# Patient Record
Sex: Female | Born: 1970 | Race: Black or African American | Hispanic: No | Marital: Married | Smoking: Never smoker
Health system: Southern US, Community
[De-identification: ages and names within clinical notes are randomized; demographics above are authoritative.]

## PROBLEM LIST (undated history)

## (undated) DIAGNOSIS — K219 Gastro-esophageal reflux disease without esophagitis: Secondary | ICD-10-CM

## (undated) DIAGNOSIS — I499 Cardiac arrhythmia, unspecified: Secondary | ICD-10-CM

## (undated) DIAGNOSIS — Z9889 Other specified postprocedural states: Secondary | ICD-10-CM

## (undated) DIAGNOSIS — R112 Nausea with vomiting, unspecified: Secondary | ICD-10-CM

## (undated) DIAGNOSIS — IMO0001 Reserved for inherently not codable concepts without codable children: Secondary | ICD-10-CM

## (undated) DIAGNOSIS — R000 Tachycardia, unspecified: Secondary | ICD-10-CM

## (undated) DIAGNOSIS — E785 Hyperlipidemia, unspecified: Secondary | ICD-10-CM

## (undated) DIAGNOSIS — I2699 Other pulmonary embolism without acute cor pulmonale: Secondary | ICD-10-CM

## (undated) DIAGNOSIS — Z86711 Personal history of pulmonary embolism: Secondary | ICD-10-CM

## (undated) DIAGNOSIS — R011 Cardiac murmur, unspecified: Secondary | ICD-10-CM

## (undated) DIAGNOSIS — F419 Anxiety disorder, unspecified: Secondary | ICD-10-CM

## (undated) DIAGNOSIS — D219 Benign neoplasm of connective and other soft tissue, unspecified: Secondary | ICD-10-CM

## (undated) DIAGNOSIS — J449 Chronic obstructive pulmonary disease, unspecified: Secondary | ICD-10-CM

## (undated) DIAGNOSIS — Z8759 Personal history of other complications of pregnancy, childbirth and the puerperium: Secondary | ICD-10-CM

## (undated) DIAGNOSIS — R519 Headache, unspecified: Secondary | ICD-10-CM

## (undated) DIAGNOSIS — T753XXA Motion sickness, initial encounter: Secondary | ICD-10-CM

## (undated) DIAGNOSIS — I471 Supraventricular tachycardia, unspecified: Secondary | ICD-10-CM

## (undated) DIAGNOSIS — R51 Headache: Secondary | ICD-10-CM

## (undated) DIAGNOSIS — D649 Anemia, unspecified: Secondary | ICD-10-CM

## (undated) DIAGNOSIS — Z90710 Acquired absence of both cervix and uterus: Secondary | ICD-10-CM

## (undated) HISTORY — DX: Personal history of other complications of pregnancy, childbirth and the puerperium: Z87.59

## (undated) HISTORY — PX: CHOLECYSTECTOMY: SHX55

## (undated) HISTORY — DX: Acquired absence of both cervix and uterus: Z90.710

## (undated) HISTORY — DX: Chronic obstructive pulmonary disease, unspecified: J44.9

## (undated) HISTORY — PX: NISSEN FUNDOPLICATION: SHX2091

## (undated) HISTORY — DX: Personal history of pulmonary embolism: Z86.711

## (undated) HISTORY — DX: Tachycardia, unspecified: R00.0

## (undated) HISTORY — PX: TUBAL LIGATION: SHX77

## (undated) HISTORY — DX: Gastro-esophageal reflux disease without esophagitis: K21.9

## (undated) HISTORY — DX: Benign neoplasm of connective and other soft tissue, unspecified: D21.9

## (undated) HISTORY — DX: Hyperlipidemia, unspecified: E78.5

## (undated) HISTORY — DX: Other specified postprocedural states: Z98.890

## (undated) HISTORY — DX: Cardiac murmur, unspecified: R01.1

## (undated) HISTORY — PX: CARDIAC ELECTROPHYSIOLOGY STUDY AND ABLATION: SHX1294

## (undated) HISTORY — PX: APPENDECTOMY: SHX54

---

## 1998-06-02 DIAGNOSIS — I2699 Other pulmonary embolism without acute cor pulmonale: Secondary | ICD-10-CM

## 1998-06-02 HISTORY — DX: Other pulmonary embolism without acute cor pulmonale: I26.99

## 2011-06-03 HISTORY — PX: COLONOSCOPY: SHX174

## 2014-03-28 DIAGNOSIS — K449 Diaphragmatic hernia without obstruction or gangrene: Secondary | ICD-10-CM

## 2014-03-28 DIAGNOSIS — K219 Gastro-esophageal reflux disease without esophagitis: Secondary | ICD-10-CM | POA: Insufficient documentation

## 2014-03-28 DIAGNOSIS — E782 Mixed hyperlipidemia: Secondary | ICD-10-CM | POA: Insufficient documentation

## 2014-12-14 DIAGNOSIS — Z9889 Other specified postprocedural states: Secondary | ICD-10-CM

## 2014-12-14 HISTORY — DX: Other specified postprocedural states: Z98.890

## 2015-05-09 ENCOUNTER — Ambulatory Visit (INDEPENDENT_AMBULATORY_CARE_PROVIDER_SITE_OTHER): Payer: BLUE CROSS/BLUE SHIELD | Admitting: Obstetrics and Gynecology

## 2015-05-09 ENCOUNTER — Encounter: Payer: Self-pay | Admitting: Obstetrics and Gynecology

## 2015-05-09 VITALS — BP 108/68 | HR 74 | Ht 66.0 in | Wt 199.9 lb

## 2015-05-09 DIAGNOSIS — Z98891 History of uterine scar from previous surgery: Secondary | ICD-10-CM

## 2015-05-09 DIAGNOSIS — Z86711 Personal history of pulmonary embolism: Secondary | ICD-10-CM

## 2015-05-09 DIAGNOSIS — N841 Polyp of cervix uteri: Secondary | ICD-10-CM | POA: Diagnosis not present

## 2015-05-09 DIAGNOSIS — N939 Abnormal uterine and vaginal bleeding, unspecified: Secondary | ICD-10-CM

## 2015-05-09 DIAGNOSIS — Z8759 Personal history of other complications of pregnancy, childbirth and the puerperium: Secondary | ICD-10-CM

## 2015-05-09 DIAGNOSIS — O3430 Maternal care for cervical incompetence, unspecified trimester: Secondary | ICD-10-CM | POA: Diagnosis not present

## 2015-05-09 DIAGNOSIS — D259 Leiomyoma of uterus, unspecified: Secondary | ICD-10-CM | POA: Diagnosis not present

## 2015-05-09 NOTE — Patient Instructions (Signed)
1. Endometrial biopsy is done today 2. Endocervical polyp is removed today 3. Pelvic ultrasound is ordered. 4. Return in 1 week after her ultrasound for further management planning 5. Take Tylenol as needed for cramps

## 2015-05-09 NOTE — Progress Notes (Signed)
Patient ID: Brittany Baldwin, female   DOB: August 29, 1970, 44 y.o.   MRN: [id number] np - spotting in between cycle   cycles are q 24 days, lasting 3d, no day is heavy S/p novasure ablation- 2013 H/o fibroids Pelvic pain comes and goes  GYN ENCOUNTER NOTE  Subjective:       Brittany Baldwin is a 44 y.o. [phone] female is here for gynecologic evaluation of the following issues:  1. Abnormal uterine bleeding (Intramenstrual bleeding-spotting)  2. History of uterine fibroids 3. Pelvic cramping 4. History of NovaSure ablation 2013  Patient is a 44 year old married African-American female para 1251, with history of uterine fibroids, history of uterine ablation in 2013 with NovaSure, status post BTL for contraception, presents for evaluation abnormal uterine bleeding and pelvic pain.  Past GYN history: Menarche age 59. Intervals R every 24 days Duration of flow 3 days Intramenstrual spotting present. Pelvic cramping is intermittent throughout cycle History of deep thrusting dyspareunia. Pelvic pain description: Random cramping throughout cycle. Improvement occurs with lying down or use of heating pad. Tylenol helps. Patient unable to take nonsteroidal anti-inflammatory medications. History of postcoital bleeding and deep thrusting dyspareunia. No history of abnormal Pap smears History of McDonald's cerclage 4 and Shirodkar cerclage 1; Shirodkar cerclage remains intact Past history of pulmonary embolus during pregnancy which required bed rest due to incompetent cervix   Obstetric History OB History  Gravida Para Term Preterm AB SAB TAB Ectopic Multiple Living  8 3 1 2 5 4 1   1     # Outcome Date GA Lbr Len/2nd Weight Sex Delivery Anes PTL Lv  8 SAB 2003          7 Preterm 2001        FD  6 Term 1998   4 lb 2.1 oz (1.873 kg) F Vag-Spont   Y  5 Preterm 1996        FD  4 SAB 1994          3 SAB 1992          2 TAB 1991          1 SAB 1990              Past Medical History  Diagnosis  Date  . Tachycardia   . Hyperlipemia   . GERD (gastroesophageal reflux disease)   . Fibroid     Past Surgical History  Procedure Laterality Date  . Tubal ligation    . Cesarean section    . Appendectomy    . Cholecystectomy    . Cervical cerclage    . Nissen fundoplication    . Fibroids removed    . Novasure ablation      No current outpatient prescriptions on file prior to visit.   No current facility-administered medications on file prior to visit.    Allergies  Allergen Reactions  . Codeine Shortness Of Breath  . Hydrocodone-Acetaminophen Rash  . Aspirin Other (See Comments)    Stomach issues  . Diphenhydramine Hcl Other (See Comments)    Through an I.V. drip    Social History   Social History  . Marital Status: Married    Spouse Name: N/A  . Number of Children: N/A  . Years of Education: N/A   Occupational History  . Not on file.   Social History Main Topics  . Smoking status: Never Smoker   . Smokeless tobacco: Not on file  . Alcohol Use: No  . Drug Use: No  .  Sexual Activity: Yes    Birth Control/ Protection: Surgical   Other Topics Concern  . Not on file   Social History Narrative  . No narrative on file    Family History  Problem Relation Age of Onset  . Diabetes Mother   . Prostate cancer Father   . Congestive Heart Failure Father   . Cancer Neg Hx     The following portions of the patient's history were reviewed and updated as appropriate: allergies, current medications, past family history, past medical history, past social history, past surgical history and problem list.  Review of Systems Review of Systems - General ROS: negative for - chills, fatigue, fever, hot flashes, malaise or night sweats Hematological and Lymphatic ROS: negative for - bleeding problems or swollen lymph nodes Gastrointestinal ROS: negative for - , blood in stools, change in bowel habits and nausea/vomiting. POSITIVE-abdominal pain Musculoskeletal ROS:  negative for - joint pain, muscle pain or muscular weakness Genito-Urinary ROS: negative for - change in menstrual cycle, dysmenorrhea, , dysuria, genital discharge, genital ulcers, hematuria, incontinence, nocturia or pelvic pain.  POSITIVE-dyspareunia, irregular menses,,   Objective:   BP 108/68 mmHg  Pulse 74  Ht 5\' 6"  (1.676 m)  Wt 199 lb 14.4 oz (90.674 kg)  BMI 32.28 kg/m2  LMP 04/21/2015 (Exact Date) CONSTITUTIONAL: Well-developed, well-nourished female in no acute distress.  HENT:  Normocephalic, atraumatic.  NECK: Normal range of motion, supple, no masses.  Normal thyroid.  SKIN: Skin is warm and dry. No rash noted. Not diaphoretic. No erythema. No pallor. NEUROLGIC: Alert and oriented to person, place, and time. PSYCHIATRIC: Normal mood and affect. Normal behavior. Normal judgment and thought content. CARDIOVASCULAR:Not Examined RESPIRATORY: Not Examined BREASTS: Not Examined ABDOMEN: Soft, non distended; Non tender.  No Organomegaly. PELVIC:  External Genitalia: Normal  BUS: Normal  Vagina: Normal  Cervix: Fleshy endocervical polyp 7 mm (removed); irregular scarred cervix without cervical motion tenderness  Uterus: Top Normal size, slightly tender 2/4, mobile  Adnexa: Normal  RV: Normal external exam  Bladder: Nontender MUSCULOSKELETAL: Normal range of motion. No tenderness.  No cyanosis, clubbing, or edema.  PROCEDURES: 1.  Cervical biopsy (endocervical polyp removed with Tischler forceps). 2.  Endometrial biopsy-see note below  Endometrial Biopsy Procedure Note  Pre-operative Diagnosis: Abnormal uterine bleeding  Post-operative Diagnosis: same  Procedure Details   Urine pregnancy test was not done.  The risks (including infection, bleeding, pain, and uterine perforation) and benefits of the procedure were explained to the patient and Verbal informed consent was obtained.  Antibiotic prophylaxis against endocarditis was not indicated.   The patient was placed  in the dorsal lithotomy position.  Bimanual exam showed the uterus to be in the neutral position.  A Graves' speculum inserted in the vagina, and the cervix prepped with povidone iodine.  Endocervical curettage with a Kevorkian curette was not performed.   A sharp tenaculum was not  applied to the anterior lip of the cervix for stabilization.  A sterile uterine sound was used to sound the uterus to a depth of 6cm.  A Mylex 3mm curette was used to sample the endometrium.  Sample was sent for pathologic examination.  Condition: Stable  Complications: None  Plan:  The patient was advised to call for any fever or for prolonged or severe pain or bleeding. She was advised to use OTC acetaminophen as needed for mild to moderate pain. She was advised to avoid vaginal intercourse for 48 hours or until the bleeding has completely stopped.  Attending Physician Documentation: Herold Harms, MD             Assessment:  1. Abnormal uterine bleeding  - US Pelvis Complete; Future - US Transvaginal Non-OB; Future - Pathology  2. Endocervical polyp   - Pathology  3. Uterine leiomyoma, unspecified location  4.  Complicated gynecologic history with 4 McDonald's cerclages in the past and Shirodkar cerclage, which is still intact.  5.  History of pulmonary embolus during pregnancy while on prolonged bedrest due to cervical incompetence and history of premature birth.  Not a candidate for hormonal therapy to regulate bleeding       Plan:   1. Endometrial biopsy performed 2. Cervical biopsy for removal of endocervical polyp 3. Pelvic ultrasound 4. Return in 1 week after ultrasound for further management planning 5. Options of management for abnormal uterine bleeding and pelvic cramping were reviewed. Patient understands that she is not a candidate for hormone therapy due to her past history of pulmonary embolus during pregnancy. It appears that her only optimal intervention  would be hysterectomy. This will be addressed once all information is obtained from testing.  Brittany Docker Janai Maudlin, MD  A total of 45 minutes were spent face-to-face with the patient during the encounter with greater than 50% dealing with counseling and coordination of care.

## 2015-05-10 ENCOUNTER — Encounter: Payer: Self-pay | Admitting: Obstetrics and Gynecology

## 2015-05-10 DIAGNOSIS — D219 Benign neoplasm of connective and other soft tissue, unspecified: Secondary | ICD-10-CM | POA: Insufficient documentation

## 2015-05-10 DIAGNOSIS — R000 Tachycardia, unspecified: Secondary | ICD-10-CM | POA: Insufficient documentation

## 2015-05-10 DIAGNOSIS — Z98891 History of uterine scar from previous surgery: Secondary | ICD-10-CM | POA: Insufficient documentation

## 2015-05-10 DIAGNOSIS — O3430 Maternal care for cervical incompetence, unspecified trimester: Secondary | ICD-10-CM | POA: Insufficient documentation

## 2015-05-10 DIAGNOSIS — N939 Abnormal uterine and vaginal bleeding, unspecified: Secondary | ICD-10-CM | POA: Insufficient documentation

## 2015-05-11 LAB — PATHOLOGY

## 2015-05-14 LAB — PATHOLOGY

## 2015-05-16 ENCOUNTER — Encounter: Payer: Self-pay | Admitting: Obstetrics and Gynecology

## 2015-05-16 ENCOUNTER — Ambulatory Visit (INDEPENDENT_AMBULATORY_CARE_PROVIDER_SITE_OTHER): Payer: BLUE CROSS/BLUE SHIELD

## 2015-05-16 ENCOUNTER — Ambulatory Visit (INDEPENDENT_AMBULATORY_CARE_PROVIDER_SITE_OTHER): Payer: BLUE CROSS/BLUE SHIELD | Admitting: Obstetrics and Gynecology

## 2015-05-16 VITALS — BP 122/75 | HR 71 | Ht 66.0 in | Wt 196.0 lb

## 2015-05-16 DIAGNOSIS — O3430 Maternal care for cervical incompetence, unspecified trimester: Secondary | ICD-10-CM

## 2015-05-16 DIAGNOSIS — D259 Leiomyoma of uterus, unspecified: Secondary | ICD-10-CM | POA: Diagnosis not present

## 2015-05-16 DIAGNOSIS — N841 Polyp of cervix uteri: Secondary | ICD-10-CM

## 2015-05-16 DIAGNOSIS — N939 Abnormal uterine and vaginal bleeding, unspecified: Secondary | ICD-10-CM

## 2015-05-16 DIAGNOSIS — N949 Unspecified condition associated with female genital organs and menstrual cycle: Secondary | ICD-10-CM | POA: Diagnosis not present

## 2015-05-16 DIAGNOSIS — G8929 Other chronic pain: Secondary | ICD-10-CM

## 2015-05-16 DIAGNOSIS — Z8759 Personal history of other complications of pregnancy, childbirth and the puerperium: Secondary | ICD-10-CM

## 2015-05-16 DIAGNOSIS — Z86711 Personal history of pulmonary embolism: Secondary | ICD-10-CM

## 2015-05-16 DIAGNOSIS — R102 Pelvic and perineal pain: Secondary | ICD-10-CM

## 2015-05-16 HISTORY — DX: Personal history of other complications of pregnancy, childbirth and the puerperium: Z87.59

## 2015-05-16 HISTORY — DX: Personal history of pulmonary embolism: Z86.711

## 2015-05-16 NOTE — Patient Instructions (Signed)
1.  Options of management were reviewed: Expectant observation; trial of Depo-Lupron therapy; TAH, bilateral salpingectomy. 2.  Patient is to contact us regarding her decision for therapy.  She will discuss options with her husband and family before contacting us.

## 2015-05-16 NOTE — Progress Notes (Signed)
Patient ID: Brittany Baldwin, female   DOB: March 14, 1971, 44 y.o.   MRN: [id number] Results- u/s,emb, and polyp removed  Chief complaint: 1.   Management planning conference. 2.  Uterine fibroids. 3.  Abnormal uterine bleeding. 4.  Status post endometrial biopsy and cervical polyp removal. 5.  History of ablation. 6.  History of multiple abdominal surgeries, (McDonald cerclage 4, Shirodkar cerclage 1, hysterotomy for fetal demise-midline, and low transverse cesarean section. 7.  Chronic pelvic pain 8.  History of pulmonary embolus; not a hormonal therapy candidate  Patient presents for follow-up on above issues. Endometrial biopsy: Benign endometrium.  No evidence of hyperplasia or carcinoma. Cervical biopsy: Endocervical polyp. Ultrasound: Multiple uterine fibroids (6); endometrial stripe obscured by fibroids.  Options of management were discussed For dealing with abnormal uterine bleeding, polymenorrhea, and chronic pelvic pain including deep thrusting dyspareunia; NOT a hormonal therapy candidate. 1.  Trial of Depo-Lupron for bleeding suppression, pain suppression, and fibroid shrinkage. 2.  TAH, bilateral salpingectomy. 3.  Expectant observation until menopause with hopeful resolution of symptomatology.  Patient will contemplate options, discuss alternatives with spouse, and she will contact us with her decision.  At this time she is leaning towards probable surgery.  A total of 25 minutes were spent face-to-face with the patient during this encounter and over half of that time involved counseling and coordination of care.  Brayton Mars, MD

## 2015-06-25 ENCOUNTER — Encounter: Payer: Self-pay | Admitting: *Deleted

## 2015-06-25 ENCOUNTER — Inpatient Hospital Stay: Admission: RE | Admit: 2015-06-25 | Payer: Self-pay | Source: Ambulatory Visit

## 2015-06-25 DIAGNOSIS — I471 Supraventricular tachycardia: Secondary | ICD-10-CM | POA: Diagnosis not present

## 2015-06-25 NOTE — Patient Instructions (Signed)
  Your procedure is scheduled on: 07-02-15 (MONDAY) Report to [facility] To find out your arrival time please call [phone] between 1PM - 3PM on 06-29-15 (FRIDAY)  Remember: Instructions that are not followed completely may result in serious medical risk, up to and including death, or upon the discretion of your surgeon and anesthesiologist your surgery may need to be rescheduled.    _X___ 1. Do not eat food or drink liquids after midnight. No gum chewing or hard candies.     _X___ 2. No Alcohol for 24 hours before or after surgery.   ____ 3. Bring all medications with you on the day of surgery if instructed.    _X___ 4. Notify your doctor if there is any change in your medical condition     (cold, fever, infections).     Do not wear jewelry, make-up, hairpins, clips or nail polish.  Do not wear lotions, powders, or perfumes. You may wear deodorant.  Do not shave 48 hours prior to surgery. Men may shave face and neck.  Do not bring valuables to the hospital.    [facility] is not responsible for any belongings or valuables.               Contacts, dentures or bridgework may not be worn into surgery.  Leave your suitcase in the car. After surgery it may be brought to your room.  For patients admitted to the hospital, discharge time is determined by your treatment team.   Patients discharged the day of surgery will not be allowed to drive home.   Please read over the following fact sheets that you were given:    _X___ Take these medicines the morning of surgery with A SIP OF WATER:    1. PROPRANOLOL  2.   3.   4.  5.  6.  ____ Fleet Enema (as directed)   _X___ Use CHG Soap as directed  ____ Use inhalers on the day of surgery  ____ Stop metformin 2 days prior to surgery    ____ Take 1/2 of usual insulin dose the night before surgery and none on the morning of surgery.   ____ Stop Coumadin/Plavix/aspirin-N/A  ____ Stop  Anti-inflammatories-NO NSAIDS OR ASPIRIN PRODUCTS-TYLENOL OK TO TAKE   _X___ Stop supplements until after surgery-STOP BIOTIN NOW  ____ Bring C-Pap to the hospital.

## 2015-06-26 ENCOUNTER — Encounter: Payer: Self-pay | Admitting: Obstetrics and Gynecology

## 2015-06-26 ENCOUNTER — Ambulatory Visit (INDEPENDENT_AMBULATORY_CARE_PROVIDER_SITE_OTHER): Payer: BLUE CROSS/BLUE SHIELD | Admitting: Obstetrics and Gynecology

## 2015-06-26 ENCOUNTER — Encounter
Admission: RE | Admit: 2015-06-26 | Discharge: 2015-06-26 | Disposition: A | Discharge status: Home / Self Care | Payer: BLUE CROSS/BLUE SHIELD | Source: Ambulatory Visit | Attending: Obstetrics and Gynecology | Admitting: Obstetrics and Gynecology

## 2015-06-26 VITALS — BP 122/74 | HR 78 | Ht 66.0 in | Wt 200.9 lb

## 2015-06-26 DIAGNOSIS — Z01812 Encounter for preprocedural laboratory examination: Secondary | ICD-10-CM | POA: Insufficient documentation

## 2015-06-26 DIAGNOSIS — O3430 Maternal care for cervical incompetence, unspecified trimester: Secondary | ICD-10-CM

## 2015-06-26 DIAGNOSIS — D259 Leiomyoma of uterus, unspecified: Secondary | ICD-10-CM

## 2015-06-26 DIAGNOSIS — Z01818 Encounter for other preprocedural examination: Secondary | ICD-10-CM | POA: Insufficient documentation

## 2015-06-26 DIAGNOSIS — Z98891 History of uterine scar from previous surgery: Secondary | ICD-10-CM

## 2015-06-26 DIAGNOSIS — G8929 Other chronic pain: Secondary | ICD-10-CM

## 2015-06-26 DIAGNOSIS — N949 Unspecified condition associated with female genital organs and menstrual cycle: Secondary | ICD-10-CM

## 2015-06-26 DIAGNOSIS — R102 Pelvic and perineal pain: Secondary | ICD-10-CM

## 2015-06-26 DIAGNOSIS — N939 Abnormal uterine and vaginal bleeding, unspecified: Secondary | ICD-10-CM

## 2015-06-26 LAB — CBC WITH DIFFERENTIAL/PLATELET
BASOS PCT: 1 %
Basophils Absolute: 0.1 10*3/uL (ref 0–0.1)
EOS ABS: 0.1 10*3/uL (ref 0–0.7)
EOS PCT: 1 %
HCT: 39.5 % (ref 35.0–47.0)
Hemoglobin: 13 g/dL (ref 12.0–16.0)
Lymphocytes Relative: 30 %
Lymphs Abs: 1.9 10*3/uL (ref 1.0–3.6)
MCH: 29.1 pg (ref 26.0–34.0)
MCHC: 33 g/dL (ref 32.0–36.0)
MCV: 88 fL (ref 80.0–100.0)
MONO ABS: 0.8 10*3/uL (ref 0.2–0.9)
MONOS PCT: 12 %
Neutro Abs: 3.6 10*3/uL (ref 1.4–6.5)
Neutrophils Relative %: 56 %
PLATELETS: 191 10*3/uL (ref 150–440)
RBC: 4.49 MIL/uL (ref 3.80–5.20)
RDW: 13.9 % (ref 11.5–14.5)
WBC: 6.4 10*3/uL (ref 3.6–11.0)

## 2015-06-26 LAB — RAPID HIV SCREEN (HIV 1/2 AB+AG)
HIV 1/2 ANTIBODIES: NONREACTIVE
HIV-1 P24 ANTIGEN - HIV24: NONREACTIVE

## 2015-06-26 LAB — TYPE AND SCREEN
ABO/RH(D): A POS
ANTIBODY SCREEN: NEGATIVE

## 2015-06-26 LAB — ABO/RH: ABO/RH(D): A POS

## 2015-06-26 NOTE — H&P (Signed)
Subjective:    Patient is a 45 y.o. G8P1270female scheduled for Total abdominal hysterectomy and bilateral salpingectomy. Indications for procedure are Abnormal uterine bleeding; chronic pelvic pain.   Pertinent Gynecological History: History of multiple gynecologic surgeries including: 1.  McDonald cerclage 4. 2.  Shirodkar cerclage. 3.  Hysterotomy for UFD. 4.  Cesarean section delivery 5.  BTL. 6.  NovaSure endometrial ablation  Menses: Irregular  Discussed Blood/Blood Products: yes   Menstrual History: OB History    Gravida Para Term Preterm AB TAB SAB Ectopic Multiple Living   8 3 1 2 5 1 4   1       Menarche age: NA  Patient's last menstrual period was 06/26/2015 (exact date).    Past Medical History  Diagnosis Date  . Tachycardia   . Hyperlipemia   . GERD (gastroesophageal reflux disease)   . Fibroid   . Headache     H/O  . Anemia     H/O   . Dysrhythmia     SVT-HAD CARDIAC ABLATION DONE IN 2016 BUT PT STATES SHE STILL GETS TACHYCARDIC AND IS SYPMPTOMATIC WITH SOB, DIZZINESS DURING TACHY EPISODES (06-25-15)  . Shortness of breath dyspnea     ASSOCIATED WITH TACHYCARDIA    Past Surgical History  Procedure Laterality Date  . Tubal ligation    . Cesarean section    . Appendectomy    . Cholecystectomy    . Cervical cerclage      McDonald cerclage 4; Shirodkar cerclage 1  . Nissen fundoplication    . Fibroids removed    . Novasure ablation    . Dilation and curettage of uterus    . Cardiac electrophysiology study and ablation      OB History  Gravida Para Term Preterm AB SAB TAB Ectopic Multiple Living  8 3 1 2 5 4 1   1     # Outcome Date GA Lbr Len/2nd Weight Sex Delivery Anes PTL Lv  8 SAB 2003          7 Preterm 2001        FD  6 Term 1998   4 lb 2.1 oz (1.873 kg) F Vag-Spont   Y  5 Preterm 1996        FD  4 SAB 1994          3 SAB 1992          2 TAB 1991          1 SAB 1990              Social History   Social History  . Marital  Status: Married    Spouse Name: N/A  . Number of Children: N/A  . Years of Education: N/A   Social History Main Topics  . Smoking status: Never Smoker   . Smokeless tobacco: None  . Alcohol Use: No  . Drug Use: No  . Sexual Activity: Yes    Birth Control/ Protection: Surgical   Other Topics Concern  . None   Social History Narrative    Family History  Problem Relation Age of Onset  . Diabetes Mother   . Prostate cancer Father   . Congestive Heart Failure Father   . Cancer Neg Hx      (Not in a hospital admission)  Allergies  Allergen Reactions  . Codeine Shortness Of Breath  . Hydrocodone-Acetaminophen Rash  . Adhesive [Tape]     ELECTRODES FROM HOLTER MONITOR CAUSED RASH  . Aspirin  Other (See Comments)    Stomach issues  . Diphenhydramine Hcl Other (See Comments)    Through an I.V. drip    Review of Systems Constitutional: No recent fever/chills/sweats Respiratory: No recent cough/bronchitis Cardiovascular: No chest pain Gastrointestinal: No recent nausea/vomiting/diarrhea Genitourinary: No UTI symptoms Hematologic/lymphatic: history of coagulopathy or recent blood thinner use    Objective:    BP 122/74 mmHg  Pulse 78  Ht 5\' 6"  (1.676 m)  Wt 200 lb 14.4 oz (91.128 kg)  BMI 32.44 kg/m2  LMP 06/26/2015 (Exact Date)  General:   Normal  Skin:   normal  HEENT:  Normal  Neck:  Supple without Adenopathy or Thyromegaly  Lungs:   Heart:              Breasts:   Abdomen:  Pelvis:  M/S   Extremeties:  Neuro:    clear to auscultation bilaterally   Normal without murmur   Not Examined   soft, non-tender; bowel sounds normal; no masses,  no organomegaly   Exam deferred to OR  No CVAT  Warm/Dry   Normal          Assessment:    1.  Chronic pelvic pain. 2.  Abnormal uterine bleeding   Plan:  Total abdominal hysterectomy with bilateral salpingectomy Heparin prophylaxis due to history of PE  Preop counseling: The patient is to undergo total  abdominal hysterectomy with bilateral salpingectomy.  She is understanding of the planned procedure and is aware of and is accepting of all surgical risks which include but are not limited to bleeding, infection, pelvic organ injury with need for repair, blood clot disorders, anesthesia risks, etc.  All questions have been answered.  Informed consent is given.  Patient is ready and willing to peed with surgery as scheduled.  Brayton Mars, MD  Note: This dictation was prepared with Dragon dictation along with smaller phrase technology. Any transcriptional errors that result from this process are unintentional.

## 2015-06-26 NOTE — Progress Notes (Signed)
Subjective:    Patient is a 45 y.o. G8P1241female scheduled for Total abdominal hysterectomy and bilateral salpingectomy. Indications for procedure are Abnormal uterine bleeding; chronic pelvic pain.   Pertinent Gynecological History: History of multiple gynecologic surgeries including: 1.  McDonald cerclage 4. 2.  Shirodkar cerclage. 3.  Hysterotomy for UFD. 4.  Cesarean section delivery 5.  BTL. 6.  NovaSure endometrial ablation  Menses: Irregular  Discussed Blood/Blood Products: yes   Menstrual History: OB History    Gravida Para Term Preterm AB TAB SAB Ectopic Multiple Living   8 3 1 2 5 1 4   1       Menarche age: NA  Patient's last menstrual period was 06/26/2015 (exact date).    Past Medical History  Diagnosis Date  . Tachycardia   . Hyperlipemia   . GERD (gastroesophageal reflux disease)   . Fibroid   . Headache     H/O  . Anemia     H/O   . Dysrhythmia     SVT-HAD CARDIAC ABLATION DONE IN 2016 BUT PT STATES SHE STILL GETS TACHYCARDIC AND IS SYPMPTOMATIC WITH SOB, DIZZINESS DURING TACHY EPISODES (06-25-15)  . Shortness of breath dyspnea     ASSOCIATED WITH TACHYCARDIA    Past Surgical History  Procedure Laterality Date  . Tubal ligation    . Cesarean section    . Appendectomy    . Cholecystectomy    . Cervical cerclage      McDonald cerclage 4; Shirodkar cerclage 1  . Nissen fundoplication    . Fibroids removed    . Novasure ablation    . Dilation and curettage of uterus    . Cardiac electrophysiology study and ablation      OB History  Gravida Para Term Preterm AB SAB TAB Ectopic Multiple Living  8 3 1 2 5 4 1   1     # Outcome Date GA Lbr Len/2nd Weight Sex Delivery Anes PTL Lv  8 SAB 2003          7 Preterm 2001        FD  6 Term 1998   4 lb 2.1 oz (1.873 kg) F Vag-Spont   Y  5 Preterm 1996        FD  4 SAB 1994          3 SAB 1992          2 TAB 1991          1 SAB 1990              Social History   Social History  . Marital  Status: Married    Spouse Name: N/A  . Number of Children: N/A  . Years of Education: N/A   Social History Main Topics  . Smoking status: Never Smoker   . Smokeless tobacco: None  . Alcohol Use: No  . Drug Use: No  . Sexual Activity: Yes    Birth Control/ Protection: Surgical   Other Topics Concern  . None   Social History Narrative    Family History  Problem Relation Age of Onset  . Diabetes Mother   . Prostate cancer Father   . Congestive Heart Failure Father   . Cancer Neg Hx      (Not in a hospital admission)  Allergies  Allergen Reactions  . Codeine Shortness Of Breath  . Hydrocodone-Acetaminophen Rash  . Adhesive [Tape]     ELECTRODES FROM HOLTER MONITOR CAUSED RASH  . Aspirin  Other (See Comments)    Stomach issues  . Diphenhydramine Hcl Other (See Comments)    Through an I.V. drip    Review of Systems Constitutional: No recent fever/chills/sweats Respiratory: No recent cough/bronchitis Cardiovascular: No chest pain Gastrointestinal: No recent nausea/vomiting/diarrhea Genitourinary: No UTI symptoms Hematologic/lymphatic: history of coagulopathy or recent blood thinner use    Objective:    BP 122/74 mmHg  Pulse 78  Ht 5\' 6"  (1.676 m)  Wt 200 lb 14.4 oz (91.128 kg)  BMI 32.44 kg/m2  LMP 06/26/2015 (Exact Date)  General:   Normal  Skin:   normal  HEENT:  Normal  Neck:  Supple without Adenopathy or Thyromegaly  Lungs:   Heart:              Breasts:   Abdomen:  Pelvis:  M/S   Extremeties:  Neuro:    clear to auscultation bilaterally   Normal without murmur   Not Examined   soft, non-tender; bowel sounds normal; no masses,  no organomegaly   Exam deferred to OR  No CVAT  Warm/Dry   Normal          Assessment:    1.  Chronic pelvic pain. 2.  Abnormal uterine bleeding   Plan:  Total abdominal hysterectomy with bilateral salpingectomy Heparin prophylaxis due to history of PE  Preop counseling: The patient is to undergo total  abdominal hysterectomy with bilateral salpingectomy.  She is understanding of the planned procedure and is aware of and is accepting of all surgical risks which include but are not limited to bleeding, infection, pelvic organ injury with need for repair, blood clot disorders, anesthesia risks, etc.  All questions have been answered.  Informed consent is given.  Patient is ready and willing to peed with surgery as scheduled.  Brayton Mars, MD  Note: This dictation was prepared with Dragon dictation along with smaller phrase technology. Any transcriptional errors that result from this process are unintentional.

## 2015-06-27 LAB — RPR: RPR Ser Ql: REACTIVE — AB

## 2015-06-27 LAB — RPR, QUANT+TP ABS (REFLEX)
Rapid Plasma Reagin, Quant: 1:4 {titer} — ABNORMAL HIGH
T Pallidum Abs: NEGATIVE

## 2015-06-28 ENCOUNTER — Encounter: Payer: Self-pay | Admitting: *Deleted

## 2015-06-28 NOTE — Pre-Procedure Instructions (Signed)
FAXED +RPR RESULTS TO DR DEFRAN'S OFFICE WITH FAX CONFIRMATION RECIEVED

## 2015-06-28 NOTE — Pre-Procedure Instructions (Signed)
CALLED OVER AND SPOKE WITH TINA IN DR DEFRANS OFFICE AND INFORMED HER OF THE +RPR RESULT AND THAT I DID FAX IT OVER.  SHE SAID THAT SHE WOULD CHECK AND MAKE SURE IT CAME ACROSS ON FAX AND THAT SHE WOULD LET DR [facility] KNOW OF + RESULT

## 2015-06-29 ENCOUNTER — Encounter: Payer: Self-pay | Admitting: *Deleted

## 2015-07-02 ENCOUNTER — Inpatient Hospital Stay: Payer: BLUE CROSS/BLUE SHIELD | Admitting: Anesthesiology

## 2015-07-02 ENCOUNTER — Inpatient Hospital Stay
Admission: RE | Admit: 2015-07-02 | Discharge: 2015-07-05 | DRG: 743 | Disposition: A | Discharge status: Home / Self Care | Payer: BLUE CROSS/BLUE SHIELD | Source: Ambulatory Visit | Attending: Obstetrics and Gynecology | Admitting: Obstetrics and Gynecology

## 2015-07-02 ENCOUNTER — Encounter: Payer: Self-pay | Admitting: Emergency Medicine

## 2015-07-02 ENCOUNTER — Encounter
Admission: RE | Disposition: A | Discharge status: Home / Self Care | Payer: Self-pay | Source: Ambulatory Visit | Attending: Obstetrics and Gynecology

## 2015-07-02 DIAGNOSIS — Z9049 Acquired absence of other specified parts of digestive tract: Secondary | ICD-10-CM | POA: Diagnosis not present

## 2015-07-02 DIAGNOSIS — N939 Abnormal uterine and vaginal bleeding, unspecified: Secondary | ICD-10-CM | POA: Diagnosis present

## 2015-07-02 DIAGNOSIS — Z888 Allergy status to other drugs, medicaments and biological substances status: Secondary | ICD-10-CM

## 2015-07-02 DIAGNOSIS — Z886 Allergy status to analgesic agent status: Secondary | ICD-10-CM

## 2015-07-02 DIAGNOSIS — N736 Female pelvic peritoneal adhesions (postinfective): Secondary | ICD-10-CM | POA: Diagnosis present

## 2015-07-02 DIAGNOSIS — Z90710 Acquired absence of both cervix and uterus: Secondary | ICD-10-CM

## 2015-07-02 DIAGNOSIS — R102 Pelvic and perineal pain: Secondary | ICD-10-CM

## 2015-07-02 DIAGNOSIS — Z8249 Family history of ischemic heart disease and other diseases of the circulatory system: Secondary | ICD-10-CM | POA: Diagnosis not present

## 2015-07-02 DIAGNOSIS — Z809 Family history of malignant neoplasm, unspecified: Secondary | ICD-10-CM | POA: Diagnosis not present

## 2015-07-02 DIAGNOSIS — Z9889 Other specified postprocedural states: Secondary | ICD-10-CM

## 2015-07-02 DIAGNOSIS — D259 Leiomyoma of uterus, unspecified: Secondary | ICD-10-CM | POA: Diagnosis present

## 2015-07-02 DIAGNOSIS — N809 Endometriosis, unspecified: Principal | ICD-10-CM | POA: Diagnosis present

## 2015-07-02 DIAGNOSIS — K219 Gastro-esophageal reflux disease without esophagitis: Secondary | ICD-10-CM | POA: Diagnosis present

## 2015-07-02 DIAGNOSIS — G8929 Other chronic pain: Secondary | ICD-10-CM | POA: Diagnosis not present

## 2015-07-02 DIAGNOSIS — Z833 Family history of diabetes mellitus: Secondary | ICD-10-CM

## 2015-07-02 DIAGNOSIS — Z9851 Tubal ligation status: Secondary | ICD-10-CM

## 2015-07-02 DIAGNOSIS — Z885 Allergy status to narcotic agent status: Secondary | ICD-10-CM | POA: Diagnosis not present

## 2015-07-02 HISTORY — DX: Anemia, unspecified: D64.9

## 2015-07-02 HISTORY — DX: Headache, unspecified: R51.9

## 2015-07-02 HISTORY — PX: CYSTOSCOPY: SHX5120

## 2015-07-02 HISTORY — DX: Reserved for inherently not codable concepts without codable children: IMO0001

## 2015-07-02 HISTORY — DX: Anxiety disorder, unspecified: F41.9

## 2015-07-02 HISTORY — DX: Headache: R51

## 2015-07-02 HISTORY — DX: Cardiac arrhythmia, unspecified: I49.9

## 2015-07-02 HISTORY — PX: ABDOMINAL HYSTERECTOMY: SHX81

## 2015-07-02 HISTORY — DX: Acquired absence of both cervix and uterus: Z90.710

## 2015-07-02 LAB — POCT PREGNANCY, URINE: Preg Test, Ur: NEGATIVE

## 2015-07-02 SURGERY — HYSTERECTOMY, ABDOMINAL
Anesthesia: General | Site: Abdomen | Laterality: Bilateral | Wound class: Clean Contaminated

## 2015-07-02 MED ORDER — NALOXONE HCL 0.4 MG/ML IJ SOLN: 0.4000 mg | INTRAMUSCULAR | Status: DC | PRN

## 2015-07-02 MED ORDER — FENTANYL CITRATE (PF) 100 MCG/2ML IJ SOLN
25.0000 ug | INTRAMUSCULAR | Status: DC | PRN
  Administered 2015-07-02 (×4): 25 ug via INTRAVENOUS

## 2015-07-02 MED ORDER — DOCUSATE SODIUM 100 MG PO CAPS
100.0000 mg | ORAL_CAPSULE | Freq: Two times a day (BID) | ORAL | Status: DC
  Administered 2015-07-02 – 2015-07-05 (×6): 100 mg via ORAL
  Filled 2015-07-02 (×6): qty 1

## 2015-07-02 MED ORDER — MORPHINE SULFATE 2 MG/ML IV SOLN
INTRAVENOUS | Status: DC
  Administered 2015-07-02: 12:00:00 via INTRAVENOUS
  Filled 2015-07-02: qty 25

## 2015-07-02 MED ORDER — DEXAMETHASONE SODIUM PHOSPHATE 10 MG/ML IJ SOLN
INTRAMUSCULAR | Status: DC | PRN
  Administered 2015-07-02: 10 mg via INTRAVENOUS

## 2015-07-02 MED ORDER — FENTANYL CITRATE (PF) 100 MCG/2ML IJ SOLN
INTRAMUSCULAR | Status: DC | PRN
  Administered 2015-07-02: 100 ug via INTRAVENOUS
  Administered 2015-07-02 (×4): 50 ug via INTRAVENOUS

## 2015-07-02 MED ORDER — PROPOFOL 10 MG/ML IV BOLUS
INTRAVENOUS | Status: DC | PRN
  Administered 2015-07-02: 200 mg via INTRAVENOUS

## 2015-07-02 MED ORDER — LACTATED RINGERS IV SOLN
INTRAVENOUS | Status: DC
  Administered 2015-07-02 (×2): via INTRAVENOUS

## 2015-07-02 MED ORDER — LIDOCAINE-EPINEPHRINE 1 %-1:100000 IJ SOLN
INTRAMUSCULAR | Status: AC
  Filled 2015-07-02: qty 1

## 2015-07-02 MED ORDER — HEPARIN SODIUM (PORCINE) 5000 UNIT/ML IJ SOLN
5000.0000 [IU] | Freq: Two times a day (BID) | INTRAMUSCULAR | Status: DC
  Administered 2015-07-02 – 2015-07-05 (×7): 5000 [IU] via SUBCUTANEOUS
  Filled 2015-07-02 (×7): qty 1

## 2015-07-02 MED ORDER — FENTANYL CITRATE (PF) 100 MCG/2ML IJ SOLN
INTRAMUSCULAR | Status: AC
  Filled 2015-07-02: qty 2

## 2015-07-02 MED ORDER — ONDANSETRON HCL 4 MG/2ML IJ SOLN: 4.0000 mg | Freq: Four times a day (QID) | INTRAMUSCULAR | Status: DC | PRN

## 2015-07-02 MED ORDER — HEPARIN SODIUM (PORCINE) 5000 UNIT/ML IJ SOLN
5000.0000 [IU] | INTRAMUSCULAR | Status: AC
  Administered 2015-07-02: 5000 [IU] via SUBCUTANEOUS

## 2015-07-02 MED ORDER — HEPARIN SODIUM (PORCINE) 5000 UNIT/ML IJ SOLN
INTRAMUSCULAR | Status: AC
  Administered 2015-07-02: 5000 [IU] via SUBCUTANEOUS
  Filled 2015-07-02: qty 1

## 2015-07-02 MED ORDER — FAMOTIDINE 20 MG PO TABS
ORAL_TABLET | ORAL | Status: AC
  Administered 2015-07-02: 20 mg via ORAL
  Filled 2015-07-02: qty 1

## 2015-07-02 MED ORDER — BISACODYL 10 MG RE SUPP
10.0000 mg | Freq: Every day | RECTAL | Status: DC | PRN
  Administered 2015-07-04: 10 mg via RECTAL
  Filled 2015-07-02: qty 1

## 2015-07-02 MED ORDER — LIDOCAINE 5 % EX PTCH
MEDICATED_PATCH | CUTANEOUS | Status: AC
  Filled 2015-07-02: qty 1

## 2015-07-02 MED ORDER — ONDANSETRON HCL 4 MG/2ML IJ SOLN
INTRAMUSCULAR | Status: AC
  Filled 2015-07-02: qty 2

## 2015-07-02 MED ORDER — LIDOCAINE 5 % EX PTCH
MEDICATED_PATCH | CUTANEOUS | Status: DC | PRN
  Administered 2015-07-02: 1 via TRANSDERMAL

## 2015-07-02 MED ORDER — KETOROLAC TROMETHAMINE 30 MG/ML IJ SOLN
INTRAMUSCULAR | Status: DC | PRN
  Administered 2015-07-02: 30 mg via INTRAVENOUS

## 2015-07-02 MED ORDER — MIDAZOLAM HCL 2 MG/2ML IJ SOLN
INTRAMUSCULAR | Status: DC | PRN
  Administered 2015-07-02: 2 mg via INTRAVENOUS

## 2015-07-02 MED ORDER — SIMETHICONE 80 MG PO CHEW
80.0000 mg | CHEWABLE_TABLET | Freq: Four times a day (QID) | ORAL | Status: DC | PRN
  Administered 2015-07-03 – 2015-07-04 (×3): 80 mg via ORAL
  Filled 2015-07-02 (×3): qty 1

## 2015-07-02 MED ORDER — ONDANSETRON HCL 4 MG/2ML IJ SOLN
4.0000 mg | Freq: Once | INTRAMUSCULAR | Status: AC | PRN
  Administered 2015-07-02: 4 mg via INTRAVENOUS

## 2015-07-02 MED ORDER — ACETAMINOPHEN 10 MG/ML IV SOLN
1000.0000 mg | Freq: Four times a day (QID) | INTRAVENOUS | Status: AC
  Administered 2015-07-02 – 2015-07-03 (×4): 1000 mg via INTRAVENOUS
  Filled 2015-07-02 (×5): qty 100

## 2015-07-02 MED ORDER — FAMOTIDINE 20 MG PO TABS
20.0000 mg | ORAL_TABLET | Freq: Once | ORAL | Status: AC
  Administered 2015-07-02: 20 mg via ORAL

## 2015-07-02 MED ORDER — CEFAZOLIN SODIUM-DEXTROSE 2-3 GM-% IV SOLR
INTRAVENOUS | Status: AC
  Filled 2015-07-02: qty 50

## 2015-07-02 MED ORDER — FLUORESCEIN SODIUM 10 % IJ SOLN
INTRAMUSCULAR | Status: AC
  Filled 2015-07-02: qty 5

## 2015-07-02 MED ORDER — ROCURONIUM BROMIDE 100 MG/10ML IV SOLN
INTRAVENOUS | Status: DC | PRN
  Administered 2015-07-02: 40 mg via INTRAVENOUS

## 2015-07-02 MED ORDER — ONDANSETRON HCL 4 MG/2ML IJ SOLN
INTRAMUSCULAR | Status: DC | PRN
  Administered 2015-07-02: 4 mg via INTRAVENOUS

## 2015-07-02 MED ORDER — NEOSTIGMINE METHYLSULFATE 10 MG/10ML IV SOLN
INTRAVENOUS | Status: DC | PRN
  Administered 2015-07-02: 5 mg via INTRAVENOUS

## 2015-07-02 MED ORDER — SODIUM CHLORIDE 0.9% FLUSH: 9.0000 mL | INTRAVENOUS | Status: DC | PRN

## 2015-07-02 MED ORDER — PROMETHAZINE HCL 25 MG/ML IJ SOLN
25.0000 mg | Freq: Four times a day (QID) | INTRAMUSCULAR | Status: DC | PRN
  Administered 2015-07-02 – 2015-07-03 (×3): 25 mg via INTRAMUSCULAR
  Filled 2015-07-02 (×3): qty 1

## 2015-07-02 MED ORDER — LIDOCAINE HCL (CARDIAC) 20 MG/ML IV SOLN
INTRAVENOUS | Status: DC | PRN
  Administered 2015-07-02: 100 mg via INTRAVENOUS

## 2015-07-02 MED ORDER — LACTATED RINGERS IV SOLN
INTRAVENOUS | Status: DC
  Administered 2015-07-02 – 2015-07-03 (×4): via INTRAVENOUS

## 2015-07-02 MED ORDER — CEFAZOLIN SODIUM-DEXTROSE 2-3 GM-% IV SOLR
2.0000 g | INTRAVENOUS | Status: AC
  Administered 2015-07-02: 2 g via INTRAVENOUS

## 2015-07-02 MED ORDER — MORPHINE SULFATE (PF) 2 MG/ML IV SOLN
1.0000 mg | INTRAVENOUS | Status: DC | PRN
  Administered 2015-07-03: 1 mg via INTRAVENOUS
  Administered 2015-07-03 – 2015-07-05 (×4): 2 mg via INTRAVENOUS
  Filled 2015-07-02 (×5): qty 1

## 2015-07-02 MED ORDER — GLYCOPYRROLATE 0.2 MG/ML IJ SOLN
INTRAMUSCULAR | Status: DC | PRN
  Administered 2015-07-02: 0.6 mg via INTRAVENOUS

## 2015-07-02 MED ORDER — LACTATED RINGERS IV SOLN
INTRAVENOUS | Status: DC
  Administered 2015-07-02: 125 mL/h via INTRAVENOUS
  Administered 2015-07-02: 1000 mL via INTRAVENOUS

## 2015-07-02 SURGICAL SUPPLY — 41 items
BAG BILE T-TUBES STRL (MISCELLANEOUS) IMPLANT
CANISTER SUCT 1200ML W/VALVE (MISCELLANEOUS) ×4 IMPLANT
CATH TRAY 16F METER LATEX (MISCELLANEOUS) ×4 IMPLANT
CHLORAPREP W/TINT 26ML (MISCELLANEOUS) ×4 IMPLANT
COVER MAYO STAND STRL (DRAPES) ×4 IMPLANT
DRAPE LAPAROTOMY 100X77 ABD (DRAPES) IMPLANT
DRAPE LAPAROTOMY TRNSV 106X77 (MISCELLANEOUS) IMPLANT
DRSG TELFA 3X8 NADH (GAUZE/BANDAGES/DRESSINGS) ×4 IMPLANT
ELECT BLADE 6 FLAT ULTRCLN (ELECTRODE) ×4 IMPLANT
ELECT BLADE 6.5 EXT (BLADE) IMPLANT
ELECT CAUTERY BLADE 6.4 (BLADE) ×4 IMPLANT
ELECT REM PT RETURN 9FT ADLT (ELECTROSURGICAL) ×4
ELECTRODE REM PT RTRN 9FT ADLT (ELECTROSURGICAL) ×2 IMPLANT
GAUZE SPONGE 4X4 12PLY STRL (GAUZE/BANDAGES/DRESSINGS) ×8 IMPLANT
GLOVE BIO SURGEON STRL SZ 6 (GLOVE) ×8 IMPLANT
GLOVE BIO SURGEON STRL SZ7 (GLOVE) ×4 IMPLANT
GLOVE INDICATOR 8.0 STRL GRN (GLOVE) ×24 IMPLANT
GOWN STRL REUS W/ TWL LRG LVL3 (GOWN DISPOSABLE) ×6 IMPLANT
GOWN STRL REUS W/ TWL XL LVL3 (GOWN DISPOSABLE) ×2 IMPLANT
GOWN STRL REUS W/TWL LRG LVL3 (GOWN DISPOSABLE) ×6
GOWN STRL REUS W/TWL XL LVL3 (GOWN DISPOSABLE) ×2
KIT RM TURNOVER STRD PROC AR (KITS) ×4 IMPLANT
LABEL OR SOLS (LABEL) ×4 IMPLANT
LIGASURE IMPACT 36 18CM CVD LR (INSTRUMENTS) ×4 IMPLANT
NS IRRIG 1000ML POUR BTL (IV SOLUTION) ×4 IMPLANT
NS IRRIG 500ML POUR BTL (IV SOLUTION) ×8 IMPLANT
PACK BASIN MAJOR ARMC (MISCELLANEOUS) ×4 IMPLANT
RETRACTOR WOUND ALXS 18CM MED (MISCELLANEOUS) ×2 IMPLANT
RTRCTR WOUND ALEXIS O 18CM MED (MISCELLANEOUS) ×4
SET CYSTO W/LG BORE CLAMP LF (SET/KITS/TRAYS/PACK) ×4 IMPLANT
STAPLER SKIN PROX 35W (STAPLE) ×4 IMPLANT
SUT CHROMIC 0 CT 1 (SUTURE) ×4 IMPLANT
SUT MAXON ABS #0 GS21 30IN (SUTURE) ×8 IMPLANT
SUT SILK 0 (SUTURE) ×4
SUT SILK 0 30XBRD TIE 6 (SUTURE) ×4 IMPLANT
SUT VIC AB 0 CT1 27 (SUTURE) ×4
SUT VIC AB 0 CT1 27XCR 8 STRN (SUTURE) ×4 IMPLANT
SUT VIC AB 2-0 CT1 36 (SUTURE) ×4 IMPLANT
TRAY PREP VAG/GEN (MISCELLANEOUS) ×4 IMPLANT
TUBING ART PRESS 48 MALE/FEM (TUBING) IMPLANT
WATER STERILE IRR 1000ML POUR (IV SOLUTION) IMPLANT

## 2015-07-02 NOTE — Op Note (Signed)
OPERATIVE NOTE:  Brittany Baldwin PROCEDURE DATE: 07/02/2015   PREOPERATIVE DIAGNOSIS:  1. Abnormal uterine bleeding 2. Uterine fibroids 3. Chronic pelvic pain POSTOPERATIVE DIAGNOSIS: Same as above and pelvic adhesive disease PROCEDURE:  1. Total abdominal hysterectomy bilateral salpingectomy 2. Cystoscopy  SURGEON:  Brayton Mars, MD ASSISTANTS: Dr. Marcelline Mates and PA-S Cathlean Sauer ANESTHESIA: General INDICATIONS: 45 y.o. [id number] female with long history of chronic pelvic pain, abnormal uterine bleeding and uterine fibroids, status post multiple surgeries in the past, presents for definitive treatment.  FINDINGS:   1. Multi-fibroid uterus 2. Fallopian tubes with paratubal cysts 3. Normal-appearing ovaries 4. Pelvic adhesions involving the uterus cul-de-sac and bowel, lysed 5. Normal ureteral eflux on cystoscopy   I/O's: Total I/O In: 1100 [I.V.:1100] Out: 600 [Urine:200; Blood:400] COUNTS:  YES SPECIMENS: Uterus, cervix, bilateral fallopian tubes ANTIBIOTIC PROPHYLAXIS:Ancef 2 grams COMPLICATIONS: None immediate  PROCEDURE IN DETAIL: Patient was brought to the operating room where she was placed in supine position. General endotracheal anesthesia was induced without difficulty. A ChloraPrep and Betadine abdominal perineal intravaginal prep and drape was performed in standard fashion. A Foley catheter was placed and was draining clear yellow urine. Timeout was performed. Transverse incision was made in the abdomen to excise the previous abdominal scar. Incision was taken down to the fascia was incised transversely laterally with Mayo scissors. The rectus muscle was dissected off the fascia through sharp and blunt dissection. The peritoneum was entered. The Alexis retractor was placed to help optimize visualization. Bowel was packed off with wet laps. Uterus was grasped with a tenaculum. Hysterectomy was then performed in standard fashion. The LigaSure instrument along with traditional  clamps and suture ligatures were used to facilitate performance of the hysterectomy. The utero-ovarian ligament was cross clamped coagulated and cut. The cardinal broad ligament complexes were then taken down sequentially using the LigaSure instrument through a clamping coagulating and cutting technique. At the level of the lower uterine segment the bladder was dissected off of the lower uterine segment through sharp dissection. Once adequately mobilized the remainder of the cardinal broad ligament complexes were taken down as in the LigaSure instrument to the level of the cervicovaginal junction. At this point the cervicovaginal junction was crossclamped with Heaney clamps and the cervix and uterus were excised from the operative field. The angles of the vagina were closed using 0 Vicryl sutures in a figure-of-eight manner. The intervening vaginal cuff was closed with the irrigant sutures of 0 Vicryl. The pelvis was copiously irrigated. Hemostasis was noted. The bilateral fallopian tubes were then removed with the LigaSure instrument. The mesosalpinx was cross clamped coagulated and cut and the specimens were handed off the field for analysis and pathology. Once verifying hemostasis was adequate, the the history patient was removed from the abdominal pelvic cavity. The abdomen was closed in layers with 0 Maxon being used on the fascia in a simple running manner. Staples were used to close the skin. Lidoderm patch was placed. Telfa pressure dressing was applied. Patient was then placed in the frog-leg position in order to facilitate cystoscopy. The patient received 0.5 cc of fluorescein intravenously. This was followed by using the 30 cystoscope with lactated Ringer's as irrigant to fill the bladder and observed for ureteral eflux. There was prompt Eflux of the fluorescein dye. Cystoscopy was then terminated after inspection of the bladder which did not reveal any rents or suture. Patient was then awakened  extubated taken to recovery room in satisfactory condition. All instruments needles and sponge counts were verified  as correct.  Brittany Baldwin A. Brittany Plants, MD, [facility]

## 2015-07-02 NOTE — OR Nursing (Signed)
Patient c/o nausea med given  

## 2015-07-02 NOTE — Transfer of Care (Signed)
Immediate Anesthesia Transfer of Care Note  Patient: Brittany Baldwin  Procedure(s) Performed: Procedure(s): HYSTERECTOMY ABDOMINAL WITH BS (Bilateral) CYSTOSCOPY (Bilateral)  Patient Location: PACU  Anesthesia Type:General  Level of Consciousness: awake  Airway & Oxygen Therapy: Patient Spontanous Breathing  Post-op Assessment: Report given to RN  Post vital signs: Reviewed  Last Vitals:  Filed Vitals:   07/02/15 0649  BP: 108/79  Pulse: 78  Temp: 37.3 C  Resp: 18    Complications: No apparent anesthesia complications

## 2015-07-02 NOTE — Anesthesia Preprocedure Evaluation (Signed)
Anesthesia Evaluation  Patient identified by MRN, date of birth, ID band Patient awake    Reviewed: Allergy & Precautions, NPO status , Patient's Chart, lab work & pertinent test results, reviewed documented beta blocker date and time   Airway Mallampati: II  TM Distance: >3 FB Neck ROM: Full    Dental  (+) Caps Bridge:   Pulmonary shortness of breath and with exertion,    Pulmonary exam normal breath sounds clear to auscultation       Cardiovascular Normal cardiovascular exam+ dysrhythmias Supra Ventricular Tachycardia      Neuro/Psych  Headaches, Anxiety    GI/Hepatic Neg liver ROS, GERD  Medicated and Controlled,  Endo/Other  negative endocrine ROS  Renal/GU negative Renal ROS  negative genitourinary   Musculoskeletal negative musculoskeletal ROS (+)   Abdominal Normal abdominal exam  (+)   Peds negative pediatric ROS (+)  Hematology  (+) anemia ,   Anesthesia Other Findings   Reproductive/Obstetrics                             Anesthesia Physical Anesthesia Plan  ASA: II  Anesthesia Plan: General   Post-op Pain Management:    Induction: Intravenous  Airway Management Planned: Oral ETT  Additional Equipment:   Intra-op Plan:   Post-operative Plan: Extubation in OR  Informed Consent: I have reviewed the patients History and Physical, chart, labs and discussed the procedure including the risks, benefits and alternatives for the proposed anesthesia with the patient or authorized representative who has indicated his/her understanding and acceptance.   Dental advisory given  Plan Discussed with: CRNA and Surgeon  Anesthesia Plan Comments:         Anesthesia Quick Evaluation

## 2015-07-02 NOTE — H&P (View-Only) (Signed)
Subjective:    Patient is a 45 y.o. [id number] scheduled for Total abdominal hysterectomy and bilateral salpingectomy. Indications for procedure are Abnormal uterine bleeding; chronic pelvic pain.   Pertinent Gynecological History: History of multiple gynecologic surgeries including: 1.  McDonald cerclage 4. 2.  Shirodkar cerclage. 3.  Hysterotomy for UFD. 4.  Cesarean section delivery 5.  BTL. 6.  NovaSure endometrial ablation  Menses: Irregular  Discussed Blood/Blood Products: yes   Menstrual History: OB History    Gravida Para Term Preterm AB TAB SAB Ectopic Multiple Living   8 3 1 2 5 1 4   1       Menarche age: NA  Patient's last menstrual period was 06/26/2015 (exact date).    Past Medical History  Diagnosis Date  . Tachycardia   . Hyperlipemia   . GERD (gastroesophageal reflux disease)   . Fibroid   . Headache     H/O  . Anemia     H/O   . Dysrhythmia     SVT-HAD CARDIAC ABLATION DONE IN 2016 BUT PT STATES SHE STILL GETS TACHYCARDIC AND IS SYPMPTOMATIC WITH SOB, DIZZINESS DURING TACHY EPISODES (06-25-15)  . Shortness of breath dyspnea     ASSOCIATED WITH TACHYCARDIA    Past Surgical History  Procedure Laterality Date  . Tubal ligation    . Cesarean section    . Appendectomy    . Cholecystectomy    . Cervical cerclage      McDonald cerclage 4; Shirodkar cerclage 1  . Nissen fundoplication    . Fibroids removed    . Novasure ablation    . Dilation and curettage of uterus    . Cardiac electrophysiology study and ablation      OB History  Gravida Para Term Preterm AB SAB TAB Ectopic Multiple Living  8 3 1 2 5 4 1   1     # Outcome Date GA Lbr Len/2nd Weight Sex Delivery Anes PTL Lv  8 SAB 2003          7 Preterm 2001        FD  6 Term 1998   4 lb 2.1 oz (1.873 kg) F Vag-Spont   Y  5 Preterm 1996        FD  4 SAB 1994          3 SAB 1992          2 TAB 1991          1 SAB 1990              Social History   Social History  . Marital  Status: Married    Spouse Name: N/A  . Number of Children: N/A  . Years of Education: N/A   Social History Main Topics  . Smoking status: Never Smoker   . Smokeless tobacco: None  . Alcohol Use: No  . Drug Use: No  . Sexual Activity: Yes    Birth Control/ Protection: Surgical   Other Topics Concern  . None   Social History Narrative    Family History  Problem Relation Age of Onset  . Diabetes Mother   . Prostate cancer Father   . Congestive Heart Failure Father   . Cancer Neg Hx      (Not in a hospital admission)  Allergies  Allergen Reactions  . Codeine Shortness Of Breath  . Hydrocodone-Acetaminophen Rash  . Adhesive [Tape]     ELECTRODES FROM HOLTER MONITOR CAUSED RASH  . Aspirin  Other (See Comments)    Stomach issues  . Diphenhydramine Hcl Other (See Comments)    Through an I.V. drip    Review of Systems Constitutional: No recent fever/chills/sweats Respiratory: No recent cough/bronchitis Cardiovascular: No chest pain Gastrointestinal: No recent nausea/vomiting/diarrhea Genitourinary: No UTI symptoms Hematologic/lymphatic: history of coagulopathy or recent blood thinner use    Objective:    BP 122/74 mmHg  Pulse 78  Ht 5\' 6"  (1.676 m)  Wt 200 lb 14.4 oz (91.128 kg)  BMI 32.44 kg/m2  LMP 06/26/2015 (Exact Date)  General:   Normal  Skin:   normal  HEENT:  Normal  Neck:  Supple without Adenopathy or Thyromegaly  Lungs:   Heart:              Breasts:   Abdomen:  Pelvis:  M/S   Extremeties:  Neuro:    clear to auscultation bilaterally   Normal without murmur   Not Examined   soft, non-tender; bowel sounds normal; no masses,  no organomegaly   Exam deferred to OR  No CVAT  Warm/Dry   Normal          Assessment:    1.  Chronic pelvic pain. 2.  Abnormal uterine bleeding   Plan:  Total abdominal hysterectomy with bilateral salpingectomy Heparin prophylaxis due to history of PE  Preop counseling: The patient is to undergo total  abdominal hysterectomy with bilateral salpingectomy.  She is understanding of the planned procedure and is aware of and is accepting of all surgical risks which include but are not limited to bleeding, infection, pelvic organ injury with need for repair, blood clot disorders, anesthesia risks, etc.  All questions have been answered.  Informed consent is given.  Patient is ready and willing to peed with surgery as scheduled.  Brayton Mars, MD  Note: This dictation was prepared with Dragon dictation along with smaller phrase technology. Any transcriptional errors that result from this process are unintentional.

## 2015-07-02 NOTE — Anesthesia Procedure Notes (Signed)
Procedure Name: Intubation Date/Time: 07/02/2015 7:47 AM Performed by: Leander Rams Pre-anesthesia Checklist: Patient identified, Emergency Drugs available, Suction available, Patient being monitored and Timeout performed Patient Re-evaluated:Patient Re-evaluated prior to inductionOxygen Delivery Method: Circle system utilized Preoxygenation: Pre-oxygenation with 100% oxygen Intubation Type: IV induction Ventilation: Mask ventilation without difficulty Laryngoscope Size: 3 Grade View: Grade I Tube type: Oral Tube size: 7.0 mm Number of attempts: 1 Airway Equipment and Method: Stylet Placement Confirmation: ETT inserted through vocal cords under direct vision,  positive ETCO2 and breath sounds checked- equal and bilateral Secured at: 22 cm Tube secured with: Tape Dental Injury: Teeth and Oropharynx as per pre-operative assessment

## 2015-07-02 NOTE — Interval H&P Note (Signed)
History and Physical Interval Note:  07/02/2015 7:29 AM  Brittany Baldwin  has presented today for surgery, with the diagnosis of aub,polymenorrhea,chronic pelvic pain  The various methods of treatment have been discussed with the patient and family. After consideration of risks, benefits and other options for treatment, the patient has consented to  TAH Bilateral Salpingectomy as a surgical intervention .  The patient's history has been reviewed, patient examined, no change in status, stable for surgery.  I have reviewed the patient's chart and labs.  Questions were answered to the patient's satisfaction.     Hassell Done A Ioana Louks

## 2015-07-03 LAB — SURGICAL PATHOLOGY

## 2015-07-03 LAB — HEMOGLOBIN: HEMOGLOBIN: 11.6 g/dL — AB (ref 12.0–16.0)

## 2015-07-03 LAB — PLATELET COUNT: PLATELETS: 176 10*3/uL (ref 150–440)

## 2015-07-03 MED ORDER — ACETAMINOPHEN 500 MG PO TABS
1000.0000 mg | ORAL_TABLET | Freq: Four times a day (QID) | ORAL | Status: DC | PRN
  Administered 2015-07-05: 1000 mg via ORAL
  Filled 2015-07-03: qty 2

## 2015-07-03 MED ORDER — HYDROMORPHONE HCL 2 MG PO TABS
2.0000 mg | ORAL_TABLET | ORAL | Status: DC | PRN
  Administered 2015-07-03 – 2015-07-05 (×9): 2 mg via ORAL
  Filled 2015-07-03 (×10): qty 1

## 2015-07-03 MED ORDER — PROMETHAZINE HCL 25 MG PO TABS
25.0000 mg | ORAL_TABLET | Freq: Four times a day (QID) | ORAL | Status: DC | PRN
  Administered 2015-07-03 – 2015-07-05 (×3): 25 mg via ORAL
  Filled 2015-07-03 (×3): qty 1

## 2015-07-03 MED ORDER — PROPRANOLOL HCL ER 80 MG PO CP24
80.0000 mg | ORAL_CAPSULE | Freq: Two times a day (BID) | ORAL | Status: DC
  Administered 2015-07-03 – 2015-07-05 (×5): 80 mg via ORAL
  Filled 2015-07-03 (×5): qty 1

## 2015-07-03 MED ORDER — LIDOCAINE 5 % EX PTCH
1.0000 | MEDICATED_PATCH | CUTANEOUS | Status: DC
  Administered 2015-07-03 – 2015-07-05 (×3): 1 via TRANSDERMAL
  Filled 2015-07-03 (×2): qty 1

## 2015-07-03 NOTE — Progress Notes (Signed)
Dr. Enzo Bi paged at 1615 about giving patient beta blocker with current vitals. Patient is taking propranolol twice a day. Patient's blood pressure was 96/58 with a pulse of 84 at 1605. Dr. Enzo Bi instructed me to give propranolol with current vitals.

## 2015-07-03 NOTE — Progress Notes (Signed)
1 Day Post-Op Procedure(s) (LRB): HYSTERECTOMY ABDOMINAL WITH BS (Bilateral) CYSTOSCOPY (Bilateral)  Subjective: Patient reports tolerating PO and no problems voiding.    Objective: I have reviewed patient's vital signs, intake and output, medications and labs.  General: alert and cooperative Resp: clear to auscultation bilaterally Cardio: regular rate and rhythm, S1, S2 normal, no murmur, click, rub or gallop GI: soft, non-tender; bowel sounds normal; no masses,  no organomegaly Extremities: extremities normal, atraumatic, no cyanosis or edema Vaginal Bleeding: none Incision C/D/I  Assessment: s/p Procedure(s): HYSTERECTOMY ABDOMINAL WITH BS (Bilateral) CYSTOSCOPY (Bilateral): stable and progressing well  Plan: Advance diet Encourage ambulation Advance to PO medication  LOS: 1 day    Brittany Baldwin 07/03/2015, 2:29 PM

## 2015-07-03 NOTE — Anesthesia Postprocedure Evaluation (Signed)
Anesthesia Post Note  Patient: Brittany Baldwin  Procedure(s) Performed: Procedure(s) (LRB): HYSTERECTOMY ABDOMINAL WITH BS (Bilateral) CYSTOSCOPY (Bilateral)  Patient location during evaluation: PACU Anesthesia Type: General Level of consciousness: awake and alert and oriented Pain management: pain level controlled Vital Signs Assessment: post-procedure vital signs reviewed and stable Respiratory status: spontaneous breathing Cardiovascular status: blood pressure returned to baseline Anesthetic complications: no    Last Vitals:  Filed Vitals:   07/03/15 1100 07/03/15 1159  BP:  112/60  Pulse:  79  Temp:  36.7 C  Resp: 18 18    Last Pain:  Filed Vitals:   07/03/15 1226  PainSc: 4                  Kolbe Delmonaco

## 2015-07-04 MED ORDER — ATORVASTATIN CALCIUM 20 MG PO TABS
40.0000 mg | ORAL_TABLET | Freq: Every day | ORAL | Status: DC
  Administered 2015-07-04: 40 mg via ORAL
  Filled 2015-07-04: qty 2

## 2015-07-04 NOTE — Progress Notes (Signed)
2 Days Post-Op Procedure(s) (LRB): HYSTERECTOMY ABDOMINAL WITH BS (Bilateral) CYSTOSCOPY (Bilateral)  Subjective: Patient reports tolerating PO, + flatus and no problems voiding.    Objective: I have reviewed patient's vital signs, intake and output, medications, labs and pathology.  GI: soft, non-tender; bowel sounds normal; no masses,  no organomegaly and incision: clean, dry and intact Extremities: Homans sign is negative, no sign of DVT Vaginal Bleeding: none .  Assessment: s/p Procedure(s): HYSTERECTOMY ABDOMINAL WITH BS (Bilateral) CYSTOSCOPY (Bilateral): stable and progressing well  Plan: Discontinue IV fluids  LOS: 2 days   D/C tomorrow   Hassell Done A Nyomie Ehrlich 07/04/2015, 12:30 PM

## 2015-07-05 MED ORDER — HYDROMORPHONE HCL 2 MG PO TABS: 2.0000 mg | ORAL_TABLET | ORAL | Status: DC | PRN

## 2015-07-05 MED ORDER — PROMETHAZINE HCL 25 MG PO TABS: 25.0000 mg | ORAL_TABLET | Freq: Four times a day (QID) | ORAL | Status: DC | PRN

## 2015-07-05 MED ORDER — DOCUSATE SODIUM 100 MG PO CAPS: 100.0000 mg | ORAL_CAPSULE | Freq: Two times a day (BID) | ORAL | Status: DC

## 2015-07-05 NOTE — Progress Notes (Signed)
Pt discharged home.  Discharge instructions, prescriptions and follow up appointment given to and reviewed with pt.  Pt verbalized understanding.  

## 2015-07-05 NOTE — Progress Notes (Signed)
  July 05, 2015  Patient: Brittany Baldwin  Date of Birth: 10/06/1970  Date of Visit: 06/19/2015    To Whom It May Concern:  Please excuse [name] from school 1/30-2/1.  Her Mother was in the Hospital [facility] at [facility] .  Sincerely,   Orie Rout, RN

## 2015-07-05 NOTE — Discharge Summary (Signed)
Physician Discharge Summary  Patient ID: Margie Philbrick MRN: [id number] DOB/AGE: 1970-10-25 45 y.o.  Admit date: 07/02/2015 Discharge date: 07/05/2015  Admission Diagnoses: AUB, Chronic Pelvic Pain and Fibroids  Discharge Diagnoses:  SAA and Endometriosis  Operative Procedures: Procedure(s): HYSTERECTOMY ABDOMINAL WITH BS (Bilateral) CYSTOSCOPY (Bilateral)  Hospital Course: Uncomplicated.   Significant Diagnostic Studies:  Lab Results  Component Value Date   HGB 11.6* 07/03/2015   HGB 13.0 06/26/2015   Lab Results  Component Value Date   HCT 39.5 06/26/2015   CBC Latest Ref Rng 07/03/2015 06/26/2015  WBC 3.6 - 11.0 K/uL - 6.4  Hemoglobin 12.0 - 16.0 g/dL 11.6(L) 13.0  Hematocrit 35.0 - 47.0 % - 39.5  Platelets 150 - 440 K/uL 176 191     Discharged Condition: good  Discharge Exam: Blood pressure 84/47, pulse 88, temperature 98.5 F (36.9 C), temperature source Oral, resp. rate 18, height 5\' 6"  (1.676 m), weight 200 lb (90.719 kg), last menstrual period 06/26/2015, SpO2 100 %. Incision/Wound: clean, dry and minimal bloody drainage (resolved)  Disposition: Final discharge disposition not confirmed  Discharge Instructions    Discharge patient    Complete by:  As directed             Medication List    TAKE these medications        atorvastatin 20 MG tablet  Commonly known as:  LIPITOR  TK 1 T PO Q NIGHT     Biotin 1 MG Caps  Take by mouth.     docusate sodium 100 MG capsule  Commonly known as:  COLACE  Take 1 capsule (100 mg total) by mouth 2 (two) times daily.     HYDROmorphone 2 MG tablet  Commonly known as:  DILAUDID  Take 1 tablet (2 mg total) by mouth every 3 (three) hours as needed for moderate pain or severe pain.     MULTIVITAMIN ADULT PO  Take by mouth.     promethazine 25 MG tablet  Commonly known as:  PHENERGAN  Take 1 tablet (25 mg total) by mouth every 6 (six) hours as needed for nausea.     propranolol 20 MG tablet  Commonly known as:   INDERAL  TK 1 T PO PRN     propranolol ER 80 MG 24 hr capsule  Commonly known as:  INDERAL LA  TK ONE C PO BID           Follow-up Information    Follow up with Brayton Mars, MD. Call on 07/10/2015.   Specialties:  Obstetrics and Gynecology, Radiology   Why:  For staple removal   Contact information:   [address] [place] [address] [phone]       Follow up with Brayton Mars, MD. Schedule an appointment as soon as possible for a visit in 5 days.   Specialties:  Obstetrics and Gynecology, Radiology   Why:  Post Op Check- staple removal   Contact information:   [address] [place] [address] [phone]       Signed: Alanda Slim Defrancesco 07/05/2015, 5:09 PM

## 2015-07-06 ENCOUNTER — Telehealth: Payer: Self-pay | Admitting: Obstetrics and Gynecology

## 2015-07-06 NOTE — Telephone Encounter (Signed)
PT WANTED Brittany Baldwin TO BE AWARE OF AN INCIDENT THAT HAPPENED TODAY, PT HAD SURGERY ON Monday AND TODAY SHE WAS FEELING NAUSEOUS AND WENT TO TAKE PHENERGAN THAT DR DE HAD PRESCRIBED HER AND WHEN SHE WENT TO SWALLOW IT SHE CHOKED ON THE WATER AND STARTED COUGHING AND HER SIGHT STARTED BLEEDING, HER AND HER DAUGHTER GOT THE BLEEDING UNDER CONTROL BUT DURING THE TIME SHE GOT REALLY LIGHT HEADED AND FELT FAINT, HER DRAUGHTER CALLED EMS, THEY DID NOT GO TO THE ER BUT THE EMS TOLD HER TO LET Brittany Baldwin KNOW WHAT HAPPENED.

## 2015-07-06 NOTE — Telephone Encounter (Signed)
Pt states she started coughing when she got strangled on h20. At that time her incision started bleeding but has stopped now. She got really hot, sweaty, dizzy, and nauseous. However, once she had a BM she felt better. Does have issues with constipation. She is not having any pain at this time. Pos eating/drinking. Urinating ok. NO fevers. Advised to continue with colace bid. Push fluids. Eat fruits and veggies. Advised if sx persist she will need to be seen prior to 1 week post-op.

## 2015-07-09 ENCOUNTER — Emergency Department: Payer: BLUE CROSS/BLUE SHIELD

## 2015-07-09 ENCOUNTER — Emergency Department
Admission: EM | Admit: 2015-07-09 | Discharge: 2015-07-10 | Disposition: A | Discharge status: Home / Self Care | Payer: BLUE CROSS/BLUE SHIELD | Source: Home / Self Care | Attending: Emergency Medicine | Admitting: Emergency Medicine

## 2015-07-09 ENCOUNTER — Encounter: Payer: Self-pay | Admitting: Emergency Medicine

## 2015-07-09 DIAGNOSIS — Z833 Family history of diabetes mellitus: Secondary | ICD-10-CM | POA: Insufficient documentation

## 2015-07-09 DIAGNOSIS — L7632 Postprocedural hematoma of skin and subcutaneous tissue following other procedure: Secondary | ICD-10-CM | POA: Diagnosis present

## 2015-07-09 DIAGNOSIS — E785 Hyperlipidemia, unspecified: Secondary | ICD-10-CM | POA: Diagnosis not present

## 2015-07-09 DIAGNOSIS — K219 Gastro-esophageal reflux disease without esophagitis: Secondary | ICD-10-CM | POA: Insufficient documentation

## 2015-07-09 DIAGNOSIS — Z86711 Personal history of pulmonary embolism: Secondary | ICD-10-CM | POA: Insufficient documentation

## 2015-07-09 DIAGNOSIS — Z8042 Family history of malignant neoplasm of prostate: Secondary | ICD-10-CM | POA: Insufficient documentation

## 2015-07-09 DIAGNOSIS — Z8249 Family history of ischemic heart disease and other diseases of the circulatory system: Secondary | ICD-10-CM | POA: Diagnosis not present

## 2015-07-09 DIAGNOSIS — T148XXA Other injury of unspecified body region, initial encounter: Secondary | ICD-10-CM

## 2015-07-09 DIAGNOSIS — Z90710 Acquired absence of both cervix and uterus: Secondary | ICD-10-CM | POA: Insufficient documentation

## 2015-07-09 LAB — BASIC METABOLIC PANEL
ANION GAP: 8 (ref 5–15)
BUN: 12 mg/dL (ref 6–20)
CHLORIDE: 106 mmol/L (ref 101–111)
CO2: 23 mmol/L (ref 22–32)
Calcium: 9.3 mg/dL (ref 8.9–10.3)
Creatinine, Ser: 1.09 mg/dL — ABNORMAL HIGH (ref 0.44–1.00)
GFR calc Af Amer: >60 mL/min (ref >60)
GLUCOSE: 106 mg/dL — AB (ref 65–99)
POTASSIUM: 3.7 mmol/L (ref 3.5–5.1)
Sodium: 137 mmol/L (ref 135–145)

## 2015-07-09 LAB — CBC
HCT: 34.6 % — ABNORMAL LOW (ref 35.0–47.0)
Hemoglobin: 11.8 g/dL — ABNORMAL LOW (ref 12.0–16.0)
MCH: 29.5 pg (ref 26.0–34.0)
MCHC: 34.1 g/dL (ref 32.0–36.0)
MCV: 86.6 fL (ref 80.0–100.0)
PLATELETS: 266 10*3/uL (ref 150–440)
RBC: 4 MIL/uL (ref 3.80–5.20)
RDW: 13.4 % (ref 11.5–14.5)
WBC: 7.9 10*3/uL (ref 3.6–11.0)

## 2015-07-09 LAB — URINALYSIS COMPLETE WITH MICROSCOPIC (ARMC ONLY)
BILIRUBIN URINE: NEGATIVE
Bacteria, UA: NONE SEEN
GLUCOSE, UA: NEGATIVE mg/dL
NITRITE: NEGATIVE
PROTEIN: NEGATIVE mg/dL
Specific Gravity, Urine: 1.014 (ref 1.005–1.030)
pH: 6 (ref 5.0–8.0)

## 2015-07-09 MED ORDER — MORPHINE SULFATE (PF) 2 MG/ML IV SOLN
2.0000 mg | Freq: Once | INTRAVENOUS | Status: AC
  Administered 2015-07-10: 2 mg via INTRAVENOUS
  Filled 2015-07-09: qty 1

## 2015-07-09 MED ORDER — IOHEXOL 240 MG/ML SOLN
25.0000 mL | INTRAMUSCULAR | Status: AC
  Administered 2015-07-10 (×2): 25 mL via ORAL

## 2015-07-09 MED ORDER — ONDANSETRON HCL 4 MG/2ML IJ SOLN
4.0000 mg | Freq: Once | INTRAMUSCULAR | Status: AC
  Administered 2015-07-10: 4 mg via INTRAVENOUS
  Filled 2015-07-09: qty 2

## 2015-07-09 NOTE — ED Provider Notes (Signed)
[facility] Emergency Department Provider Note  ____________________________________________  Time seen: 11:20 PM  I have reviewed the triage vital signs and the nursing notes.   HISTORY  Chief Complaint Post-op Problem      HPI Brittany Baldwin is a 45 y.o. female presents 1 week status post partial hysterectomy performed by Dr. Enzo Bi with fever pelvic pain dysuria and cough. Patient states current pain score 7 out of 10.     Past Medical History  Diagnosis Date  . Tachycardia   . Hyperlipemia   . GERD (gastroesophageal reflux disease)   . Fibroid   . Headache     H/O  . Anemia     H/O   . Shortness of breath dyspnea     ASSOCIATED WITH TACHYCARDIA  . Anxiety   . Dysrhythmia     SVT-HAD CARDIAC ABLATION DONE IN 2016 BUT PT STATES SHE STILL GETS INTERMITTENT TACHYCARDIC AND IS SYPMPTOMATIC WITH SOB, DIZZINESS DURING TACHY EPISODES (06-25-15)    Patient Active Problem List   Diagnosis Date Noted  . Chronic pelvic pain in female 07/02/2015  . Status post abdominal hysterectomy 07/02/2015  . History of pulmonary embolus during pregnancy 05/16/2015  . Tachycardia 05/10/2015  . History of cesarean section 05/10/2015  . Shirodkar cerclage present 05/10/2015  . History of fundoplication [id number]  . Gastro-esophageal reflux disease without esophagitis 03/28/2014  . Combined fat and carbohydrate induced hyperlipemia 03/28/2014    Past Surgical History  Procedure Laterality Date  . Tubal ligation    . Cesarean section    . Appendectomy    . Cholecystectomy    . Cervical cerclage      McDonald cerclage 4; Shirodkar cerclage 1  . Nissen fundoplication    . Fibroids removed    . Novasure ablation    . Dilation and curettage of uterus    . Cardiac electrophysiology study and ablation    . Abdominal hysterectomy Bilateral 07/02/2015    Procedure: HYSTERECTOMY ABDOMINAL WITH BS;  Surgeon: Brayton Mars, MD;  Location: [facility] ORS;   Service: Gynecology;  Laterality: Bilateral;  . Cystoscopy Bilateral 07/02/2015    Procedure: CYSTOSCOPY;  Surgeon: Brayton Mars, MD;  Location: [facility] ORS;  Service: Gynecology;  Laterality: Bilateral;    Current Outpatient Rx  Name  Route  Sig  Dispense  Refill  . atorvastatin (LIPITOR) 20 MG tablet      TK 1 T PO Q NIGHT         . Biotin 1 MG CAPS   Oral   Take by mouth.         . docusate sodium (COLACE) 100 MG capsule   Oral   Take 1 capsule (100 mg total) by mouth 2 (two) times daily.   30 capsule   0   . HYDROmorphone (DILAUDID) 2 MG tablet   Oral   Take 1 tablet (2 mg total) by mouth every 3 (three) hours as needed for moderate pain or severe pain.   30 tablet   0   . Multiple Vitamins-Minerals (MULTIVITAMIN ADULT PO)   Oral   Take by mouth.         . promethazine (PHENERGAN) 25 MG tablet   Oral   Take 1 tablet (25 mg total) by mouth every 6 (six) hours as needed for nausea.   15 tablet   0   . propranolol (INDERAL) 20 MG tablet      TK 1 T PO PRN         .  propranolol ER (INDERAL LA) 80 MG 24 hr capsule      TK ONE C PO BID           Allergies Codeine; Hydrocodone-acetaminophen; Adhesive; Aspirin; and Diphenhydramine hcl  Family History  Problem Relation Age of Onset  . Diabetes Mother   . Prostate cancer Father   . Congestive Heart Failure Father   . Cancer Neg Hx     Social History Social History  Substance Use Topics  . Smoking status: Never Smoker   . Smokeless tobacco: None  . Alcohol Use: No    Review of Systems  Constitutional: Negative for fever. Eyes: Negative for visual changes. ENT: Negative for sore throat. Cardiovascular: Negative for chest pain. Respiratory: Negative for shortness of breath. Positive for cough Gastrointestinal: Positive for pelvic pain Genitourinary: Positive for dysuria. Musculoskeletal: Negative for back pain. Skin: Negative for rash. Neurological: Negative for headaches, focal  weakness or numbness.   10-point ROS otherwise negative.  ____________________________________________   PHYSICAL EXAM:  VITAL SIGNS: ED Triage Vitals  Enc Vitals Group     BP 07/09/15 1939 99/67 mmHg     Pulse Rate 07/09/15 1939 99     Resp 07/09/15 1939 18     Temp 07/09/15 1939 98.4 F (36.9 C)     Temp Source 07/09/15 1939 Oral     SpO2 07/09/15 1939 100 %     Weight 07/09/15 1939 198 lb (89.812 kg)     Height 07/09/15 1939 5\' 6"  (1.676 m)     Head Cir --      Peak Flow --      Pain Score 07/09/15 1940 7     Pain Loc --      Pain Edu? --      Excl. in [place]? --     Constitutional: Alert and oriented. Well appearing and in no distress. Eyes: Conjunctivae are normal. PERRL. Normal extraocular movements. ENT   Head: Normocephalic and atraumatic.   Nose: No congestion/rhinnorhea.   Mouth/Throat: Mucous membranes are moist.   Neck: No stridor. Hematological/Lymphatic/Immunilogical: No cervical lymphadenopathy. Cardiovascular: Normal rate, regular rhythm. Normal and symmetric distal pulses are present in all extremities. No murmurs, rubs, or gallops. Respiratory: Normal respiratory effort without tachypnea nor retractions. Breath sounds are clear and equal bilaterally. No wheezes/rales/rhonchi. Gastrointestinal: Soft and nontender. No distention. There is no CVA tenderness. Genitourinary: deferred Musculoskeletal: Nontender with normal range of motion in all extremities. No joint effusions.  No lower extremity tenderness nor edema. Neurologic:  Normal speech and language. No gross focal neurologic deficits are appreciated. Speech is normal.  Skin:  Skin is warm, dry and intact. Pelvic abdominal wound clean dry and intact without evidence of infection. Area of induration noted along the wound extending from the right to be on midline Psychiatric: Mood and affect are normal. Speech and behavior are normal. Patient exhibits appropriate insight and  judgment.  ____________________________________________    LABS (pertinent positives/negatives)  Labs Reviewed  CBC - Abnormal; Notable for the following:    Hemoglobin 11.8 (*)    HCT 34.6 (*)    All other components within normal limits  URINALYSIS COMPLETEWITH MICROSCOPIC ([facility] ONLY) - Abnormal; Notable for the following:    Color, Urine YELLOW (*)    APPearance CLEAR (*)    Ketones, ur TRACE (*)    Hgb urine dipstick 2+ (*)    Leukocytes, UA TRACE (*)    Squamous Epithelial / LPF 0-5 (*)    All other components within  normal limits  BASIC METABOLIC PANEL - Abnormal; Notable for the following:    Glucose, Bld 106 (*)    Creatinine, Ser 1.09 (*)    All other components within normal limits    RADIOLOGY   CT Pelvis W Contrast (Final result) Result time: 07/10/15 02:42:30   Final result by Rad Results In Interface (07/10/15 02:42:30)   Narrative:   CLINICAL DATA: Acute onset of chills, with generalized weakness and fever. Status post recent total hysterectomy, with oozing from the surgical site. Initial encounter.  EXAM: CT PELVIS WITH CONTRAST  TECHNIQUE: Multidetector CT imaging of the pelvis was performed using the standard protocol following the bolus administration of intravenous contrast.  CONTRAST: 130mL OMNIPAQUE IOHEXOL 300 MG/ML SOLN  COMPARISON: None.  FINDINGS: A complex relatively high attenuation collection is noted tracking along the entirety of the incision site, at the anterior pelvic wall, likely reflecting focal hematoma. The appearance is less typical for seroma or abscess. A single tiny focus of air is noted at the left side of the incision site.  Presacral stranding is nonspecific. Trace fluid at the pelvis is also nonspecific. The ovaries are relatively symmetric. No suspicious adnexal masses are identified. Visualized small and large bowel loops are grossly unremarkable. Visualized vasculature is unremarkable in  appearance.  The bladder is mildly distended. A small amount of air within the bladder may reflect recent Foley catheter placement.  No acute osseous abnormalities are identified.  IMPRESSION: 1. Complex relatively high attenuation collection tracking along the entirety of the incision site, at the anterior pelvic wall, likely reflecting focal hematoma. The appearance is less typical for seroma or abscess. Single tiny focus of air at the left side of the incision site. 2. Trace fluid at the pelvis is nonspecific and may be postoperative in nature, though depending on the patient's symptoms, mild abscess cannot be excluded. Presacral stranding noted.   Electronically Signed By: Garald Balding M.D. On: 07/10/2015 02:42          DG Chest Portable 1 View (Final result) Result time: 07/10/15 00:01:10   Final result by Rad Results In Interface (07/10/15 00:01:10)   Narrative:   CLINICAL DATA: 45 year old female with cough and fever  EXAM: PORTABLE CHEST 1 VIEW  COMPARISON: None.  FINDINGS: The heart size and mediastinal contours are within normal limits. Both lungs are clear. The visualized skeletal structures are unremarkable.  IMPRESSION: No active disease.   Electronically Signed By: Anner Crete M.D. On: 07/10/2015 00:01           INITIAL IMPRESSION / ASSESSMENT AND PLAN / ED COURSE  Pertinent labs & imaging results that were available during my care of the patient were reviewed by me and considered in my medical decision making (see chart for details).  Patient discussed with Dr. Marcelline Mates OB/GYN on-call for Dr. Enzo Bi with plan to start Keflex and patient follow up on the outpatient setting today. CT scan most likely consistent with hematoma per radiologist.  ____________________________________________   FINAL CLINICAL IMPRESSION(S) / ED DIAGNOSES  Final diagnoses:  Hematoma      Gregor Hams, MD 07/10/15 [phone]

## 2015-07-09 NOTE — ED Notes (Addendum)
Pt c/o chills, weeakness and fever at home.  Pt s/p total hysterectomy (retained ovaries) on 30 Jan at [facility], oozing from surgical site while in hosp started Wednesday but d/c'd Friday (3 Feb) from [place].  Pt states wound still oozing (described as bloody), pt had BM on Friday and passing flatus at home.  Pt denies ABX at home.  Hx of tachycardia /sp ablation, takes propanolol for such.

## 2015-07-10 ENCOUNTER — Observation Stay
Admission: RE | Admit: 2015-07-10 | Discharge: 2015-07-12 | Disposition: A | Discharge status: Home / Self Care | Payer: BLUE CROSS/BLUE SHIELD | Source: Ambulatory Visit | Attending: Obstetrics and Gynecology | Admitting: Obstetrics and Gynecology

## 2015-07-10 ENCOUNTER — Emergency Department: Payer: BLUE CROSS/BLUE SHIELD

## 2015-07-10 ENCOUNTER — Ambulatory Visit (INDEPENDENT_AMBULATORY_CARE_PROVIDER_SITE_OTHER): Payer: BLUE CROSS/BLUE SHIELD | Admitting: Obstetrics and Gynecology

## 2015-07-10 ENCOUNTER — Encounter: Payer: Self-pay | Admitting: Obstetrics and Gynecology

## 2015-07-10 VITALS — BP 103/68 | HR 81 | Temp 98.0°F | Ht 66.0 in | Wt 198.9 lb

## 2015-07-10 DIAGNOSIS — Z09 Encounter for follow-up examination after completed treatment for conditions other than malignant neoplasm: Secondary | ICD-10-CM

## 2015-07-10 DIAGNOSIS — Z90710 Acquired absence of both cervix and uterus: Secondary | ICD-10-CM

## 2015-07-10 DIAGNOSIS — IMO0002 Reserved for concepts with insufficient information to code with codable children: Secondary | ICD-10-CM | POA: Insufficient documentation

## 2015-07-10 DIAGNOSIS — L7632 Postprocedural hematoma of skin and subcutaneous tissue following other procedure: Secondary | ICD-10-CM

## 2015-07-10 DIAGNOSIS — S301XXA Contusion of abdominal wall, initial encounter: Secondary | ICD-10-CM | POA: Diagnosis present

## 2015-07-10 MED ORDER — LACTATED RINGERS IV SOLN: INTRAVENOUS | Status: DC

## 2015-07-10 MED ORDER — ACETAMINOPHEN 325 MG PO TABS
650.0000 mg | ORAL_TABLET | ORAL | Status: DC | PRN
  Administered 2015-07-10 – 2015-07-12 (×7): 650 mg via ORAL
  Filled 2015-07-10 (×7): qty 2

## 2015-07-10 MED ORDER — SODIUM CHLORIDE 0.9 % IV SOLN
3.0000 g | INTRAVENOUS | Status: AC
  Administered 2015-07-11: 3 g via INTRAVENOUS
  Filled 2015-07-10: qty 3

## 2015-07-10 MED ORDER — DEXTROSE 5 % IV SOLN
1.0000 g | INTRAVENOUS | Status: DC
  Administered 2015-07-10: 1 g via INTRAVENOUS
  Filled 2015-07-10 (×2): qty 10

## 2015-07-10 MED ORDER — ZOLPIDEM TARTRATE 5 MG PO TABS
5.0000 mg | ORAL_TABLET | Freq: Every evening | ORAL | Status: DC | PRN
  Administered 2015-07-10 – 2015-07-12 (×3): 5 mg via ORAL
  Filled 2015-07-10 (×3): qty 1

## 2015-07-10 MED ORDER — IOHEXOL 300 MG/ML  SOLN
100.0000 mL | Freq: Once | INTRAMUSCULAR | Status: AC | PRN
  Administered 2015-07-10: 100 mL via INTRAVENOUS

## 2015-07-10 MED ORDER — HYDROMORPHONE HCL 2 MG PO TABS
1.0000 mg | ORAL_TABLET | ORAL | Status: DC | PRN
  Administered 2015-07-10: 1 mg via ORAL
  Filled 2015-07-10: qty 1

## 2015-07-10 MED ORDER — CITRIC ACID-SODIUM CITRATE 334-500 MG/5ML PO SOLN
30.0000 mL | ORAL | Status: AC
  Administered 2015-07-11: 30 mL via ORAL
  Filled 2015-07-10: qty 30

## 2015-07-10 MED ORDER — CEPHALEXIN 500 MG PO CAPS
500.0000 mg | ORAL_CAPSULE | Freq: Once | ORAL | Status: AC
  Administered 2015-07-10: 500 mg via ORAL
  Filled 2015-07-10: qty 1

## 2015-07-10 MED ORDER — CEPHALEXIN 500 MG PO CAPS: 500.0000 mg | ORAL_CAPSULE | Freq: Two times a day (BID) | ORAL | Status: DC

## 2015-07-10 MED ORDER — LACTATED RINGERS IV SOLN
INTRAVENOUS | Status: DC
Start: 2015-07-10 — End: 2015-07-13
  Administered 2015-07-11 (×2): via INTRAVENOUS

## 2015-07-10 MED ORDER — PROPRANOLOL HCL ER 80 MG PO CP24
80.0000 mg | ORAL_CAPSULE | Freq: Two times a day (BID) | ORAL | Status: DC
  Administered 2015-07-10 – 2015-07-12 (×4): 80 mg via ORAL
  Filled 2015-07-10 (×4): qty 1

## 2015-07-10 NOTE — ED Notes (Signed)
2nd RN at bedside to attempt IV access 

## 2015-07-10 NOTE — H&P (Signed)
Subjective:    Patient is a 45 y.o. [id number] scheduled for abdominal incision hematoma evacuation. Indications for procedure are : Hematoma  Patient is 1 week status post TAH bilateral salpingectomy for chronic pelvic pain and abnormal uterine bleeding. Pathology from surgery demonstrated fibroids and adenomyosis. Patient developed right-sided incisional oozing with subsequent workup demonstrating wound hematoma across the entire incision; CT scan confirmed findings; white blood cell count 7.3; temperature 98.3 when evaluated last p.m Because of the patient's significant discomfort and extent of hematoma, she was admitted for IV antibiotics and wound hematoma evacuation.   Pertinent Gynecological History: Menses: N/A Bleeding: Incisional bleeding    Menstrual History: OB History    Gravida Para Term Preterm AB TAB SAB Ectopic Multiple Living   8 3 1 2 5 1 4   1       Menarche age: Not applicable Patient's last menstrual period was 06/26/2015 (exact date).    Past Medical History  Diagnosis Date  . Tachycardia   . Hyperlipemia   . GERD (gastroesophageal reflux disease)   . Fibroid   . Headache     H/O  . Anemia     H/O   . Shortness of breath dyspnea     ASSOCIATED WITH TACHYCARDIA  . Anxiety   . Dysrhythmia     SVT-HAD CARDIAC ABLATION DONE IN 2016 BUT PT STATES SHE STILL GETS INTERMITTENT TACHYCARDIC AND IS SYPMPTOMATIC WITH SOB, DIZZINESS DURING TACHY EPISODES (06-25-15)    Past Surgical History  Procedure Laterality Date  . Tubal ligation    . Cesarean section    . Appendectomy    . Cholecystectomy    . Cervical cerclage      McDonald cerclage 4; Shirodkar cerclage 1  . Nissen fundoplication    . Fibroids removed    . Novasure ablation    . Dilation and curettage of uterus    . Cardiac electrophysiology study and ablation    . Abdominal hysterectomy Bilateral 07/02/2015    Procedure: HYSTERECTOMY ABDOMINAL WITH BS;  Surgeon: Brayton Mars, MD;   Location: [facility] ORS;  Service: Gynecology;  Laterality: Bilateral;  . Cystoscopy Bilateral 07/02/2015    Procedure: CYSTOSCOPY;  Surgeon: Brayton Mars, MD;  Location: [facility] ORS;  Service: Gynecology;  Laterality: Bilateral;    OB History  Gravida Para Term Preterm AB SAB TAB Ectopic Multiple Living  8 3 1 2 5 4 1   1     # Outcome Date GA Lbr Len/2nd Weight Sex Delivery Anes PTL Lv  8 SAB 2003          7 Preterm 2001        FD  6 Term 1998   4 lb 2.1 oz (1.873 kg) F Vag-Spont   Y  5 Preterm 1996        FD  4 SAB 1994          3 SAB 1992          2 TAB 1991          1 SAB 1990              Social History   Social History  . Marital Status: Married    Spouse Name: N/A  . Number of Children: N/A  . Years of Education: N/A   Social History Main Topics  . Smoking status: Never Smoker   . Smokeless tobacco: Not on file  . Alcohol Use: No  . Drug Use: No  . Sexual Activity:  Yes    Birth Control/ Protection: Surgical   Other Topics Concern  . Not on file   Social History Narrative    Family History  Problem Relation Age of Onset  . Diabetes Mother   . Prostate cancer Father   . Congestive Heart Failure Father   . Cancer Neg Hx     Prescriptions prior to admission  Medication Sig Dispense Refill Last Dose  . atorvastatin (LIPITOR) 20 MG tablet TK 1 T PO Q NIGHT   Taking  . Biotin 1 MG CAPS Take by mouth.   Taking  . cephALEXin (KEFLEX) 500 MG capsule Take 1 capsule (500 mg total) by mouth 2 (two) times daily. 20 capsule 0 Taking  . docusate sodium (COLACE) 100 MG capsule Take 1 capsule (100 mg total) by mouth 2 (two) times daily. 30 capsule 0 Taking  . HYDROmorphone (DILAUDID) 2 MG tablet Take 1 tablet (2 mg total) by mouth every 3 (three) hours as needed for moderate pain or severe pain. 30 tablet 0 Taking  . Multiple Vitamins-Minerals (MULTIVITAMIN ADULT PO) Take by mouth.   Taking  . promethazine (PHENERGAN) 25 MG tablet Take 1 tablet (25 mg total) by mouth  every 6 (six) hours as needed for nausea. 15 tablet 0 Taking  . propranolol (INDERAL) 20 MG tablet TK 1 T PO PRN   Taking  . propranolol ER (INDERAL LA) 80 MG 24 hr capsule TK ONE C PO BID   Taking    Allergies  Allergen Reactions  . Codeine Shortness Of Breath  . Hydrocodone-Acetaminophen Rash  . Adhesive [Tape]     ELECTRODES FROM HOLTER MONITOR CAUSED RASH  . Aspirin Other (See Comments)    Stomach issues  . Diphenhydramine Hcl Other (See Comments)    Through an I.V. drip    Review of Systems Constitutional: Patient reports temperature 101F last night Respiratory: No recent cough/bronchitis Cardiovascular: No chest pain Gastrointestinal: No recent nausea/vomiting/diarrhea; history of incisional bleeding; history of abdominal pain, incisional Genitourinary: No UTI symptoms Hematologic/lymphatic: History of PE during pregnancy; history of perioperative subcutaneous heparin prophylaxis   Objective:    LMP 06/26/2015 (Exact Date)  General:   Normal  Skin:   normal  HEENT:  Normal  Neck:  Supple without Adenopathy or Thyromegaly  Lungs:   Heart:              Breasts:   Abdomen:  Pelvis:  M/S   Extremeties:  Neuro:    clear to auscultation bilaterally   Normal without murmur   Not Examined   Staples from Pfannenstiel incision removed; small amount of clot through open incision noted    Exam deferred to OR  No CVAT  Warm/Dry   Normal          Assessment:    1. One week status post TAH bilateral salpingectomy 2. Incisional hematoma 3. History of fever   Plan:   1. Admit for analgesia, IV antibiotics, and wound evacuation in same day surgery 2. History of prior pulmonary embolus with recent prophylaxis for DVT perioperatively  Brayton Mars, MD  Note: This dictation was prepared with Dragon dictation along with smaller phrase technology. Any transcriptional errors that result from this process are unintentional.

## 2015-07-10 NOTE — Progress Notes (Signed)
Chief: 1.  One week postop check. 2.  Status post TAH, bilateral salpingectomy. 3.  Wound hematoma.  Patient presents for her one-week postop check.  She went to Hospital ER last night for evaluation due to fever and pain.  Subsequent workup demonstrated incisional hematoma on CT scan; white blood cell count 7.3; temperature 98.3.  She presents now for follow-up.  OBJECTIVE: BP 103/68 mmHg  Pulse 81  Temp(Src) 98 F (36.7 C)  Ht 5\' 6"  (1.676 m)  Wt 198 lb 14.4 oz (90.22 kg)  BMI 32.12 kg/m2  LMP 06/26/2015 (Exact Date) Anxious, tearful African-American female. Abdomen: Pfannenstiel incision with staples intact, removed.  Incision demonstrates some clot upon palpation/expression.  Patient unable to tolerate wound exploration in office  ASSESSMENT: 1.  One week postop TAH, bilateral salpingectomy. 2.  Perioperative prophylaxis with heparin because of history of pulmonary embolus, pregnant. 3.  Incisional hematoma on CT scan. 4.  Fever last night.  PLAN: 1.  Admit to hospital for hematoma evacuation in OR. 2.  IV fluids with IV antibiotics and analgesics as needed.  Perioperatively.  Brayton Mars, MD  Note: This dictation was prepared with Dragon dictation along with smaller phrase technology. Any transcriptional errors that result from this process are unintentional.

## 2015-07-11 ENCOUNTER — Encounter
Admission: RE | Disposition: A | Discharge status: Home / Self Care | Payer: Self-pay | Source: Ambulatory Visit | Attending: Obstetrics and Gynecology

## 2015-07-11 ENCOUNTER — Observation Stay: Payer: BLUE CROSS/BLUE SHIELD | Admitting: Anesthesiology

## 2015-07-11 ENCOUNTER — Encounter: Payer: Self-pay | Admitting: Anesthesiology

## 2015-07-11 DIAGNOSIS — L7632 Postprocedural hematoma of skin and subcutaneous tissue following other procedure: Secondary | ICD-10-CM

## 2015-07-11 HISTORY — PX: LAPAROTOMY: SHX154

## 2015-07-11 SURGERY — LAPAROTOMY, EXPLORATORY
Anesthesia: General | Site: Abdomen | Wound class: Clean Contaminated

## 2015-07-11 SURGERY — LAPAROTOMY, EXPLORATORY
Anesthesia: Choice

## 2015-07-11 MED ORDER — SODIUM CHLORIDE 0.9 % IJ SOLN
INTRAMUSCULAR | Status: AC
  Filled 2015-07-11: qty 10

## 2015-07-11 MED ORDER — HYDROMORPHONE HCL 2 MG PO TABS: 2.0000 mg | ORAL_TABLET | ORAL | Status: DC | PRN

## 2015-07-11 MED ORDER — LACTATED RINGERS IV SOLN
INTRAVENOUS | Status: DC
  Administered 2015-07-11: 12:00:00 via INTRAVENOUS

## 2015-07-11 MED ORDER — DOCUSATE SODIUM 100 MG PO CAPS
100.0000 mg | ORAL_CAPSULE | Freq: Two times a day (BID) | ORAL | Status: DC
  Administered 2015-07-11 – 2015-07-12 (×3): 100 mg via ORAL
  Filled 2015-07-11 (×3): qty 1

## 2015-07-11 MED ORDER — FENTANYL CITRATE (PF) 100 MCG/2ML IJ SOLN
INTRAMUSCULAR | Status: AC
  Administered 2015-07-11: 50 ug via INTRAVENOUS
  Filled 2015-07-11: qty 2

## 2015-07-11 MED ORDER — FENTANYL CITRATE (PF) 100 MCG/2ML IJ SOLN
INTRAMUSCULAR | Status: DC | PRN
  Administered 2015-07-11: 100 ug via INTRAVENOUS
  Administered 2015-07-11 (×2): 50 ug via INTRAVENOUS

## 2015-07-11 MED ORDER — SUGAMMADEX SODIUM 200 MG/2ML IV SOLN
INTRAVENOUS | Status: DC | PRN
  Administered 2015-07-11: 180.4 mg via INTRAVENOUS

## 2015-07-11 MED ORDER — HYDROMORPHONE HCL 1 MG/ML IJ SOLN
0.5000 mg | INTRAMUSCULAR | Status: AC | PRN
  Administered 2015-07-11 (×4): 0.5 mg via INTRAVENOUS

## 2015-07-11 MED ORDER — ACETAMINOPHEN 10 MG/ML IV SOLN: INTRAVENOUS | Status: DC | PRN

## 2015-07-11 MED ORDER — ACETAMINOPHEN 10 MG/ML IV SOLN
INTRAVENOUS | Status: AC
  Filled 2015-07-11: qty 100

## 2015-07-11 MED ORDER — SUCCINYLCHOLINE CHLORIDE 20 MG/ML IJ SOLN
INTRAMUSCULAR | Status: DC | PRN
  Administered 2015-07-11: 120 mg via INTRAVENOUS

## 2015-07-11 MED ORDER — HYDROMORPHONE HCL 1 MG/ML IJ SOLN
INTRAMUSCULAR | Status: AC
  Administered 2015-07-11: 0.5 mg via INTRAVENOUS
  Filled 2015-07-11: qty 1

## 2015-07-11 MED ORDER — BISACODYL 10 MG RE SUPP: 10.0000 mg | Freq: Every day | RECTAL | Status: DC | PRN

## 2015-07-11 MED ORDER — FENTANYL CITRATE (PF) 100 MCG/2ML IJ SOLN
25.0000 ug | INTRAMUSCULAR | Status: DC | PRN
  Administered 2015-07-11 (×3): 50 ug via INTRAVENOUS

## 2015-07-11 MED ORDER — MIDAZOLAM HCL 2 MG/2ML IJ SOLN
INTRAMUSCULAR | Status: DC | PRN
  Administered 2015-07-11: 2 mg via INTRAVENOUS

## 2015-07-11 MED ORDER — PROMETHAZINE HCL 25 MG/ML IJ SOLN
INTRAMUSCULAR | Status: AC
  Administered 2015-07-11: 12.5 mg via INTRAVENOUS
  Filled 2015-07-11: qty 1

## 2015-07-11 MED ORDER — SIMETHICONE 80 MG PO CHEW
80.0000 mg | CHEWABLE_TABLET | Freq: Four times a day (QID) | ORAL | Status: DC | PRN
Start: 2015-07-11 — End: 2015-07-13

## 2015-07-11 MED ORDER — ONDANSETRON HCL 4 MG/2ML IJ SOLN
INTRAMUSCULAR | Status: DC | PRN
  Administered 2015-07-11 (×2): 4 mg via INTRAVENOUS

## 2015-07-11 MED ORDER — ACETAMINOPHEN 325 MG PO TABS: 650.0000 mg | ORAL_TABLET | ORAL | Status: DC | PRN

## 2015-07-11 MED ORDER — PHENYLEPHRINE HCL 10 MG/ML IJ SOLN
INTRAMUSCULAR | Status: DC | PRN
  Administered 2015-07-11: 100 ug via INTRAVENOUS

## 2015-07-11 MED ORDER — ROCURONIUM BROMIDE 100 MG/10ML IV SOLN
INTRAVENOUS | Status: DC | PRN
  Administered 2015-07-11: 40 mg via INTRAVENOUS
  Administered 2015-07-11: 10 mg via INTRAVENOUS

## 2015-07-11 MED ORDER — MORPHINE SULFATE (PF) 2 MG/ML IV SOLN
1.0000 mg | INTRAVENOUS | Status: DC | PRN
  Administered 2015-07-11 – 2015-07-12 (×10): 2 mg via INTRAVENOUS
  Filled 2015-07-11 (×10): qty 1

## 2015-07-11 MED ORDER — PROMETHAZINE HCL 25 MG/ML IJ SOLN
6.2500 mg | INTRAMUSCULAR | Status: AC | PRN
  Administered 2015-07-11 (×2): 12.5 mg via INTRAVENOUS

## 2015-07-11 MED ORDER — SODIUM CHLORIDE 0.9 % IV SOLN
3.0000 g | Freq: Three times a day (TID) | INTRAVENOUS | Status: AC
  Administered 2015-07-11 – 2015-07-12 (×3): 3 g via INTRAVENOUS
  Filled 2015-07-11 (×5): qty 3

## 2015-07-11 MED ORDER — DEXAMETHASONE SODIUM PHOSPHATE 10 MG/ML IJ SOLN
INTRAMUSCULAR | Status: DC | PRN
  Administered 2015-07-11: 10 mg via INTRAVENOUS

## 2015-07-11 MED ORDER — PROPOFOL 10 MG/ML IV BOLUS
INTRAVENOUS | Status: DC | PRN
  Administered 2015-07-11: 170 mg via INTRAVENOUS

## 2015-07-11 MED ORDER — LIDOCAINE HCL (CARDIAC) 20 MG/ML IV SOLN
INTRAVENOUS | Status: DC | PRN
  Administered 2015-07-11: 100 mg via INTRAVENOUS

## 2015-07-11 MED ORDER — SODIUM CHLORIDE 0.9 % IJ SOLN
INTRAMUSCULAR | Status: AC
  Administered 2015-07-11: 10 mL
  Filled 2015-07-11: qty 10

## 2015-07-11 SURGICAL SUPPLY — 17 items
CANISTER SUCT 1200ML W/VALVE (MISCELLANEOUS) ×3 IMPLANT
CATH TRAY 16F METER LATEX (MISCELLANEOUS) ×3 IMPLANT
CHLORAPREP W/TINT 26ML (MISCELLANEOUS) ×3 IMPLANT
DRSG TELFA 3X8 NADH (GAUZE/BANDAGES/DRESSINGS) ×3 IMPLANT
GAUZE SPONGE 4X4 12PLY STRL (GAUZE/BANDAGES/DRESSINGS) ×3 IMPLANT
GLOVE BIO SURGEON STRL SZ8 (GLOVE) ×3 IMPLANT
GOWN STRL REUS W/ TWL LRG LVL3 (GOWN DISPOSABLE) ×2 IMPLANT
GOWN STRL REUS W/TWL LRG LVL3 (GOWN DISPOSABLE) ×4
KIT RM TURNOVER STRD PROC AR (KITS) ×3 IMPLANT
LABEL OR SOLS (LABEL) ×3 IMPLANT
NS IRRIG 1000ML POUR BTL (IV SOLUTION) ×3 IMPLANT
PACK BASIN MAJOR ARMC (MISCELLANEOUS) IMPLANT
SPONGE LAP 18X18 5 PK (GAUZE/BANDAGES/DRESSINGS) ×3 IMPLANT
STAPLER SKIN PROX 35W (STAPLE) ×3 IMPLANT
SUT CHROMIC 0 CT 1 (SUTURE) IMPLANT
SUT CHROMIC GUT 1-0 18 CT-1 (SUTURE) IMPLANT
SUT MAXON ABS #0 GS21 30IN (SUTURE) IMPLANT

## 2015-07-11 SURGICAL SUPPLY — 19 items
CANISTER SUCT 1200ML W/VALVE (MISCELLANEOUS) ×2 IMPLANT
CATH TRAY 16F METER LATEX (MISCELLANEOUS) IMPLANT
CHLORAPREP W/TINT 26ML (MISCELLANEOUS) ×2 IMPLANT
DRAPE LAPAROTOMY TRNSV 106X77 (MISCELLANEOUS) ×2 IMPLANT
DRSG TELFA 3X8 NADH (GAUZE/BANDAGES/DRESSINGS) ×2 IMPLANT
GAUZE SPONGE 4X4 12PLY STRL (GAUZE/BANDAGES/DRESSINGS) ×2 IMPLANT
GLOVE BIO SURGEON STRL SZ8 (GLOVE) ×2 IMPLANT
GOWN STRL REUS W/ TWL LRG LVL3 (GOWN DISPOSABLE) ×2 IMPLANT
GOWN STRL REUS W/TWL LRG LVL3 (GOWN DISPOSABLE) ×2
KIT RM TURNOVER STRD PROC AR (KITS) ×2 IMPLANT
LABEL OR SOLS (LABEL) ×2 IMPLANT
NS IRRIG 1000ML POUR BTL (IV SOLUTION) ×2 IMPLANT
PACK BASIN MAJOR ARMC (MISCELLANEOUS) ×2 IMPLANT
SPONGE LAP 18X18 5 PK (GAUZE/BANDAGES/DRESSINGS) IMPLANT
STAPLER SKIN PROX 35W (STAPLE) ×2 IMPLANT
SUT CHROMIC 0 CT 1 (SUTURE) ×4 IMPLANT
SUT CHROMIC GUT 1-0 18 CT-1 (SUTURE) ×4 IMPLANT
SUT MAXON ABS #0 GS21 30IN (SUTURE) ×4 IMPLANT
SUT PROLENE 2 TP 1 (SUTURE) ×4 IMPLANT

## 2015-07-11 NOTE — Transfer of Care (Signed)
Immediate Anesthesia Transfer of Care Note  Patient: Brittany Baldwin  Procedure(s) Performed: Procedure(s): hematoma evacuation (N/A)  Patient Location: PACU  Anesthesia Type:General  Level of Consciousness: sedated  Airway & Oxygen Therapy: Patient Spontanous Breathing and Patient connected to face mask oxygen  Post-op Assessment: Report given to RN and Post -op Vital signs reviewed and stable  Post vital signs: Reviewed and stable  Last Vitals:  Filed Vitals:   07/11/15 0300 07/11/15 0552  BP: 86/54 94/59  Pulse: 86 81  Temp: 37.1 C 36.7 C  Resp: 20 18    Complications: No apparent anesthesia complications

## 2015-07-11 NOTE — Anesthesia Procedure Notes (Signed)
Procedure Name: Intubation Date/Time: 07/11/2015 8:16 AM Performed by: Nelda Marseille Pre-anesthesia Checklist: Patient identified, Patient being monitored, Timeout performed, Emergency Drugs available and Suction available Patient Re-evaluated:Patient Re-evaluated prior to inductionOxygen Delivery Method: Circle system utilized Preoxygenation: Pre-oxygenation with 100% oxygen Intubation Type: IV induction Ventilation: Mask ventilation without difficulty Laryngoscope Size: Mac and 3 Grade View: Grade I Tube type: Oral Tube size: 7.0 mm Number of attempts: 1 Airway Equipment and Method: Stylet Placement Confirmation: ETT inserted through vocal cords under direct vision,  positive ETCO2 and breath sounds checked- equal and bilateral Secured at: 21 cm Tube secured with: Tape Dental Injury: Teeth and Oropharynx as per pre-operative assessment

## 2015-07-11 NOTE — Progress Notes (Signed)
Notified Garnette Gunner, Rn of bp

## 2015-07-11 NOTE — Anesthesia Preprocedure Evaluation (Signed)
Anesthesia Evaluation  Patient identified by MRN, date of birth, ID band Patient awake    Reviewed: Allergy & Precautions, NPO status , Patient's Chart, lab work & pertinent test results, reviewed documented beta blocker date and time   History of Anesthesia Complications Negative for: history of anesthetic complications  Airway Mallampati: II  TM Distance: >3 FB Neck ROM: Full    Dental  (+) Caps Bridge:   Pulmonary neg pulmonary ROS,    Pulmonary exam normal breath sounds clear to auscultation       Cardiovascular (-) hypertension(-) angina(-) CAD, (-) Past MI, (-) Cardiac Stents and (-) CABG Normal cardiovascular exam+ dysrhythmias Supra Ventricular Tachycardia (-) Valvular Problems/Murmurs     Neuro/Psych  Headaches, Anxiety    GI/Hepatic Neg liver ROS, GERD  Medicated and Controlled,  Endo/Other  negative endocrine ROS  Renal/GU negative Renal ROS  negative genitourinary   Musculoskeletal negative musculoskeletal ROS (+)   Abdominal Normal abdominal exam  (+)   Peds negative pediatric ROS (+)  Hematology  (+) anemia ,   Anesthesia Other Findings   Reproductive/Obstetrics                             Anesthesia Physical  Anesthesia Plan  ASA: II  Anesthesia Plan: General   Post-op Pain Management:    Induction: Intravenous  Airway Management Planned: Oral ETT  Additional Equipment:   Intra-op Plan:   Post-operative Plan: Extubation in OR  Informed Consent: I have reviewed the patients History and Physical, chart, labs and discussed the procedure including the risks, benefits and alternatives for the proposed anesthesia with the patient or authorized representative who has indicated his/her understanding and acceptance.   Dental advisory given  Plan Discussed with: CRNA and Surgeon  Anesthesia Plan Comments:         Anesthesia Quick Evaluation

## 2015-07-11 NOTE — Op Note (Signed)
OPERATIVE NOTE:  Brittany Baldwin PROCEDURE DATE: 07/11/2015   PREOPERATIVE DIAGNOSIS:  1. Postop hematoma, status post TAH bilateral salpingectomy 2. History of prophylaxis 3. Prior History of PE  POSTOPERATIVE DIAGNOSIS: Same as above  PROCEDURE: Hematoma evacuation, abdominal incision  SURGEON:  Brayton Mars, MD ASSISTANTS: Dr. Marcelline Mates and PA-S Loni Muse ANESTHESIA: General INDICATIONS: 45 y.o. [id number] 9 days status post TVH bilateral salpingectomy for fibroids and adenomyosis, status post perioperative prophylaxis with subcutaneous heparin for prior history of PE, presents for evacuation of abdominal wall hematoma from Pfannenstiel incision.  FINDINGS:  30 cc of clot evacuated from incision; good granulation tissue present; no evidence of infection   I/O's:   COUNTS:  YES SPECIMENS: None ANTIBIOTIC PROPHYLAXIS:Unasyn 3 g COMPLICATIONS: None immediate  PROCEDURE IN DETAIL: Patient was brought to the operating room where she was placed in supine position. General endotracheal anesthesia was induced without difficulty. A ChloraPrep abdominal prep and drape was performed in standard fashion. The incision was opened and 30 cc of hematoma was evacuated from the subcutaneous aspect of the incision. Fascia was noted to be intact. Soft tissues demonstrated no evidence of active bleeding or infection. The wound was irrigated copiously with normal saline. Hemostasis was noted to be excellent. The wound was closed with 4 sutures of #2 Prolene along with staples. Telfa and pressure dressing was applied. Patient was then awakened and spent taking the recovery room in satisfactory condition Estimated blood loss: Minimal; 30 cc of clot removed All instruments needles and  sponge counts were verified as correct.  Brittany A. [name], MD, ACOG [facility]

## 2015-07-11 NOTE — Anesthesia Postprocedure Evaluation (Signed)
Anesthesia Post Note  Patient: Brittany Baldwin  Procedure(s) Performed: Procedure(s) (LRB): hematoma evacuation (N/A)  Patient location during evaluation: PACU Anesthesia Type: General Level of consciousness: awake and alert Pain management: pain level controlled Vital Signs Assessment: post-procedure vital signs reviewed and stable Respiratory status: spontaneous breathing, nonlabored ventilation, respiratory function stable and patient connected to nasal cannula oxygen Cardiovascular status: blood pressure returned to baseline and stable Postop Assessment: no signs of nausea or vomiting Anesthetic complications: no    Last Vitals:  Filed Vitals:   07/11/15 1403 07/11/15 1649  BP: 99/55 98/54  Pulse: 79 78  Temp: 36.6 C 36.5 C  Resp: 12 16    Last Pain:  Filed Vitals:   07/11/15 1651  PainSc: 4                  Martha Clan

## 2015-07-11 NOTE — Interval H&P Note (Signed)
History and Physical Interval Note:  07/11/2015 7:33 AM  Brittany Baldwin  has presented today for surgery, with the diagnosis of HEMATOMA S/P HYSTERECTOMY  The various methods of treatment have been discussed with the patient and family. After consideration of risks, benefits and other options for treatment, the patient has consented to  Procedure(s): EXPLORATORY LAPAROTOMY FOR HEMATOMA S/P HYSTERECTOMY (N/A) as a surgical intervention .  The patient's history has been reviewed, patient examined, no change in status, stable for surgery.  I have reviewed the patient's chart and labs.  Questions were answered to the patient's satisfaction.     Hassell Done A Jayveion Stalling

## 2015-07-12 NOTE — Discharge Summary (Signed)
Physician Discharge Summary  Patient ID: Brittany Baldwin MRN: [id number] DOB/AGE: Aug 08, 1970 45 y.o.  Admit date: 07/10/2015 Discharge date: 07/12/2015  Admission Diagnoses: Post op Hematoma; status post TAH-Bilateral salpingectomy  Discharge Diagnoses:  same as above  Operative Procedures: Procedure(s): hematoma evacuation (N/A)  Hospital Course: Uncomplicated   Significant Diagnostic Studies:  Lab Results  Component Value Date   HGB 11.8* 07/09/2015   HGB 11.6* 07/03/2015   HGB 13.0 06/26/2015   Lab Results  Component Value Date   HCT 34.6* 07/09/2015   HCT 39.5 06/26/2015   CBC Latest Ref Rng 07/09/2015 07/03/2015 06/26/2015  WBC 3.6 - 11.0 K/uL 7.9 - 6.4  Hemoglobin 12.0 - 16.0 g/dL 11.8(L) 11.6(L) 13.0  Hematocrit 35.0 - 47.0 % 34.6(L) - 39.5  Platelets 150 - 440 K/uL 266 176 191     Discharged Condition: good   Discharge Exam: Blood pressure 95/61, pulse 86, temperature 98.7 F (37.1 C), temperature source Oral, resp. rate 20, last menstrual period 06/26/2015, SpO2 97 %. Incision/Wound: clean, dry and no drainage  Disposition: 01-Home or Self Care     Medication List    ASK your doctor about these medications        atorvastatin 20 MG tablet  Commonly known as:  LIPITOR  TK 1 T PO Q NIGHT     Biotin 1 MG Caps  Take by mouth.     cephALEXin 500 MG capsule  Commonly known as:  KEFLEX  Take 1 capsule (500 mg total) by mouth 2 (two) times daily.     docusate sodium 100 MG capsule  Commonly known as:  COLACE  Take 1 capsule (100 mg total) by mouth 2 (two) times daily.     HYDROmorphone 2 MG tablet  Commonly known as:  DILAUDID  Take 1 tablet (2 mg total) by mouth every 3 (three) hours as needed for moderate pain or severe pain.     MULTIVITAMIN ADULT PO  Take by mouth.     promethazine 25 MG tablet  Commonly known as:  PHENERGAN  Take 1 tablet (25 mg total) by mouth every 6 (six) hours as needed for nausea.     propranolol 20 MG tablet  Commonly  known as:  INDERAL  TK 1 T PO PRN     propranolol ER 80 MG 24 hr capsule  Commonly known as:  INDERAL LA  TK ONE C PO BID         Signed: Alanda Slim Selden Noteboom 07/12/2015, 1:30 PM

## 2015-07-12 NOTE — Progress Notes (Signed)
Discharge instructions reviewed with patient, patient verbalized understanding.

## 2015-07-16 ENCOUNTER — Encounter: Payer: Self-pay | Admitting: Obstetrics and Gynecology

## 2015-07-16 ENCOUNTER — Ambulatory Visit (INDEPENDENT_AMBULATORY_CARE_PROVIDER_SITE_OTHER): Payer: BLUE CROSS/BLUE SHIELD | Admitting: Obstetrics and Gynecology

## 2015-07-16 VITALS — BP 107/69 | HR 88 | Temp 98.2°F | Ht 66.0 in | Wt 199.8 lb

## 2015-07-16 DIAGNOSIS — Z09 Encounter for follow-up examination after completed treatment for conditions other than malignant neoplasm: Secondary | ICD-10-CM

## 2015-07-18 ENCOUNTER — Encounter: Payer: Self-pay | Admitting: Obstetrics and Gynecology

## 2015-07-18 ENCOUNTER — Ambulatory Visit (INDEPENDENT_AMBULATORY_CARE_PROVIDER_SITE_OTHER): Payer: BLUE CROSS/BLUE SHIELD | Admitting: Obstetrics and Gynecology

## 2015-07-18 VITALS — BP 109/69 | HR 83 | Ht 66.0 in | Wt 199.4 lb

## 2015-07-18 DIAGNOSIS — Z09 Encounter for follow-up examination after completed treatment for conditions other than malignant neoplasm: Secondary | ICD-10-CM

## 2015-07-18 NOTE — Patient Instructions (Signed)
1.  Continue with local wound care instructions. 2.  Return in 2 days for staple removal

## 2015-07-18 NOTE — Patient Instructions (Signed)
1. Staples and sutures are removed today. Steri-Strips are applied. 2. Keep wound clean and dry; may shower. 3. Return in 1 week for incision check

## 2015-07-18 NOTE — Progress Notes (Signed)
Chief complaint: 1.  Wound check.  Patient presents for 5 day follow-up  After hematoma evacuation of Pfannenstiel incision following hysterectomy.  She has noted some mild greenish drainage from incision.  OBJECTIVE: BP 107/69 mmHg  Pulse 88  Temp(Src) 98.2 F (36.8 C)  Ht 5\' 6"  (1.676 m)  Wt 199 lb 12.8 oz (90.629 kg)  BMI 32.26 kg/m2  LMP 06/26/2015 (Exact Date) Pleasant African American female, slightly anxious. Abdomen: Pfannenstiel incision is well approximated with staples  As well as the retention sutures.  Minimal crusting, serous, is noted  Along the incision margins.  No evidence of induration, erythema, or fluctuance.  ASSESSMENT: 1.  Normal incision check.  5 days status post hematoma evacuation.  PLAN: 1.  Return in 2 days for staple removal as scheduled. 2.  Continue with local wound care instructions.  Brayton Mars, MD  Note: This dictation was prepared with Dragon dictation along with smaller phrase technology. Any transcriptional errors that result from this process are unintentional.

## 2015-07-18 NOTE — Progress Notes (Signed)
Chief complaint: 1. Incision check 2. One-week status post hematoma evacuation  Patient presents for wound check. Minimal drainage was noted from incision. Sutures are annoying to the patient.  OBJECTIVE: BP 109/69 mmHg  Pulse 83  Ht 5\' 6"  (1.676 m)  Wt 199 lb 6.4 oz (90.447 kg)  BMI 32.20 kg/m2  LMP 06/26/2015 (Exact Date)  Incision-well approximated with staples and sutures. No evidence of infection.  Staples removed. Sutures removed. Steri-Strips applied.  ASSESSMENT: 1. Normal wound healing  PLAN: 1. Staples removed. Sutures removed. Steri-Strips applied. 2. Local wound care 3. Return in 1 week for incision check  Brayton Mars, MD  Note: This dictation was prepared with Dragon dictation along with smaller phrase technology. Any transcriptional errors that result from this process are unintentional.

## 2015-07-24 ENCOUNTER — Telehealth: Payer: Self-pay | Admitting: Obstetrics and Gynecology

## 2015-07-24 NOTE — Telephone Encounter (Signed)
Pt c/o of incision slightly oozing on left side. Drainage is a mix of blood, clear, white, and green. NO foul odor, fevers, redness, or warm to touch. Steri strips have fallen off. Advised pt to clean with hydrogen peroxide and normal saline. Keep area clean and dry. If sx gets worse we will need to see her before f/u on Thursday.

## 2015-07-24 NOTE — Telephone Encounter (Signed)
Part of her incision is not closing up. She has an appt Thursday but wondered if she should come in sooner.

## 2015-07-26 ENCOUNTER — Ambulatory Visit (INDEPENDENT_AMBULATORY_CARE_PROVIDER_SITE_OTHER): Payer: BLUE CROSS/BLUE SHIELD | Admitting: Obstetrics and Gynecology

## 2015-07-26 ENCOUNTER — Encounter: Payer: Self-pay | Admitting: Obstetrics and Gynecology

## 2015-07-26 VITALS — BP 100/68 | HR 89 | Ht 66.0 in | Wt 201.1 lb

## 2015-07-26 DIAGNOSIS — L7632 Postprocedural hematoma of skin and subcutaneous tissue following other procedure: Secondary | ICD-10-CM

## 2015-07-26 DIAGNOSIS — Z09 Encounter for follow-up examination after completed treatment for conditions other than malignant neoplasm: Secondary | ICD-10-CM

## 2015-07-26 NOTE — Patient Instructions (Signed)
1. Keep incision clean/ dry 2. Use 4 x 4's to protect [clothes from serous drainage 3. Return in 3 weeks for final postoperative check

## 2015-07-26 NOTE — Progress Notes (Signed)
Chief complaint: 1. Incision check 2. 2-1/2 weeks status post hematoma evacuation from Pfannenstiel incision  Minimal serous drainage from the left lateral margin of the incision where there is 1.5 cm length of skin overlap. No induration. No purulent drainage.  ASSESSMENT: 1. Normal incision check 2. Minimal area of granulation tissue/skin overlap on left lateral aspect of wound  PLAN: 1. Keep incision clean and dry 2. Use 4 x 4 pads to protect her clothing from serous drainage 3. Return in 3 weeks for final postop check  Brayton Mars, MD  Note: This dictation was prepared with Dragon dictation along with smaller phrase technology. Any transcriptional errors that result from this process are unintentional.

## 2015-08-15 ENCOUNTER — Encounter: Payer: Self-pay | Admitting: Obstetrics and Gynecology

## 2015-08-15 ENCOUNTER — Ambulatory Visit (INDEPENDENT_AMBULATORY_CARE_PROVIDER_SITE_OTHER): Payer: BLUE CROSS/BLUE SHIELD | Admitting: Obstetrics and Gynecology

## 2015-08-15 VITALS — BP 102/66 | HR 91 | Ht 66.0 in | Wt 205.0 lb

## 2015-08-15 DIAGNOSIS — Z8759 Personal history of other complications of pregnancy, childbirth and the puerperium: Secondary | ICD-10-CM

## 2015-08-15 DIAGNOSIS — R300 Dysuria: Secondary | ICD-10-CM

## 2015-08-15 DIAGNOSIS — Z86711 Personal history of pulmonary embolism: Secondary | ICD-10-CM

## 2015-08-15 DIAGNOSIS — Z09 Encounter for follow-up examination after completed treatment for conditions other than malignant neoplasm: Secondary | ICD-10-CM

## 2015-08-15 DIAGNOSIS — L7632 Postprocedural hematoma of skin and subcutaneous tissue following other procedure: Secondary | ICD-10-CM

## 2015-08-15 LAB — POCT URINALYSIS DIPSTICK
Bilirubin, UA: NEGATIVE
Glucose, UA: NEGATIVE
KETONES UA: NEGATIVE
LEUKOCYTES UA: NEGATIVE
Nitrite, UA: NEGATIVE
PH UA: 7
PROTEIN UA: NEGATIVE
SPEC GRAV UA: 1.01
UROBILINOGEN UA: 0.2

## 2015-08-16 ENCOUNTER — Encounter: Payer: BLUE CROSS/BLUE SHIELD | Admitting: Obstetrics and Gynecology

## 2015-08-18 LAB — URINE CULTURE: Organism ID, Bacteria: NO GROWTH

## 2015-08-21 ENCOUNTER — Telehealth: Payer: Self-pay | Admitting: Obstetrics and Gynecology

## 2015-08-21 NOTE — Telephone Encounter (Signed)
Pt is aware her urine culture is neg. Still having pain after urination. Will send message to mad.

## 2015-08-21 NOTE — Telephone Encounter (Signed)
Pt called and stated that she was here last week and dr de did a urine sample on her and she has not heard about the results, she was wanting a call back with results

## 2015-08-27 NOTE — Patient Instructions (Signed)
1.  Resume activities without restriction. 2.  Urinalysis and urine culture is obtained. 3.  Return in 3 months for follow-up

## 2015-08-27 NOTE — Progress Notes (Signed)
Chief complaint: 1.  Final postop check.  Patient presents for final postoperative check, status post TAH, bilateral salpingectomy, postoperative course complicated by hematoma evacuation, likely due to anticoagulants perioperatively. Patient is doing well with normal bowel function.  She does have Voiding discomfortAt end of micturition. She also has some numbness on her incision site.  OBJECTIVE: BP 102/66 mmHg  Pulse 91  Ht 5\' 6"  (1.676 m)  Wt 205 lb (92.987 kg)  BMI 33.10 kg/m2  LMP 06/26/2015 (Exact Date) Pleasant female in no acute distress. Abdomen: Soft, nontender, without organomegaly; NaCl incision is well healed without evidence of hernia. Pelvic:  External genitalia normal BUS-normal. Vagina-good vault support; vaginal cuff intact. Bimanual-No palpable masses. RB-normal.  External exam  ASSESSMENT: 1. Normal postop check 2.  Voiding discomfort, cannot rule out UTI.  PLAN: 1.  Resume all activities without restriction. 2.  UA with C&S. 3.  Return in 3 months for follow-up  Brayton Mars, MD  Note: This dictation was prepared with Dragon dictation along with smaller phrase technology. Any transcriptional errors that result from this process are unintentional.

## 2015-08-28 NOTE — Telephone Encounter (Signed)
Pt is aware to increase h20 and cranberry juice. If persists she will contact office for bua referral. Pt states it seems a little better.

## 2015-11-13 ENCOUNTER — Ambulatory Visit: Payer: BLUE CROSS/BLUE SHIELD | Admitting: Obstetrics and Gynecology

## 2015-11-22 ENCOUNTER — Ambulatory Visit (INDEPENDENT_AMBULATORY_CARE_PROVIDER_SITE_OTHER): Payer: BLUE CROSS/BLUE SHIELD | Admitting: Obstetrics and Gynecology

## 2015-11-22 ENCOUNTER — Encounter: Payer: Self-pay | Admitting: Obstetrics and Gynecology

## 2015-11-22 VITALS — BP 108/69 | HR 90 | Ht 66.0 in | Wt 212.3 lb

## 2015-11-22 DIAGNOSIS — O3430 Maternal care for cervical incompetence, unspecified trimester: Secondary | ICD-10-CM

## 2015-11-22 DIAGNOSIS — N941 Unspecified dyspareunia: Secondary | ICD-10-CM | POA: Diagnosis not present

## 2015-11-22 DIAGNOSIS — R300 Dysuria: Secondary | ICD-10-CM | POA: Diagnosis not present

## 2015-11-22 MED ORDER — ESTROGENS, CONJUGATED 0.625 MG/GM VA CREA: 0.5000 | TOPICAL_CREAM | Freq: Every day | VAGINAL | Status: DC

## 2015-11-22 NOTE — Addendum Note (Signed)
Addended by: Elouise Munroe on: 11/22/2015 04:45 PM   Modules accepted: Orders

## 2015-11-22 NOTE — Addendum Note (Signed)
Addended by: Elouise Munroe on: 11/22/2015 03:33 PM   Modules accepted: Orders

## 2015-11-22 NOTE — Patient Instructions (Addendum)
1. Insert Premarin cream 1/2 g intravaginal nightly for 30 days 2. Return in 6 weeks for follow-up

## 2015-11-22 NOTE — Progress Notes (Signed)
Chief complaint: 1. Dyspareunia 2. Postcoital bleeding 3. Voiding dysfunction  The patient presents today for follow-up 3 months after total abdominal hysterectomy bilateral salpingectomy for management of chronic pelvic pain. Her procedure was complicated by a wound hematoma secondary to anticoagulant use. Patient had complex past surgical history including McDonald cerclage 4, Shirodkar cerclage, hysterotomy for IUFD, cesarean section delivery, and tubal ligation.  Since surgery patient has noted occasional spotting and discomfort with deep thrusting intercourse. She also is experiencing discomfort with terminal stage of voiding. It is described as a sharp stabbing type sensation.  Past medical history, past surgical history, problem list, medications, and allergies are reviewed.  OBJECTIVE: BP 108/69 mmHg  Pulse 90  Ht 5\' 6"  (1.676 m)  Wt 212 lb 4.8 oz (96.299 kg)  BMI 34.28 kg/m2  LMP 06/26/2015 (Exact Date) Pleasant female in no acute distress Abdomen: Soft, nontender, without organomegaly Pelvic exam: External genitalia-normal BUS-normal Vagina-normal estrogen effect; good vault support; no evidence of granulation tissue; 2 skin tags each measuring approximately 3 mm are identified and nonfriable Cervix-surgically absent Uterus-surgically absent Bimanual-palpation of the anterior vagina just to the right of midline is notable for a linear from suture like density in the submucosa which is tender (point); no significant extensive induration surrounding this area. Adnexa-no adnexal masses Rectovaginal-normal external exam  ASSESSMENT: 1. 3 months status post TAH bilateral salpingectomy for chronic pelvic pain 2. Postoperative ongoing dyspareunia 3. Occasional postcoital spotting without obvious source of bleeding on exam 4. Palpable suture like irregularity just to the right of midline in the proximal vagina which is point tender, uncertain significance cannot determine if  this would be residual suture from surgery which has not resolved orifices part of the Shirodkar suture that was placed using ago. This focus of abnormality at the base of the bladder may also be contributing to her voiding dysfunction symptoms.  PLAN: 1. Trial of topical vaginal estrogen and 1/2 g intravaginal nightly for 4 weeks 2. Urinalysis-normal 3. Return in 6 weeks for follow-up 4. If symptoms persist, consider neurology referral and/or MRI to assess for possible foreign body presence in the vagina and perivesicular region which could be contributing to symptoms  A total of 15 minutes were spent face-to-face with the patient during this encounter and over half of that time dealt with counseling and coordination of care.   Brayton Mars, MD  Note: This dictation was prepared with Dragon dictation along with smaller phrase technology. Any transcriptional errors that result from this process are unintentional.

## 2015-12-05 ENCOUNTER — Encounter: Payer: Self-pay | Admitting: Emergency Medicine

## 2015-12-05 ENCOUNTER — Emergency Department: Payer: 59

## 2015-12-05 ENCOUNTER — Ambulatory Visit (INDEPENDENT_AMBULATORY_CARE_PROVIDER_SITE_OTHER): Payer: BLUE CROSS/BLUE SHIELD

## 2015-12-05 ENCOUNTER — Ambulatory Visit
Admission: EM | Admit: 2015-12-05 | Discharge: 2015-12-05 | Disposition: A | Discharge status: Home / Self Care | Payer: BLUE CROSS/BLUE SHIELD | Attending: Family Medicine | Admitting: Family Medicine

## 2015-12-05 ENCOUNTER — Emergency Department
Admission: EM | Admit: 2015-12-05 | Discharge: 2015-12-06 | Disposition: A | Discharge status: Home / Self Care | Payer: 59 | Attending: Emergency Medicine | Admitting: Emergency Medicine

## 2015-12-05 ENCOUNTER — Encounter: Payer: Self-pay | Admitting: *Deleted

## 2015-12-05 DIAGNOSIS — R609 Edema, unspecified: Secondary | ICD-10-CM

## 2015-12-05 DIAGNOSIS — R079 Chest pain, unspecified: Secondary | ICD-10-CM

## 2015-12-05 DIAGNOSIS — Z79899 Other long term (current) drug therapy: Secondary | ICD-10-CM | POA: Insufficient documentation

## 2015-12-05 DIAGNOSIS — R0602 Shortness of breath: Secondary | ICD-10-CM | POA: Insufficient documentation

## 2015-12-05 DIAGNOSIS — Z8759 Personal history of other complications of pregnancy, childbirth and the puerperium: Secondary | ICD-10-CM

## 2015-12-05 DIAGNOSIS — Z8679 Personal history of other diseases of the circulatory system: Secondary | ICD-10-CM | POA: Diagnosis not present

## 2015-12-05 DIAGNOSIS — E785 Hyperlipidemia, unspecified: Secondary | ICD-10-CM | POA: Insufficient documentation

## 2015-12-05 DIAGNOSIS — R0789 Other chest pain: Secondary | ICD-10-CM | POA: Insufficient documentation

## 2015-12-05 DIAGNOSIS — Z86711 Personal history of pulmonary embolism: Secondary | ICD-10-CM

## 2015-12-05 DIAGNOSIS — R2243 Localized swelling, mass and lump, lower limb, bilateral: Secondary | ICD-10-CM | POA: Diagnosis present

## 2015-12-05 DIAGNOSIS — R600 Localized edema: Secondary | ICD-10-CM | POA: Insufficient documentation

## 2015-12-05 DIAGNOSIS — R899 Unspecified abnormal finding in specimens from other organs, systems and tissues: Secondary | ICD-10-CM | POA: Diagnosis not present

## 2015-12-05 LAB — CBC WITH DIFFERENTIAL/PLATELET
Basophils Absolute: 0.1 10*3/uL (ref 0–0.1)
Basophils Relative: 1 %
EOS PCT: 3 %
Eosinophils Absolute: 0.2 10*3/uL (ref 0–0.7)
HEMATOCRIT: 40.1 % (ref 35.0–47.0)
HEMOGLOBIN: 13.2 g/dL (ref 12.0–16.0)
LYMPHS ABS: 2.6 10*3/uL (ref 1.0–3.6)
Lymphocytes Relative: 39 %
MCH: 28.7 pg (ref 26.0–34.0)
MCHC: 33 g/dL (ref 32.0–36.0)
MCV: 86.8 fL (ref 80.0–100.0)
MONO ABS: 0.7 10*3/uL (ref 0.2–0.9)
MONOS PCT: 11 %
NEUTROS PCT: 46 %
Neutro Abs: 3.2 10*3/uL (ref 1.4–6.5)
Platelets: 204 10*3/uL (ref 150–440)
RBC: 4.62 MIL/uL (ref 3.80–5.20)
RDW: 14.8 % — ABNORMAL HIGH (ref 11.5–14.5)
WBC: 6.8 10*3/uL (ref 3.6–11.0)

## 2015-12-05 LAB — TROPONIN I: Troponin I: <0.03 ng/mL (ref <0.03)

## 2015-12-05 LAB — BASIC METABOLIC PANEL
ANION GAP: 5 (ref 5–15)
BUN: 13 mg/dL (ref 6–20)
CALCIUM: 9.6 mg/dL (ref 8.9–10.3)
CHLORIDE: 106 mmol/L (ref 101–111)
CO2: 26 mmol/L (ref 22–32)
Creatinine, Ser: 0.96 mg/dL (ref 0.44–1.00)
GFR calc non Af Amer: >60 mL/min (ref >60)
Glucose, Bld: 125 mg/dL — ABNORMAL HIGH (ref 65–99)
POTASSIUM: 4 mmol/L (ref 3.5–5.1)
Sodium: 137 mmol/L (ref 135–145)

## 2015-12-05 LAB — BRAIN NATRIURETIC PEPTIDE: B NATRIURETIC PEPTIDE 5: 11 pg/mL (ref 0.0–100.0)

## 2015-12-05 LAB — CKMB (ARMC ONLY): CK, MB: 1.9 ng/mL (ref 0.5–5.0)

## 2015-12-05 LAB — FIBRIN DERIVATIVES D-DIMER (ARMC ONLY): FIBRIN DERIVATIVES D-DIMER (ARMC): 852 — AB (ref 0–499)

## 2015-12-05 LAB — CK: Total CK: 230 U/L (ref 38–234)

## 2015-12-05 MED ORDER — IOPAMIDOL (ISOVUE-370) INJECTION 76%
100.0000 mL | Freq: Once | INTRAVENOUS | Status: AC | PRN
Start: 2015-12-05 — End: 2015-12-05
  Administered 2015-12-05: 100 mL via INTRAVENOUS
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
## 2015-12-05 NOTE — ED Provider Notes (Signed)
[facility] Emergency Department Provider Note   ____________________________________________  Time seen: Approximately 10:25 PM  I have reviewed the triage vital signs and the nursing notes.   HISTORY  Chief Complaint Abnormal Lab     Riddhi Jurney is a 45 y.o. female patient sent from urgent care with history of leg swelling for about one month intermittently before that she also has intermittent shortness of breath and tachycardia. she has a family history of congestive failure. Her d-dimer was high and she was sent here for evaluation of the d-dimer and the above-stated describe symptoms. Note from urgent care reviewed thoroughly. Swelling in the legs is mild. It is not really tender on exam.    Past Medical History  Diagnosis Date  . Tachycardia   . Hyperlipemia   . GERD (gastroesophageal reflux disease)   . Fibroid   . Headache     H/O  . Anemia     H/O   . Shortness of breath dyspnea     ASSOCIATED WITH TACHYCARDIA  . Anxiety   . Dysrhythmia     SVT-HAD CARDIAC ABLATION DONE IN 2016 BUT PT STATES SHE STILL GETS INTERMITTENT TACHYCARDIC AND IS SYPMPTOMATIC WITH SOB, DIZZINESS DURING TACHY EPISODES (06-25-15)    Patient Active Problem List   Diagnosis Date Noted  . Status post abdominal hysterectomy 07/02/2015  . History of pulmonary embolus during pregnancy 05/16/2015  . Tachycardia 05/10/2015  . History of cesarean section 05/10/2015  . Shirodkar cerclage present 05/10/2015  . History of fundoplication 123456  . Gastro-esophageal reflux disease without esophagitis 03/28/2014  . Combined fat and carbohydrate induced hyperlipemia 03/28/2014    Past Surgical History  Procedure Laterality Date  . Tubal ligation    . Cesarean section    . Appendectomy    . Cholecystectomy    . Cervical cerclage      McDonald cerclage 4; Shirodkar cerclage 1  . Nissen fundoplication    . Fibroids removed    . Novasure ablation    . Dilation  and curettage of uterus    . Cardiac electrophysiology study and ablation    . Abdominal hysterectomy Bilateral 07/02/2015    Procedure: HYSTERECTOMY ABDOMINAL WITH BS;  Surgeon: Brayton Mars, MD;  Location: [facility] ORS;  Service: Gynecology;  Laterality: Bilateral;  . Cystoscopy Bilateral 07/02/2015    Procedure: CYSTOSCOPY;  Surgeon: Brayton Mars, MD;  Location: [facility] ORS;  Service: Gynecology;  Laterality: Bilateral;  . Laparotomy N/A 07/11/2015    Procedure: hematoma evacuation;  Surgeon: Brayton Mars, MD;  Location: [facility] ORS;  Service: Gynecology;  Laterality: N/A;    Current Outpatient Rx  Name  Route  Sig  Dispense  Refill  . atorvastatin (LIPITOR) 20 MG tablet      TK 1 T PO Q NIGHT         . Biotin 1 MG CAPS   Oral   Take by mouth.         . conjugated estrogens (PREMARIN) vaginal cream   Vaginal   Place 0.5 Applicatorfuls vaginally at bedtime. 1/2 gram nightly for 30 days.   42.5 g   3   . Multiple Vitamins-Minerals (MULTIVITAMIN ADULT PO)   Oral   Take by mouth.         . propranolol (INDERAL) 20 MG tablet      TK 1 T PO PRN         . propranolol ER (INDERAL LA) 80 MG 24 hr capsule  TK ONE C PO BID           Allergies Codeine; Hydrocodone-acetaminophen; Adhesive; Aspirin; and Diphenhydramine hcl  Family History  Problem Relation Age of Onset  . Diabetes Mother   . Prostate cancer Father   . Congestive Heart Failure Father   . Cancer Neg Hx     Social History Social History  Substance Use Topics  . Smoking status: Never Smoker   . Smokeless tobacco: None  . Alcohol Use: No    Review of Systems Constitutional: No fever/chills Eyes: No visual changes. ENT: No sore throat. Cardiovascular: Patient reports occasional chest pain with exertion Respiratory: Occasional shortness of breath. Gastrointestinal: No abdominal pain.  No nausea, no vomiting.  No diarrhea.  No constipation. Genitourinary: Negative for  dysuria. Musculoskeletal: Negative for back pain. Skin: Negative for rash. Neurological: Negative for headaches, focal weakness or numbness.  10-point ROS otherwise negative.  ____________________________________________   PHYSICAL EXAM:  VITAL SIGNS: ED Triage Vitals  Enc Vitals Group     BP 12/05/15 2209 120/84 mmHg     Pulse Rate 12/05/15 2209 77     Resp 12/05/15 2209 18     Temp 12/05/15 2209 98.2 F (36.8 C)     Temp Source 12/05/15 2209 Oral     SpO2 12/05/15 2209 100 %     Weight 12/05/15 2209 190 lb (86.183 kg)     Height 12/05/15 2209 5\' 6"  (1.676 m)     Head Cir --      Peak Flow --      Pain Score 12/05/15 2210 0     Pain Loc --      Pain Edu? --      Excl. in [place]? --     Constitutional: Alert and oriented. Well appearing and in no acute distress. Eyes: Conjunctivae are normal. PERRL. EOMI. Head: Atraumatic. Nose: No congestion/rhinnorhea. Mouth/Throat: Mucous membranes are moist.  Oropharynx non-erythematous. Neck: No stridor. Cardiovascular: Normal rate, regular rhythm. Grossly normal heart sounds.  Good peripheral circulation. Respiratory: Normal respiratory effort.  No retractions. Lungs CTAB. Gastrointestinal: Soft and nontender. No distention. No abdominal bruits. No CVA tenderness. Musculoskeletal: No lower extremity tenderness And mild edema left and 1+ No joint effusions. Neurologic:  Normal speech and language. No gross focal neurologic deficits are appreciated. No gait instability. Skin:  Skin is warm, dry and intact. No rash noted. Psychiatric: Mood and affect are normal. Speech and behavior are normal.  ____________________________________________   LABS (all labs ordered are listed, but only abnormal results are displayed)  Labs Reviewed  BRAIN NATRIURETIC PEPTIDE   ____________________________________________  EKG  EKG read and interpreted by me shows normal sinus rhythm rate of 71 normal axis no acute  changes. ____________________________________________  RADIOLOGY  X-ray done at the urgent care showed no acute disease per radiology ____________________________________________   PROCEDURES    Procedures    ____________________________________________   INITIAL IMPRESSION / ASSESSMENT AND PLAN / ED COURSE  Pertinent labs & imaging results that were available during my care of the patient were reviewed by me and considered in my medical decision making (see chart for details).  We are waiting for the BNP and CT angiogram to return and will sign the patient out to Dr. for about ____________________________________________   FINAL CLINICAL IMPRESSION(S) / ED DIAGNOSES  Final diagnoses:  None      NEW MEDICATIONS STARTED DURING THIS VISIT:  New Prescriptions   No medications on file     Note:  This document was prepared using Dragon voice recognition software and may include unintentional dictation errors.    Nena Polio, MD 12/05/15 [phone]

## 2015-12-05 NOTE — ED Provider Notes (Signed)
-----------------------------------------   11:28 PM on 12/05/2015 -----------------------------------------   Blood pressure 120/84, pulse 77, temperature 98.2 F (36.8 C), temperature source Oral, resp. rate 18, height 5\' 6"  (1.676 m), weight 86.183 kg, last menstrual period 06/26/2015, SpO2 100 %.  Assuming care from Dr. Cinda Quest.  In short, Brittany Baldwin is a 45 y.o. female with a chief complaint of Abnormal Lab .  Refer to the original H&P for additional details.  The current plan of care is to follow-up a troponin and likely discharge home for outpatient follow-up.   ----------------------------------------- 1:32 AM on 12/06/2015 -----------------------------------------  The patient's second troponin was negative and her CT angiography chest was negative for any acute abnormalities.  This appears to be more of an outpatient follow-up issue.  Discussed with patient and family and answered multiple questions.  Discharged per Dr. Sonny Dandy paperwork.  Hinda Kehr, MD 12/06/15 [phone]

## 2015-12-05 NOTE — Discharge Instructions (Signed)
Cardiac-Specific Troponin I and T Test WHY AM I HAVING THIS TEST? You may have this test if you have experienced chest pain. The test can be used to determine if you have had a heart attack or injury to heart (cardiac) muscle. This test can also help predict the possibility of future heart attacks. This test measures the concentration of cardiac-specific troponin in your blood. Troponins are proteins that help muscles contract. There are three forms of troponin, including troponins C, I, and T. The types of troponins I and T that are found in cardiac muscle are different from the troponins I and T that are found in skeletal muscle. Therefore, testing can be done for cardiac-specific troponins I and T. These types of troponin are normally present in very small quantities in the blood. When there is damage to heart muscle cells, cardiac troponins I and T are released into circulation. The more damage there is, the greater the concentration of troponins I and T. When a person has a heart attack, levels of troponin can become elevated in the blood within 3-4 hours after injury and may remain elevated for 10-14 days. WHAT KIND OF SAMPLE IS TAKEN? A blood sample is required for this test. It is usually collected by inserting a needle into a vein. Usually, an initial blood sample is collected, and then another blood sample is collected 12 hours later. After these samples, you will have your blood tested daily for 3-5 days. You might also have it tested weekly for 5-6 weeks. HOW DO I PREPARE FOR THE TEST? There is no preparation required for this test. However, be aware that you will need to make arrangements to have your blood collected frequently.  WHAT ARE THE REFERENCE RANGES? Reference values are considered healthy values established after testing a large group of healthy people. Reference values may vary among different people, labs, and hospitals. It is your responsibility to obtain your test results. Ask  the lab or department performing the test when and how you will get your results. Reference values for cardiac troponins are as follows:  Cardiac troponin T: less than 0.1 ng/mL.  Cardiac troponin I: less than 0.03 ng/mL. WHAT DO THE RESULTS MEAN? Troponin values above the reference values may indicate:  Injury to the heart muscle.  Heart attack. Talk with your health care provider to discuss your results, treatment options, and if necessary, the need for more tests. Talk with your health care provider if you have any questions about your results.   This information is not intended to replace advice given to you by your health care provider. Make sure you discuss any questions you have with your health care provider.   Document Released: 06/21/2004 Document Revised: 06/09/2014 Document Reviewed: 10/12/2013 Elsevier Interactive Patient Education 2016 [employer].  Chest Pain Observation It is often hard to give a specific diagnosis for the cause of chest pain. Among other possibilities your symptoms might be caused by inadequate oxygen delivery to your heart (angina). Angina that is not treated or evaluated can lead to a heart attack (myocardial infarction) or death. Blood tests, electrocardiograms, and X-rays may have been done to help determine a possible cause of your chest pain. After evaluation and observation, your health care provider has determined that it is unlikely your pain was caused by an unstable condition that requires hospitalization. However, a full evaluation of your pain may need to be completed, with additional diagnostic testing as directed. It is very important to keep your follow-up  appointments. Not keeping your follow-up appointments could result in permanent heart damage, disability, or death. If there is any problem keeping your follow-up appointments, you must call your health care provider. HOME CARE INSTRUCTIONS  Due to the slight chance that your pain could be  angina, it is important to follow your health care provider's treatment plan and also maintain a healthy lifestyle:  Maintain or work toward achieving a healthy weight.  Stay physically active and exercise regularly.  Decrease your salt intake.  Eat a balanced, healthy diet. Talk to a dietitian to learn about heart-healthy foods.  Increase your fiber intake by including whole grains, vegetables, fruits, and nuts in your diet.  Avoid situations that cause stress, anger, or depression.  Take medicines as advised by your health care provider. Report any side effects to your health care provider. Do not stop medicines or adjust the dosages on your own.  Quit smoking. Do not use nicotine patches or gum until you check with your health care provider.  Keep your blood pressure, blood sugar, and cholesterol levels within normal limits.  Limit alcohol intake to no more than 1 drink per day for women who are not pregnant and 2 drinks per day for men.  Do not abuse drugs. SEEK IMMEDIATE MEDICAL CARE IF: You have severe chest pain or pressure which may include symptoms such as:  You feel pain or pressure in your arms, neck, jaw, or back.  You have severe back or abdominal pain, feel sick to your stomach (nauseous), or throw up (vomit).  You are sweating profusely.  You are having a fast or irregular heartbeat.  You feel short of breath while at rest.  You notice increasing shortness of breath during rest, sleep, or with activity.  You have chest pain that does not get better after rest or after taking your usual medicine.  You wake from sleep with chest pain.  You are unable to sleep because you cannot breathe.  You develop a frequent cough or you are coughing up blood.  You feel dizzy, faint, or experience extreme fatigue.  You develop severe weakness, dizziness, fainting, or chills. Any of these symptoms may represent a serious problem that is an emergency. Do not wait to see  if the symptoms will go away. Call your local emergency services (911 in the [place] Do not drive yourself to the hospital. MAKE SURE YOU:  Understand these instructions.  Will watch your condition.  Will get help right away if you are not doing well or get worse.   This information is not intended to replace advice given to you by your health care provider. Make sure you discuss any questions you have with your health care provider.   Document Released: 06/21/2010 Document Revised: 05/24/2013 Document Reviewed: 11/18/2012 Elsevier Interactive Patient Education [employer].

## 2015-12-05 NOTE — ED Notes (Signed)
Patient started having ankle swelling one month ago. No other symptoms reported.

## 2015-12-06 ENCOUNTER — Telehealth: Payer: Self-pay | Admitting: *Deleted

## 2015-12-06 ENCOUNTER — Telehealth: Payer: Self-pay | Admitting: Cardiovascular Disease

## 2015-12-06 LAB — TROPONIN I

## 2015-12-06 LAB — TSH: TSH: 3.866 u[IU]/mL (ref 0.350–4.500)

## 2015-12-06 NOTE — ED Notes (Signed)
Called patient no answer. Left message on answering machine to call back.

## 2015-12-06 NOTE — Telephone Encounter (Signed)
lmov for patient to call back and schedule ED fu from 12/05/15

## 2016-01-08 ENCOUNTER — Ambulatory Visit: Payer: BLUE CROSS/BLUE SHIELD | Admitting: Obstetrics and Gynecology

## 2016-01-15 ENCOUNTER — Ambulatory Visit: Payer: BLUE CROSS/BLUE SHIELD | Admitting: Obstetrics and Gynecology

## 2016-01-23 ENCOUNTER — Ambulatory Visit: Payer: BLUE CROSS/BLUE SHIELD | Admitting: Obstetrics and Gynecology

## 2016-02-12 ENCOUNTER — Ambulatory Visit: Payer: 59 | Admitting: Cardiology

## 2016-02-13 ENCOUNTER — Encounter: Payer: Self-pay | Admitting: Internal Medicine

## 2016-02-13 ENCOUNTER — Ambulatory Visit (INDEPENDENT_AMBULATORY_CARE_PROVIDER_SITE_OTHER): Payer: 59 | Admitting: Internal Medicine

## 2016-02-13 ENCOUNTER — Other Ambulatory Visit: Payer: Self-pay | Admitting: Internal Medicine

## 2016-02-13 ENCOUNTER — Ambulatory Visit (INDEPENDENT_AMBULATORY_CARE_PROVIDER_SITE_OTHER): Payer: 59 | Admitting: Cardiology

## 2016-02-13 VITALS — BP 102/78 | HR 77 | Resp 16 | Ht 66.0 in | Wt 209.0 lb

## 2016-02-13 VITALS — BP 108/66 | HR 74 | Ht 66.0 in | Wt 209.5 lb

## 2016-02-13 DIAGNOSIS — K219 Gastro-esophageal reflux disease without esophagitis: Secondary | ICD-10-CM

## 2016-02-13 DIAGNOSIS — E782 Mixed hyperlipidemia: Secondary | ICD-10-CM

## 2016-02-13 DIAGNOSIS — Z86711 Personal history of pulmonary embolism: Secondary | ICD-10-CM

## 2016-02-13 DIAGNOSIS — R000 Tachycardia, unspecified: Secondary | ICD-10-CM

## 2016-02-13 DIAGNOSIS — J382 Nodules of vocal cords: Secondary | ICD-10-CM | POA: Diagnosis not present

## 2016-02-13 DIAGNOSIS — Z8759 Personal history of other complications of pregnancy, childbirth and the puerperium: Secondary | ICD-10-CM

## 2016-02-13 NOTE — Progress Notes (Signed)
Date:  02/13/2016   Name:  Brittany Baldwin   DOB:  [birth year]   MRN:  [id number]   Chief Complaint: [place] Previously seen at [facility] for PCP and specialists but insurance has changed.  She is not sure if she can still be seen at [facility]  Gastroesophageal Reflux  She complains of abdominal pain (likely adhesions from multiple procedures), dysphagia, globus sensation and heartburn. She reports no chest pain, no coughing or no sore throat. This is a recurrent problem. The problem occurs occasionally. The symptoms are aggravated by lying down. Pertinent negatives include no fatigue. She has tried surgery for the symptoms. The treatment provided significant relief.  She no longer takes a PPI.  She has some epigastric discomfort in the AM but it is not persistent.  Tachycardia - s/p failed ablation.  Now on inderal daily and PRN breakthrough sx.  She established today with new cardiologist at [facility].  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  The nodules were small and ENT felt she would respond to speech therapy. She did one or 2 sessions but has not continued to follow-up. 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 She does not think that her voice has changed significantly.  Hyperlipidemia - she was placed on cholesterol medicine about 2 years ago for borderline elevated cholesterol. Review of risk factors there are none other than lipids. Her last cholesterol panel showed a total cholesterol 150 with an LDL of 93. She is uncertain whether she should continue medication.   Review of Systems  Constitutional: Negative for chills, fatigue and fever.  HENT: Positive for hearing loss, trouble swallowing and voice change. Negative for sore throat.   Eyes: Negative for visual disturbance.  Respiratory: Negative for cough, chest tightness and shortness of breath.   Cardiovascular: Negative for chest  pain, palpitations and leg swelling.  Gastrointestinal: Positive for abdominal pain (likely adhesions from multiple procedures), dysphagia and heartburn.  Genitourinary: Negative for dysuria.  Musculoskeletal: Negative for arthralgias, gait problem and joint swelling.  Skin: Negative for rash.  Neurological: Negative for dizziness and headaches.  Hematological: Negative for adenopathy.  Psychiatric/Behavioral: Negative for dysphoric mood and sleep disturbance.    Patient Active Problem List   Diagnosis Date Noted  . 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 Status post abdominal hysterectomy 07/02/2015  . History of pulmonary embolus during pregnancy 05/16/2015  . Tachycardia 05/10/2015  . Shirodkar cerclage present 05/10/2015  . History of fundoplication 123456  . Gastro-esophageal reflux disease without esophagitis 03/28/2014  . Combined fat and carbohydrate induced hyperlipemia 03/28/2014    Prior to Admission medications   Medication Sig Start Date End Date Taking? Authorizing Provider  atorvastatin (LIPITOR) 20 MG tablet TK 1 T PO Q NIGHT 01/16/15  Yes Historical Provider, MD  Biotin 1 MG CAPS Take by mouth.   Yes Historical Provider, MD  propranolol (INDERAL) 20 MG tablet TK 1 T PO PRN 11/14/14  Yes Historical Provider, MD  propranolol ER (INDERAL LA) 80 MG 24 hr capsule TK ONE C PO BID 01/14/15  Yes Historical Provider, MD    Allergies  Allergen Reactions  . Codeine Shortness Of Breath  . Hydrocodone-Acetaminophen Rash  . Aspirin Other (See Comments)    Stomach issues  . Diphenhydramine Hcl Other (See Comments)    Through an I.V. Drip in addition to other meds  .  Adhesive [Tape] Rash    ELECTRODES FROM HOLTER MONITOR CAUSED RASH    Past Surgical History:  Procedure Laterality Date  . ABDOMINAL HYSTERECTOMY Bilateral 07/02/2015   Procedure: HYSTERECTOMY ABDOMINAL WITH BS;  Surgeon: Brayton Mars, MD;  Location: [facility] ORS;  Service: Gynecology;  Laterality: Bilateral;    . APPENDECTOMY    . CARDIAC ELECTROPHYSIOLOGY STUDY AND ABLATION    . CERVICAL CERCLAGE     McDonald cerclage 4; Shirodkar cerclage 1  . CESAREAN SECTION    . CHOLECYSTECTOMY    . CYSTOSCOPY Bilateral 07/02/2015   Procedure: CYSTOSCOPY;  Surgeon: Brayton Mars, MD;  Location: [facility] ORS;  Service: Gynecology;  Laterality: Bilateral;  . DILATION AND CURETTAGE OF UTERUS    . LAPAROTOMY N/A 07/11/2015   Procedure: hematoma evacuation;  Surgeon: Brayton Mars, MD;  Location: [facility] ORS;  Service: Gynecology;  Laterality: N/A;  . NISSEN FUNDOPLICATION    . NOVASURE ABLATION    . TUBAL LIGATION      Social History  Substance Use Topics  . Smoking status: Never Smoker  . Smokeless tobacco: Never Used  . Alcohol use No     Medication list has been reviewed and updated.   Physical Exam  Constitutional: She is oriented to person, place, and time. She appears well-developed. No distress.  HENT:  Head: Normocephalic and atraumatic.  Neck: Carotid bruit is not present. No thyroid mass and no thyromegaly present.  Cardiovascular: Normal rate, regular rhythm and normal heart sounds.   Pulmonary/Chest: Effort normal and breath sounds normal. No respiratory distress. She has no wheezes.  Abdominal: Soft. Bowel sounds are normal. There is no hepatosplenomegaly. There is tenderness in the epigastric area. There is no rebound and no guarding.  Musculoskeletal: Normal range of motion.  Lymphadenopathy:    She has no cervical adenopathy.  Neurological: She is alert and oriented to person, place, and time. She has normal reflexes.  Skin: Skin is warm, dry and intact. No rash noted.  Psychiatric: She has a normal mood and affect. Her speech is normal and behavior is normal. Thought content normal.  Nursing note and vitals reviewed.   BP 102/78 (BP Location: Right Arm, Patient Position: Sitting, Cuff Size: Large)   Pulse 77   Resp 16   Ht 5\' 6"  (1.676 m)   Wt 209 lb (94.8 kg)   LMP  06/26/2015 (Exact Date)   SpO2 98%   BMI 33.73 kg/m   Assessment and Plan: 1. Vocal cord nodules Patient to check if she can return to [facility] ENT for follow up  2. Gastro-esophageal reflux disease without esophagitis Monitor for progression of mild sx  3. Tachycardia Established with Cardiology Continue Inderal  4. Combined fat and carbohydrate induced hyperlipemia Can reduce dose of atorvastatin to 10 mg per day Check labs at [facility] in approx 4 months   Halina Maidens, MD [facility]  02/13/2016

## 2016-02-13 NOTE — Patient Instructions (Addendum)
Testing/Procedures: Your physician has recommended that you wear a holter monitor. Holter monitors are medical devices that record the heart's electrical activity. Doctors most often use these monitors to diagnose arrhythmias. Arrhythmias are problems with the speed or rhythm of the heartbeat. The monitor is a small, portable device. You can wear one while you do your normal daily activities. This is usually used to diagnose what is causing palpitations/syncope (passing out).  Your physician has requested that you have an echocardiogram. Echocardiography is a painless test that uses sound waves to create images of your heart. It provides your doctor with information about the size and shape of your heart and how well your heart's chambers and valves are working. This procedure takes approximately one hour. There are no restrictions for this procedure.    Follow-Up: Your physician recommends that you schedule a follow-up appointment after testing with Dr. Yvone Neu.  It was a pleasure seeing you today here in the office. Please do not hesitate to give [place] a call back if you have any further questions. [place], BSN      Holter Monitoring A Holter monitor is a small device that is used to detect abnormal heart rhythms. It clips to your clothing and is connected by wires to flat, sticky disks (electrodes) that attach to your chest. It is worn continuously for 24-48 hours. HOME CARE INSTRUCTIONS  Wear your Holter monitor at all times, even while exercising and sleeping, for as long as directed by your health care provider.  Make sure that the Holter monitor is safely clipped to your clothing or close to your body as recommended by your health care provider.  Do not get the monitor or wires wet.  Do not put body lotion or moisturizer on your chest.  Keep your skin clean.  Keep a diary of your daily activities, such as walking and doing chores. If you feel that your heartbeat  is abnormal or that your heart is fluttering or skipping a beat:  Record what you are doing when it happens.  Record what time of day the symptoms occur.  Return your Holter monitor as directed by your health care provider.  Keep all follow-up visits as directed by your health care provider. This is important. SEEK IMMEDIATE MEDICAL CARE IF:  You feel lightheaded or you faint.  You have trouble breathing.  You feel pain in your chest, upper arm, or jaw.  You feel sick to your stomach and your skin is pale, cool, or damp.  You heartbeat feels unusual or abnormal.   This information is not intended to replace advice given to you by your health care provider. Make sure you discuss any questions you have with your health care provider.   Document Released: 02/15/2004 Document Revised: 06/09/2014 Document Reviewed: 12/26/2013 Elsevier Interactive Patient Education 2016 [employer].     Echocardiogram An echocardiogram, or echocardiography, uses sound waves (ultrasound) to produce an image of your heart. The echocardiogram is simple, painless, obtained within a short period of time, and offers valuable information to your health care provider. The images from an echocardiogram can provide information such as:  Evidence of coronary artery disease (CAD).  Heart size.  Heart muscle function.  Heart valve function.  Aneurysm detection.  Evidence of a past heart attack.  Fluid buildup around the heart.  Heart muscle thickening.  Assess heart valve function. LET [facility] CARE PROVIDER KNOW ABOUT:  Any allergies you have.  All medicines you are taking,  including vitamins, herbs, eye drops, creams, and over-the-counter medicines.  Previous problems you or members of your family have had with the use of anesthetics.  Any blood disorders you have.  Previous surgeries you have had.  Medical conditions you have.  Possibility of pregnancy, if this applies. BEFORE THE  PROCEDURE  No special preparation is needed. Eat and drink normally.  PROCEDURE   In order to produce an image of your heart, gel will be applied to your chest and a wand-like tool (transducer) will be moved over your chest. The gel will help transmit the sound waves from the transducer. The sound waves will harmlessly bounce off your heart to allow the heart images to be captured in real-time motion. These images will then be recorded.  You may need an IV to receive a medicine that improves the quality of the pictures. AFTER THE PROCEDURE You may return to your normal schedule including diet, activities, and medicines, unless your health care provider tells you otherwise.   This information is not intended to replace advice given to you by your health care provider. Make sure you discuss any questions you have with your health care provider.   Document Released: 05/16/2000 Document Revised: 06/09/2014 Document Reviewed: 01/24/2013 Elsevier Interactive Patient Education [employer].

## 2016-02-13 NOTE — Progress Notes (Signed)
Cardiology Office Note   Date:  02/13/2016   ID:  Brittany Baldwin, DOB 1970/09/20, MRN [id number]  Referring Doctor:  Halina Maidens, MD   Cardiologist:   Wende Bushy, MD   Reason for consultation:  Chief Complaint  Patient presents with  . other    F/u ED due to edema and tachycardia. Meds reveiwed verbally with pt.      History of Present Illness: Brittany Baldwin is a 45 y.o. female who presents forEstablishing cardiology care.  Patient was previously followed at [facility] electrophysiology for SVT. 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 Despite ablation, patient continued to be symptomatic with palpitations. Diagnosed to have inappropriate sinus tachycardia. She has tried different medications including calcium channel blocker, bisoprolol, others unrecalled. Noted best response to propranolol. Were recently in the last several months or so, she is noted worsening of the palpitations. More frequent in episodes, intensity about the same as before, symptoms mainly in the chest, feeling heart racing, lasting a few seconds at a time, randomly occurring.  Had issues with reflux/GERD in the past. Underwent fundoplication. She had been on PPI previously but not in the last year or so.  She underwent stress echocardiogram in June 2016. It was negative for ischemia.  No episodes of loss of consciousness. No chest pain, no shortness of breath.  ROS:  Please see the history of present illness. Aside from mentioned under HPI, all other systems are reviewed and negative.     Past Medical History:  Diagnosis Date  . Anemia    H/O   . Anxiety   . Dysrhythmia    SVT-HAD CARDIAC ABLATION DONE IN 2016 BUT PT STATES SHE STILL GETS INTERMITTENT TACHYCARDIC AND IS SYPMPTOMATIC WITH SOB, DIZZINESS DURING TACHY EPISODES (06-25-15)  . Fibroid   . GERD (gastroesophageal reflux disease)   . Headache    H/O  . Heart murmur    as child  . Hyperlipemia   .  Shortness of breath dyspnea    ASSOCIATED WITH TACHYCARDIA  . Tachycardia     Past Surgical History:  Procedure Laterality Date  . ABDOMINAL HYSTERECTOMY Bilateral 07/02/2015   Procedure: HYSTERECTOMY ABDOMINAL WITH BS;  Surgeon: Brayton Mars, MD;  Location: [facility] ORS;  Service: Gynecology;  Laterality: Bilateral;  . APPENDECTOMY    . CARDIAC ELECTROPHYSIOLOGY STUDY AND ABLATION    . CERVICAL CERCLAGE     McDonald cerclage 4; Shirodkar cerclage 1  . CESAREAN SECTION    . CHOLECYSTECTOMY    . CYSTOSCOPY Bilateral 07/02/2015   Procedure: CYSTOSCOPY;  Surgeon: Brayton Mars, MD;  Location: [facility] ORS;  Service: Gynecology;  Laterality: Bilateral;  . DILATION AND CURETTAGE OF UTERUS    . LAPAROTOMY N/A 07/11/2015   Procedure: hematoma evacuation;  Surgeon: Brayton Mars, MD;  Location: [facility] ORS;  Service: Gynecology;  Laterality: N/A;  . NISSEN FUNDOPLICATION    . NOVASURE ABLATION    . TUBAL LIGATION       reports that she has never smoked. She has never used smokeless tobacco. She reports that she does not drink alcohol or use drugs.   family history includes Congestive Heart Failure in her father; Diabetes in her mother; Heart failure in her father; Prostate cancer in her father.   Outpatient Medications Prior to Visit  Medication Sig Dispense Refill  . atorvastatin (LIPITOR) 20 MG tablet TK 1 T PO Q NIGHT    . Biotin 1 MG CAPS Take by mouth.    Marland Kitchen  propranolol (INDERAL) 20 MG tablet TK 1 T PO PRN    . propranolol ER (INDERAL LA) 80 MG 24 hr capsule TK ONE C PO BID    . citalopram (CELEXA) 10 MG tablet Take by mouth.    . conjugated estrogens (PREMARIN) vaginal cream Place 0.5 Applicatorfuls vaginally at bedtime. 1/2 gram nightly for 30 days. (Patient not taking: Reported on 02/13/2016) 42.5 g 3  . Multiple Vitamins-Minerals (MULTIVITAMIN ADULT PO) Take by mouth.    . pantoprazole (PROTONIX) 40 MG tablet TK 1 T PO QD     No facility-administered medications prior  to visit.      Allergies: Codeine; Hydrocodone-acetaminophen; Aspirin; Diphenhydramine hcl; and Adhesive [tape]    PHYSICAL EXAM: VS:  BP 108/66 (BP Location: Right Arm, Patient Position: Sitting, Cuff Size: Normal)   Pulse 74   Ht 5\' 6"  (1.676 m)   Wt 209 lb 8 oz (95 kg)   LMP 06/26/2015 (Exact Date)   BMI 33.81 kg/m  , Body mass index is 33.81 kg/m. Wt Readings from Last 3 Encounters:  02/13/16 209 lb 8 oz (95 kg)  12/05/15 190 lb (86.2 kg)  12/05/15 210 lb (95.3 kg)    GENERAL:  well developed, well nourished, obese, not in acute distress HEENT: normocephalic, pink conjunctivae, anicteric sclerae, no xanthelasma, normal dentition, oropharynx clear NECK:  no neck vein engorgement, JVP normal, no hepatojugular reflux, carotid upstroke brisk and symmetric, no bruit, no thyromegaly, no lymphadenopathy LUNGS:  good respiratory effort, clear to auscultation bilaterally CV:  PMI not displaced, no thrills, no lifts, S1 and S2 within normal limits, no palpable S3 or S4, no murmurs, no rubs, no gallops ABD:  Soft, nontender, nondistended, normoactive bowel sounds, no abdominal aortic bruit, no hepatomegaly, no splenomegaly MS: nontender back, no kyphosis, no scoliosis, no joint deformities EXT:  2+ DP/PT pulses, no edema, no varicosities, no cyanosis, no clubbing SKIN: warm, nondiaphoretic, normal turgor, no ulcers NEUROPSYCH: alert, oriented to person, place, and time, sensory/motor grossly intact, normal mood, appropriate affect  Recent Labs: 12/05/2015: B Natriuretic Peptide 11.0; BUN 13; Creatinine, Ser 0.96; Hemoglobin 13.2; Platelets 204; Potassium 4.0; Sodium 137; TSH 3.866   Lipid Panel No results found for: CHOL, TRIG, HDL, CHOLHDL, VLDL, LDLCALC, LDLDIRECT   Other studies Reviewed:  EKG:  The ekg from 02/13/2016 was personally reviewed by me and it revealed sinus rhythm, 74 BPM.  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 Recommend echocardiogram as well. Trial with PPI to see if her symptoms could possibly be related to GERD. As mentioned, she had not been on PPIs for the last year or so. Lengthy discussion with the patient regarding nature and pathophysiology of inappropriate sinus tachycardia if indeed that is her diagnosis. Emphasized that treatment of inappropriate sinus tachycardia may be difficult. Patient verbalized understanding   Current medicines are reviewed at length with the patient today.  The patient does not have concerns regarding medicines.  Labs/ tests ordered today include:  Orders Placed This Encounter  Procedures  . EKG 12-Lead    I had a lengthy and detailed discussion with the patient  regarding diagnoses, prognosis, diagnostic options, treatment options , and side effects of medications.   I counseled the patient on importance of lifestyle modification including heart healthy diet, regular physical activity  Disposition:   FU with undersigned after tests   I spent at least 60 minutes with the patient today and more than 50% of the time was spent counseling the patient and coordinating care.     Signed, Wende Bushy, MD  02/13/2016 1:35 PM    [place]  This note was generated in part with voice recognition software and I apologize for any typographical errors that were not detected and corrected.

## 2016-02-13 NOTE — Patient Instructions (Signed)
Breast Self-Awareness Practicing breast self-awareness may pick up problems early, prevent significant medical complications, and possibly save your life. By practicing breast self-awareness, you can become familiar with how your breasts look and feel and if your breasts are changing. This allows you to notice changes early. It can also offer you some reassurance that your breast health is good. One way to learn what is normal for your breasts and whether your breasts are changing is to do a breast self-exam. If you find a lump or something that was not present in the past, it is best to contact your caregiver right away. Other findings that should be evaluated by your caregiver include nipple discharge, especially if it is bloody; skin changes or reddening; areas where the skin seems to be pulled in (retracted); or new lumps and bumps. Breast pain is seldom associated with cancer (malignancy), but should also be evaluated by a caregiver. HOW TO PERFORM A BREAST SELF-EXAM The best time to examine your breasts is 5-7 days after your menstrual period is over. During menstruation, the breasts are lumpier, and it may be more difficult to pick up changes. If you do not menstruate, have reached menopause, or had your uterus removed (hysterectomy), you should examine your breasts at regular intervals, such as monthly. If you are breastfeeding, examine your breasts after a feeding or after using a breast pump. Breast implants do not decrease the risk for lumps or tumors, so continue to perform breast self-exams as recommended. Talk to your caregiver about how to determine the difference between the implant and breast tissue. Also, talk about the amount of pressure you should use during the exam. Over time, you will become more familiar with the variations of your breasts and more comfortable with the exam. A breast self-exam requires you to remove all your clothes above the waist. 1. Look at your breasts and nipples.  Stand in front of a mirror in a room with good lighting. With your hands on your hips, push your hands firmly downward. Look for a difference in shape, contour, and size from one breast to the other (asymmetry). Asymmetry includes puckers, dips, or bumps. Also, look for skin changes, such as reddened or scaly areas on the breasts. Look for nipple changes, such as discharge, dimpling, repositioning, or redness. 2. Carefully feel your breasts. This is best done either in the shower or tub while using soapy water or when flat on your back. Place the arm (on the side of the breast you are examining) above your head. Use the pads (not the fingertips) of your three middle fingers on your opposite hand to feel your breasts. Start in the underarm area and use  inch (2 cm) overlapping circles to feel your breast. Use 3 different levels of pressure (light, medium, and firm pressure) at each circle before moving to the next circle. The light pressure is needed to feel the tissue closest to the skin. The medium pressure will help to feel breast tissue a little deeper, while the firm pressure is needed to feel the tissue close to the ribs. Continue the overlapping circles, moving downward over the breast until you feel your ribs below your breast. Then, move one finger-width towards the center of the body. Continue to use the  inch (2 cm) overlapping circles to feel your breast as you move slowly up toward the collar bone (clavicle) near the base of the neck. Continue the up and down exam using all 3 pressures until you reach the   middle of the chest. Do this with each breast, carefully feeling for lumps or changes. 3.  Keep a written record with breast changes or normal findings for each breast. By writing this information down, you do not need to depend only on memory for size, tenderness, or location. Write down where you are in your menstrual cycle, if you are still menstruating. Breast tissue can have some lumps or  thick tissue. However, see your caregiver if you find anything that concerns you.  SEEK MEDICAL CARE IF:  You see a change in shape, contour, or size of your breasts or nipples.   You see skin changes, such as reddened or scaly areas on the breasts or nipples.   You have an unusual discharge from your nipples.   You feel a new lump or unusually thick areas.    This information is not intended to replace advice given to you by your health care provider. Make sure you discuss any questions you have with your health care provider.   Document Released: 05/19/2005 Document Revised: 05/05/2012 Document Reviewed: 09/03/2011 Elsevier Interactive Patient Education 2016 Elsevier Inc[employer]  

## 2016-02-14 ENCOUNTER — Ambulatory Visit: Payer: 59 | Admitting: Cardiology

## 2016-03-06 ENCOUNTER — Ambulatory Visit (INDEPENDENT_AMBULATORY_CARE_PROVIDER_SITE_OTHER): Payer: 59

## 2016-03-06 ENCOUNTER — Other Ambulatory Visit: Payer: Self-pay

## 2016-03-06 DIAGNOSIS — R000 Tachycardia, unspecified: Secondary | ICD-10-CM | POA: Diagnosis not present

## 2016-03-11 ENCOUNTER — Ambulatory Visit
Admission: RE | Admit: 2016-03-11 | Discharge: 2016-03-11 | Disposition: A | Discharge status: Home / Self Care | Payer: 59 | Source: Ambulatory Visit | Attending: Cardiology | Admitting: Cardiology

## 2016-03-11 DIAGNOSIS — R000 Tachycardia, unspecified: Secondary | ICD-10-CM | POA: Diagnosis present

## 2016-03-18 ENCOUNTER — Encounter: Payer: Self-pay | Admitting: Cardiology

## 2016-03-18 ENCOUNTER — Ambulatory Visit (INDEPENDENT_AMBULATORY_CARE_PROVIDER_SITE_OTHER): Payer: 59 | Admitting: Cardiology

## 2016-03-18 ENCOUNTER — Other Ambulatory Visit: Payer: Self-pay | Admitting: Internal Medicine

## 2016-03-18 VITALS — BP 100/70 | HR 68 | Ht 66.0 in | Wt 212.1 lb

## 2016-03-18 DIAGNOSIS — R000 Tachycardia, unspecified: Secondary | ICD-10-CM | POA: Diagnosis not present

## 2016-03-18 DIAGNOSIS — R002 Palpitations: Secondary | ICD-10-CM | POA: Diagnosis not present

## 2016-03-18 DIAGNOSIS — J382 Nodules of vocal cords: Secondary | ICD-10-CM

## 2016-03-18 MED ORDER — PANTOPRAZOLE SODIUM 40 MG PO TBEC: 40.0000 mg | DELAYED_RELEASE_TABLET | Freq: Every day | ORAL | 0 refills | Status: DC

## 2016-03-18 NOTE — Patient Instructions (Signed)
Medication Instructions:  Your physician has recommended you make the following change in your medication:  1. Start Protonix 40 mg once daily for 8 weeks.   Testing/Procedures: If after this you would like to try the xio patch just let us know.   Follow-Up: Your physician recommends that you schedule a follow-up appointment as needed with Dr. Yvone Neu.  It was a pleasure seeing you today here in the office. Please do not hesitate to give [place] a call back if you have any further questions. [place], BSN

## 2016-03-18 NOTE — Progress Notes (Signed)
Cardiology Office Note   Date:  03/19/2016   ID:  Brittany Baldwin, DOB [birth year], MRN [id number]  Referring Doctor:  Halina Maidens, MD   Cardiologist:   Wende Bushy, MD   Reason for consultation:  Chief Complaint  Patient presents with  . Follow-up    NO COMPLAINTS.      History of Present Illness: Brittany Baldwin is a 45 y.o. female who presents for Follow-up after test   Patient was previously followed at [facility] electrophysiology for SVT. 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 Despite ablation, patient continued to be symptomatic with palpitations. Diagnosed to have inappropriate sinus tachycardia. She has tried different medications including calcium channel blocker, bisoprolol, others unrecalled. Noted best response to propranolol. recently in the last several months or so, she is noted worsening of the palpitations. More frequent in episodes, intensity about the same as before, symptoms mainly in the chest, feeling heart racing, lasting a few seconds at a time, randomly occurring.  Had issues with reflux/GERD in the past. Underwent fundoplication. She had been on PPI previously but not in the last year or so.  She underwent stress echocardiogram in June 2016. It was negative for ischemia.  Since her last visit, she continues to have similar episodes of palpitations. When asked if she had symptoms during that she had a monitor on, she responded that she did have episodes. No episodes of loss of consciousness. No chest pain, no shortness of breath.  ROS:  Please see the history of present illness. Aside from mentioned under HPI, all other systems are reviewed and negative.     Past Medical History:  Diagnosis Date  . Anemia    H/O   . Anxiety   . Dysrhythmia    SVT-HAD CARDIAC ABLATION DONE IN 2016 BUT PT STATES SHE STILL GETS INTERMITTENT TACHYCARDIC AND IS SYPMPTOMATIC WITH SOB, DIZZINESS DURING TACHY EPISODES (06-25-15)  . Fibroid    . GERD (gastroesophageal reflux disease)   . Headache    H/O  . Heart murmur    as child  . Hyperlipemia   . Shortness of breath dyspnea    ASSOCIATED WITH TACHYCARDIA  . Tachycardia     Past Surgical History:  Procedure Laterality Date  . ABDOMINAL HYSTERECTOMY Bilateral 07/02/2015   Procedure: HYSTERECTOMY ABDOMINAL WITH BS;  Surgeon: Brayton Mars, MD;  Location: [facility] ORS;  Service: Gynecology;  Laterality: Bilateral;  . APPENDECTOMY    . CARDIAC ELECTROPHYSIOLOGY STUDY AND ABLATION    . CERVICAL CERCLAGE     McDonald cerclage 4; Shirodkar cerclage 1  . CESAREAN SECTION    . CHOLECYSTECTOMY    . CYSTOSCOPY Bilateral 07/02/2015   Procedure: CYSTOSCOPY;  Surgeon: Brayton Mars, MD;  Location: [facility] ORS;  Service: Gynecology;  Laterality: Bilateral;  . DILATION AND CURETTAGE OF UTERUS    . LAPAROTOMY N/A 07/11/2015   Procedure: hematoma evacuation;  Surgeon: Brayton Mars, MD;  Location: [facility] ORS;  Service: Gynecology;  Laterality: N/A;  . NISSEN FUNDOPLICATION    . NOVASURE ABLATION    . TUBAL LIGATION       reports that she has never smoked. She has never used smokeless tobacco. She reports that she does not drink alcohol or use drugs.   family history includes Congestive Heart Failure in her father; Diabetes in her mother; Heart failure in her father; Prostate cancer in her father.   Outpatient Medications Prior to Visit  Medication Sig Dispense Refill  .  atorvastatin (LIPITOR) 20 MG tablet TK 1 T PO Q NIGHT    . Biotin 1 MG CAPS Take by mouth.    . propranolol (INDERAL) 20 MG tablet TK 1 T PO PRN    . propranolol ER (INDERAL LA) 80 MG 24 hr capsule TK ONE C PO BID     No facility-administered medications prior to visit.      Allergies: Codeine; Hydrocodone-acetaminophen; Aspirin; Diphenhydramine hcl; and Adhesive [tape]    PHYSICAL EXAM: VS:  BP 100/70 (BP Location: Left Arm, Patient Position: Sitting, Cuff Size: Normal)   Pulse 68   Ht 5'  6" (1.676 m)   Wt 212 lb 1.9 oz (96.2 kg)   LMP 06/26/2015 (Exact Date)   BMI 34.24 kg/m  , Body mass index is 34.24 kg/m. Wt Readings from Last 3 Encounters:  03/18/16 212 lb 1.9 oz (96.2 kg)  02/13/16 209 lb (94.8 kg)  02/13/16 209 lb 8 oz (95 kg)    GENERAL:  well developed, well nourished, obese, not in acute distress HEENT: normocephalic, pink conjunctivae, anicteric sclerae, no xanthelasma, normal dentition, oropharynx clear NECK:  no neck vein engorgement, JVP normal, no hepatojugular reflux, carotid upstroke brisk and symmetric, no bruit, no thyromegaly, no lymphadenopathy LUNGS:  good respiratory effort, clear to auscultation bilaterally CV:  PMI not displaced, no thrills, no lifts, S1 and S2 within normal limits, no palpable S3 or S4, no murmurs, no rubs, no gallops ABD:  Soft, nontender, nondistended, normoactive bowel sounds, no abdominal aortic bruit, no hepatomegaly, no splenomegaly MS: nontender back, no kyphosis, no scoliosis, no joint deformities EXT:  2+ DP/PT pulses, no edema, no varicosities, no cyanosis, no clubbing SKIN: warm, nondiaphoretic, normal turgor, no ulcers NEUROPSYCH: alert, oriented to person, place, and time, sensory/motor grossly intact, normal mood, appropriate affect  Recent Labs: 12/05/2015: B Natriuretic Peptide 11.0; BUN 13; Creatinine, Ser 0.96; Hemoglobin 13.2; Platelets 204; Potassium 4.0; Sodium 137; TSH 3.866   Lipid Panel No results found for: CHOL, TRIG, HDL, CHOLHDL, VLDL, LDLCALC, LDLDIRECT   Other studies Reviewed:  EKG:  The ekg from 02/13/2016 was personally reviewed by me and it revealed sinus rhythm, 74 BPM.  Additional studies/ records that were reviewed personally reviewed by me today include: Stress echo 11/16/2014: Normal Stress Echocardiogram NORMAL EXERCISE ECG NORMAL STRESS ECHO IMAGES  Echo 03/06/2016: Left ventricle: The cavity size was normal. Systolic function was   normal. The estimated ejection fraction was in  the range of 60%   to 65%. Wall motion was normal; there were no regional wall   motion abnormalities. Left ventricular diastolic function   parameters were normal. - Mitral valve: There was mild regurgitation. - Left atrium: The atrium was normal in size. - Right ventricle: Systolic function was normal. - Pulmonary arteries: Systolic pressure was within the normal   range. - Inferior vena cava: The vessel was normal in size. The   respirophasic diameter changes were in the normal range (= 50%),   consistent with normal central venous pressure.  Holter 03/06/2016: 24-hour Holter monitor  Overall rhythm is sinus, heart rate ranged from 64-121 bpm, average of 87 BPM.  No high grade supraventricular ectopy: 6 isolated PACs.  No high-grade ventricular ectopy: 444 isolated PVCs. No ventricular runs.  No evidence of atrial flutter ablation. No evidence of AV conduction disease.  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 There is no evidence of significant ectopy or significant arrhythmia noted on the Holter. Patient reported that she did have episodes of palpitations during the time that she had a Holter monitor on. Despite that, no clear evidence of arrhythmia noted on the Holter monitor. Patient insists that she continues to have palpitations. She wonders why previous monitors including the more recent one do not show anything. We discussed options of considering monitoring for longer duration including a zio patch and an event monitor. We also discussed doing a trial of treatment with PPI to see if her symptoms are actually related to a GI pathology/heartburn. Patient would like to do the trial of PPI first. Patient prescribed Protonix 40  mg by mouth 4 a total of 8 weeks. No refills. Patient should follow-up for PCP for this. If this height trial with PPI treatment, she continues to have symptoms, she will then consider an event monitor or a ziopatch. Lengthy discussion with the patient regarding nature and pathophysiology of inappropriate sinus tachycardia if, indeed, that is her diagnosis. Emphasized that treatment of inappropriate sinus tachycardia may be difficult. Patient verbalized understanding  Patient requested that we write on a form that we recommend that she walk around while at her job to relieve herself of ankle edema. She says that she sits at her desk most of the time and she notices puffiness in her feet and ankles at the end of the day.  Paperwork was also filled out pertaining to ongoing workup.  Current medicines are reviewed at length with the patient today.  The patient does not have concerns regarding medicines.  Labs/ tests ordered today include:  No orders of the defined types were placed in this encounter.   I had a lengthy and detailed discussion with the patient regarding diagnoses, prognosis, diagnostic options, treatment options , and side effects of medications.   I counseled the patient on importance of lifestyle modification including heart healthy diet, regular physical activity  Disposition:   FU with undersigned after tests   I spent at least 50 minutes with the patient today and more than 50% of the time was spent counseling the patient and coordinating care.     Signed, Wende Bushy, MD  03/19/2016 12:25 PM    [place]  This note was generated in part with voice recognition software and I apologize for any typographical errors that were not detected and corrected.

## 2016-03-19 ENCOUNTER — Encounter: Payer: Self-pay | Admitting: Cardiology

## 2016-03-19 ENCOUNTER — Telehealth: Payer: Self-pay | Admitting: Internal Medicine

## 2016-03-19 ENCOUNTER — Telehealth: Payer: Self-pay | Admitting: Cardiology

## 2016-03-19 NOTE — Telephone Encounter (Signed)
Patient would like to know if there is a cardiologist that specializes specifically with Tachycardia - patient is in the middle of open enrollment. Stated that her insurance currently requires a referral however she also mentioned the tachycardia in her last visit with Dr.Berglund. Please call patient back at your earliest convenience. Patient also stated that it is okay to leave a detailed message on patient's vm.

## 2016-03-19 NOTE — Telephone Encounter (Signed)
Left detailed voicemail message that form will be faxed to [place] with instructions to call back if she would like a copy of the form.

## 2016-03-20 NOTE — Telephone Encounter (Signed)
Left detailed message regarding the cardio appt she had and the notes we saw.

## 2016-03-26 ENCOUNTER — Telehealth: Payer: Self-pay | Admitting: Cardiology

## 2016-03-26 DIAGNOSIS — R002 Palpitations: Secondary | ICD-10-CM

## 2016-03-26 NOTE — Telephone Encounter (Signed)
Left voicemail message for her to call back.  

## 2016-03-26 NOTE — Telephone Encounter (Signed)
Left voicemail message for patient to call back.

## 2016-03-26 NOTE — Telephone Encounter (Signed)
Pt calling stating yesterday while at work she all the sudden was with some heat from head to chest This went on for about 45 minutes He heart was racing and hour later her heart was "pounding"  Took her meds that helps calm her down  It felt like a hot flash but not sure, what it was  Would like some advise on this Please call  This is something very new to her She states she is not feeling this today at all

## 2016-03-26 NOTE — Telephone Encounter (Signed)
Patient states that she had an episode at work where she got really hot from head to chest and it went on for about 45 minutes. She also reported that her heart was racing and later it was pounding. She took her immediate release propranolol and that helped her calm down but she was concerned about it and wanted to see what we thought. Let her know that I had spoke with Dr. Yvone Neu regarding her symptoms and that we recommend she place a Zio patch to wear for 14 days so that we can see what is going on. She verbalized understanding, agreement with plan, and had no further questions at this time. She is unable to schedule it at this time due to being at work but she will call back and schedule when she gets off work.

## 2016-03-31 ENCOUNTER — Telehealth: Payer: Self-pay | Admitting: Cardiology

## 2016-03-31 NOTE — Telephone Encounter (Signed)
Pt states Dr. Yvone Neu submitted an accomodation form to her employer. States her employer needs to have a note that she may have appointments periodically. Please call to discuss.

## 2016-03-31 NOTE — Telephone Encounter (Signed)
As I do not see a note in her chart to her employer, will forward to Dr. Tora Kindred nurse.

## 2016-03-31 NOTE — Telephone Encounter (Signed)
Pt states she goes to lunch at 12:15 for 30 minutes, if someone can call during that time.

## 2016-04-01 NOTE — Telephone Encounter (Signed)
Left voicemail message that Dr. Yvone Neu updated form with additional information and I will fax that over to [place] as she requested and to call if she has any questions.

## 2016-04-01 NOTE — Telephone Encounter (Signed)
Reviewed updated form with patient and she was very upset that we are not seeing her more frequent to address her concerns. Let her know that we are pending results and that we need to capture what is going on so that we can then treat it. She states that she has already had more than 4 appointments with [place] and that short notice appointments need to be listed on the form in order for them to not count against her. Let her know that they may need another form due to multiple edits. Let her know that I would speak with Dr. Yvone Neu regarding the form and her requests.

## 2016-04-01 NOTE — Telephone Encounter (Signed)
Patient states that she needs her form to reflect periodic appointments that she may need for evaluation. Offered her a letter with each visit but states that it needs to be reflected on the form so that it would be covered. Let her know that I would discuss with Dr. Yvone Neu today and see if we can update form and then refax to the number she provided. She verbalized understanding and had no further questions at this time.

## 2016-04-03 ENCOUNTER — Other Ambulatory Visit (INDEPENDENT_AMBULATORY_CARE_PROVIDER_SITE_OTHER): Payer: 59

## 2016-04-03 DIAGNOSIS — R002 Palpitations: Secondary | ICD-10-CM | POA: Diagnosis not present

## 2016-04-09 ENCOUNTER — Telehealth: Payer: Self-pay | Admitting: Cardiology

## 2016-04-09 NOTE — Telephone Encounter (Signed)
Patients Medical accommodation form updated to include Medical appointments/treatments and faxed back to [place]. Left detailed voicemail for patient regarding updated form and to call back if any questions.

## 2016-04-21 ENCOUNTER — Other Ambulatory Visit: Payer: Self-pay | Admitting: Internal Medicine

## 2016-04-21 MED ORDER — ATORVASTATIN CALCIUM 20 MG PO TABS: 20.0000 mg | ORAL_TABLET | Freq: Every day | ORAL | 1 refills | Status: DC

## 2016-04-30 ENCOUNTER — Telehealth: Payer: Self-pay | Admitting: Cardiology

## 2016-04-30 NOTE — Telephone Encounter (Signed)
Returning your call. °

## 2016-04-30 NOTE — Telephone Encounter (Signed)
See results note. 

## 2016-05-18 ENCOUNTER — Other Ambulatory Visit: Payer: Self-pay | Admitting: Cardiology

## 2016-05-19 NOTE — Telephone Encounter (Signed)
Ok to refill 

## 2016-06-03 ENCOUNTER — Other Ambulatory Visit: Payer: Self-pay | Admitting: Internal Medicine

## 2016-06-20 ENCOUNTER — Ambulatory Visit: Payer: BLUE CROSS/BLUE SHIELD | Admitting: Internal Medicine

## 2016-06-26 ENCOUNTER — Ambulatory Visit (INDEPENDENT_AMBULATORY_CARE_PROVIDER_SITE_OTHER): Payer: BLUE CROSS/BLUE SHIELD | Admitting: Internal Medicine

## 2016-06-26 ENCOUNTER — Encounter: Payer: Self-pay | Admitting: Internal Medicine

## 2016-06-26 VITALS — BP 99/64 | HR 92 | Temp 101.9°F | Ht 66.0 in | Wt 219.0 lb

## 2016-06-26 DIAGNOSIS — J111 Influenza due to unidentified influenza virus with other respiratory manifestations: Secondary | ICD-10-CM | POA: Diagnosis not present

## 2016-06-26 LAB — POCT INFLUENZA A/B
INFLUENZA A, POC: NEGATIVE
INFLUENZA B, POC: NEGATIVE

## 2016-06-26 MED ORDER — BENZONATATE 200 MG PO CAPS: 200.0000 mg | ORAL_CAPSULE | Freq: Three times a day (TID) | ORAL | 0 refills | Status: DC | PRN

## 2016-06-26 MED ORDER — OSELTAMIVIR PHOSPHATE 75 MG PO CAPS: 75.0000 mg | ORAL_CAPSULE | Freq: Two times a day (BID) | ORAL | 0 refills | Status: DC

## 2016-06-26 NOTE — Progress Notes (Signed)
Date:  06/26/2016   Name:  Brittany Baldwin   DOB:  08/12/1970   MRN:  [id number]   Chief Complaint: Sore Throat (Pt stated sore throat, chills, coughing, chest pain since yesterday.) Cough  This is a new problem. The current episode started yesterday. The problem has been gradually worsening. The cough is non-productive. Associated symptoms include chills, a fever, myalgias, a sore throat and wheezing. Pertinent negatives include no chest pain. She has tried OTC cough suppressant for the symptoms. The treatment provided no relief.  She did not get a flu shot this year.  Review of Systems  Constitutional: Positive for chills and fever.  HENT: Positive for sore throat.   Eyes: Negative for visual disturbance.  Respiratory: Positive for cough, chest tightness and wheezing.   Cardiovascular: Negative for chest pain, palpitations and leg swelling.  Gastrointestinal: Negative for abdominal pain, diarrhea, nausea and vomiting.  Genitourinary: Negative for difficulty urinating.  Musculoskeletal: Positive for myalgias.    Patient Active Problem List   Diagnosis Date Noted  . 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 Status post abdominal hysterectomy 07/02/2015  . History of pulmonary embolus during pregnancy 05/16/2015  . Tachycardia 05/10/2015  . History of fundoplication [id number]  . Gastro-esophageal reflux disease without esophagitis 03/28/2014  . Combined fat and carbohydrate induced hyperlipemia 03/28/2014    Prior to Admission medications   Medication Sig Start Date End Date Taking? Authorizing Provider  atorvastatin (LIPITOR) 20 MG tablet Take 1 tablet (20 mg total) by mouth daily at 6 PM. 04/21/16  Yes Glean Hess, MD  Biotin 1 MG CAPS Take by mouth.   Yes Historical Provider, MD  Chlorpheniramine-DM (CORICIDIN HBP COUGH/COLD PO) Take by mouth.   Yes Historical Provider, MD  pantoprazole (PROTONIX) 40 MG tablet TAKE 1 TABLET BY MOUTH EVERY DAY 06/04/16  Yes Glean Hess, MD    propranolol (INDERAL) 20 MG tablet TK 1 T PO PRN 11/14/14  Yes Historical Provider, MD  propranolol ER (INDERAL LA) 80 MG 24 hr capsule TK ONE C PO BID 01/14/15  Yes Historical Provider, MD    Allergies  Allergen Reactions  . Codeine Shortness Of Breath  . Hydrocodone-Acetaminophen Rash  . Aspirin Other (See Comments)    Stomach issues  . Diphenhydramine Hcl Other (See Comments)    Through an I.V. Drip in addition to other meds  . Adhesive [Tape] Rash    ELECTRODES FROM HOLTER MONITOR CAUSED RASH    Past Surgical History:  Procedure Laterality Date  . ABDOMINAL HYSTERECTOMY Bilateral 07/02/2015   Procedure: HYSTERECTOMY ABDOMINAL WITH BS;  Surgeon: Brayton Mars, MD;  Location: [facility] ORS;  Service: Gynecology;  Laterality: Bilateral;  . APPENDECTOMY    . CARDIAC ELECTROPHYSIOLOGY STUDY AND ABLATION    . CERVICAL CERCLAGE     McDonald cerclage 4; Shirodkar cerclage 1  . CESAREAN SECTION    . CHOLECYSTECTOMY    . CYSTOSCOPY Bilateral 07/02/2015   Procedure: CYSTOSCOPY;  Surgeon: Brayton Mars, MD;  Location: [facility] ORS;  Service: Gynecology;  Laterality: Bilateral;  . DILATION AND CURETTAGE OF UTERUS    . LAPAROTOMY N/A 07/11/2015   Procedure: hematoma evacuation;  Surgeon: Brayton Mars, MD;  Location: [facility] ORS;  Service: Gynecology;  Laterality: N/A;  . NISSEN FUNDOPLICATION    . NOVASURE ABLATION    . TUBAL LIGATION      Social History  Substance Use Topics  . Smoking status: Never Smoker  . Smokeless tobacco: Never Used  . Alcohol  use No     Medication list has been reviewed and updated.   Physical Exam  Constitutional: She is oriented to person, place, and time. She appears well-developed. She has a sickly appearance. No distress.  HENT:  Head: Normocephalic and atraumatic.  Cardiovascular: Normal rate, regular rhythm and normal heart sounds.   Pulmonary/Chest: Effort normal. No respiratory distress. She has decreased breath sounds. She has no  wheezes. She has no rhonchi.  Musculoskeletal: Normal range of motion.  Neurological: She is alert and oriented to person, place, and time.  Skin: Skin is warm and dry. No rash noted.  Psychiatric: She has a normal mood and affect. Her behavior is normal. Thought content normal.  Nursing note and vitals reviewed.   BP 99/64   Pulse 92   Temp (!) 101.9 F (38.8 C)   Ht 5\' 6"  (1.676 m)   Wt 219 lb (99.3 kg)   LMP 06/26/2015 (Exact Date)   SpO2 96%   BMI 35.35 kg/m   Assessment and Plan: 1. Influenza Tylenol, rest, fluids No albuterol or codeine syrup due to intolerance - oseltamivir (TAMIFLU) 75 MG capsule; Take 1 capsule (75 mg total) by mouth 2 (two) times daily.  Dispense: 10 capsule; Refill: 0 - benzonatate (TESSALON) 200 MG capsule; Take 1 capsule (200 mg total) by mouth 3 (three) times daily as needed for cough.  Dispense: 30 capsule; Refill: 0 - POCT Influenza A/B   Halina Maidens, MD [facility]  06/26/2016

## 2016-06-26 NOTE — Patient Instructions (Signed)
Tylenol 650 mg four times a day  Fluids, rest and out of work until fever resolved

## 2016-06-30 ENCOUNTER — Telehealth: Payer: Self-pay | Admitting: *Deleted

## 2016-06-30 NOTE — Telephone Encounter (Signed)
Patient called and states that she seen Dr .Army Melia and she is still experiencing the chest congestions and ear pain ( right ) . She is wondering if Dr Army Melia can send her something in for congestion relief.  Patient is requesting a call back . Her call back number is [phone]. Please advise provider .

## 2016-07-01 ENCOUNTER — Other Ambulatory Visit: Payer: Self-pay | Admitting: Internal Medicine

## 2016-07-01 MED ORDER — AZITHROMYCIN 250 MG PO TABS: ORAL_TABLET | ORAL | 0 refills | Status: DC

## 2016-07-01 NOTE — Telephone Encounter (Signed)
Dr. Army Melia send Z-pack to [facility] to [facility]

## 2016-07-02 ENCOUNTER — Encounter: Payer: BLUE CROSS/BLUE SHIELD | Admitting: Internal Medicine

## 2016-07-03 ENCOUNTER — Encounter: Payer: Self-pay | Admitting: Internal Medicine

## 2016-07-03 ENCOUNTER — Ambulatory Visit (INDEPENDENT_AMBULATORY_CARE_PROVIDER_SITE_OTHER): Payer: BLUE CROSS/BLUE SHIELD | Admitting: Internal Medicine

## 2016-07-03 VITALS — BP 110/70 | HR 78 | Temp 97.6°F | Ht 66.0 in | Wt 213.0 lb

## 2016-07-03 DIAGNOSIS — E349 Endocrine disorder, unspecified: Secondary | ICD-10-CM | POA: Diagnosis not present

## 2016-07-03 DIAGNOSIS — R000 Tachycardia, unspecified: Secondary | ICD-10-CM

## 2016-07-03 DIAGNOSIS — Z1239 Encounter for other screening for malignant neoplasm of breast: Secondary | ICD-10-CM

## 2016-07-03 DIAGNOSIS — R7989 Other specified abnormal findings of blood chemistry: Secondary | ICD-10-CM

## 2016-07-03 DIAGNOSIS — E782 Mixed hyperlipidemia: Secondary | ICD-10-CM | POA: Diagnosis not present

## 2016-07-03 DIAGNOSIS — K219 Gastro-esophageal reflux disease without esophagitis: Secondary | ICD-10-CM | POA: Diagnosis not present

## 2016-07-03 DIAGNOSIS — Z0000 Encounter for general adult medical examination without abnormal findings: Secondary | ICD-10-CM

## 2016-07-03 DIAGNOSIS — Z1231 Encounter for screening mammogram for malignant neoplasm of breast: Secondary | ICD-10-CM

## 2016-07-03 HISTORY — DX: Other specified abnormal findings of blood chemistry: R79.89

## 2016-07-03 HISTORY — DX: Endocrine disorder, unspecified: E34.9

## 2016-07-03 LAB — POCT URINALYSIS DIPSTICK
Bilirubin, UA: NEGATIVE
GLUCOSE UA: NEGATIVE
Ketones, UA: NEGATIVE
Leukocytes, UA: NEGATIVE
NITRITE UA: NEGATIVE
PROTEIN UA: NEGATIVE
SPEC GRAV UA: 1.015
UROBILINOGEN UA: 0.2
pH, UA: 5

## 2016-07-03 MED ORDER — ATORVASTATIN CALCIUM 10 MG PO TABS: 10.0000 mg | ORAL_TABLET | Freq: Every day | ORAL | 3 refills | Status: DC

## 2016-07-03 NOTE — Patient Instructions (Signed)
Breast Self-Awareness Introduction Breast self-awareness means being familiar with how your breasts look and feel. It involves checking your breasts regularly and reporting any changes to your health care provider. Practicing breast self-awareness is important. A change in your breasts can be a sign of a serious medical problem. Being familiar with how your breasts look and feel allows you to find any problems early, when treatment is more likely to be successful. All women should practice breast self-awareness, including women who have had breast implants. How to do a breast self-exam One way to learn what is normal for your breasts and whether your breasts are changing is to do a breast self-exam. To do a breast self-exam: Look for Changes  1. Remove all the clothing above your waist. 2. Stand in front of a mirror in a room with good lighting. 3. Put your hands on your hips. 4. Push your hands firmly downward. 5. Compare your breasts in the mirror. Look for differences between them (asymmetry), such as:  Differences in shape.  Differences in size.  Puckers, dips, and bumps in one breast and not the other. 6. Look at each breast for changes in your skin, such as:  Redness.  Scaly areas. 7. Look for changes in your nipples, such as:  Discharge.  Bleeding.  Dimpling.  Redness.  A change in position. Feel for Changes  Carefully feel your breasts for lumps and changes. It is best to do this while lying on your back on the floor and again while sitting or standing in the shower or tub with soapy water on your skin. Feel each breast in the following way:  Place the arm on the side of the breast you are examining above your head.  Feel your breast with the other hand.  Start in the nipple area and make  inch (2 cm) overlapping circles to feel your breast. Use the pads of your three middle fingers to do this. Apply light pressure, then medium pressure, then firm pressure. The light  pressure will allow you to feel the tissue closest to the skin. The medium pressure will allow you to feel the tissue that is a little deeper. The firm pressure will allow you to feel the tissue close to the ribs.  Continue the overlapping circles, moving downward over the breast until you feel your ribs below your breast.  Move one finger-width toward the center of the body. Continue to use the  inch (2 cm) overlapping circles to feel your breast as you move slowly up toward your collarbone.  Continue the up and down exam using all three pressures until you reach your armpit. Write Down What You Find  Write down what is normal for each breast and any changes that you find. Keep a written record with breast changes or normal findings for each breast. By writing this information down, you do not need to depend only on memory for size, tenderness, or location. Write down where you are in your menstrual cycle, if you are still menstruating. If you are having trouble noticing differences in your breasts, do not get discouraged. With time you will become more familiar with the variations in your breasts and more comfortable with the exam. How often should I examine my breasts? Examine your breasts every month. If you are breastfeeding, the best time to examine your breasts is after a feeding or after using a breast pump. If you menstruate, the best time to examine your breasts is 5-7 days after your  period is over. During your period, your breasts are lumpier, and it may be more difficult to notice changes. When should I see my health care provider? See your health care provider if you notice:  A change in shape or size of your breasts or nipples.  A change in the skin of your breast or nipples, such as a reddened or scaly area.  Unusual discharge from your nipples.  A lump or thick area that was not there before.  Pain in your breasts.  Anything that concerns you. This information is not  intended to replace advice given to you by your health care provider. Make sure you discuss any questions you have with your health care provider. Document Released: 05/19/2005 Document Revised: 10/25/2015 Document Reviewed: 04/08/2015  2017 [employer]

## 2016-07-03 NOTE — Progress Notes (Signed)
Date:  07/03/2016   Name:  Brittany Baldwin   DOB:  1970-09-07   MRN:  [id number]   Chief Complaint: Annual Exam Brittany Baldwin is a 46 y.o. female who presents today for her Complete Annual Exam. She feels fairly well - recovering from the flu and subsequent bronchitis.  Currently taking Zpak. She reports exercising intermittently. She reports she is sleeping fairly well.  She is s/p hysterectomy but is due for mammogram. She was seen at [facility] in December - CMP, Thyroid were normal.  PTH was 127 with normal calcium. She also had lipids done at work in the fall - cholesterol total ~150.  Gastroesophageal Reflux  She complains of coughing. She reports no abdominal pain, no chest pain or no wheezing. Pertinent negatives include no fatigue. She has tried a PPI for the symptoms. The treatment provided significant relief.  Hyperlipidemia  This is a chronic problem. Pertinent negatives include no chest pain or shortness of breath. Current antihyperlipidemic treatment includes statins. The current treatment provides significant improvement of lipids.  Heart Problem  This is a recurrent (tachycardia s/p failed ablation) problem. The problem occurs intermittently. The problem has been unchanged. Associated symptoms include coughing. Pertinent negatives include no abdominal pain, arthralgias, chest pain, chills, congestion, fatigue, fever, headaches, joint swelling, rash or vomiting. Treatments tried: beta blockers.    Review of Systems  Constitutional: Negative for chills, fatigue and fever.  HENT: Negative for congestion, hearing loss, tinnitus, trouble swallowing and voice change.   Eyes: Negative for visual disturbance.  Respiratory: Positive for cough. Negative for chest tightness, shortness of breath and wheezing.   Cardiovascular: Positive for palpitations. Negative for chest pain and leg swelling.  Gastrointestinal: Negative for abdominal pain, constipation, diarrhea and vomiting.  Endocrine:  Negative for polydipsia and polyuria.  Genitourinary: Negative for dysuria and frequency.  Musculoskeletal: Negative for arthralgias, gait problem and joint swelling.  Skin: Negative for color change and rash.  Neurological: Negative for dizziness, tremors, light-headedness and headaches.  Hematological: Negative for adenopathy. Does not bruise/bleed easily.  Psychiatric/Behavioral: Negative for dysphoric mood and sleep disturbance. The patient is not nervous/anxious.     Patient Active Problem List   Diagnosis Date Noted  . 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 Status post abdominal hysterectomy 07/02/2015  . History of pulmonary embolus during pregnancy 05/16/2015  . Tachycardia 05/10/2015  . History of fundoplication 123456  . Gastro-esophageal reflux disease without esophagitis 03/28/2014  . Hyperlipidemia, mixed 03/28/2014    Prior to Admission medications   Medication Sig Start Date End Date Taking? Authorizing Provider  atorvastatin (LIPITOR) 20 MG tablet Take 1 tablet (20 mg total) by mouth daily at 6 PM. 04/21/16   Glean Hess, MD  azithromycin [facility]) 250 MG tablet UAD 07/01/16   Glean Hess, MD  benzonatate (TESSALON) 200 MG capsule Take 1 capsule (200 mg total) by mouth 3 (three) times daily as needed for cough. 06/26/16   Glean Hess, MD  Biotin 1 MG CAPS Take by mouth.    Historical Provider, MD  Chlorpheniramine-DM (CORICIDIN HBP COUGH/COLD PO) Take by mouth.    Historical Provider, MD  oseltamivir (TAMIFLU) 75 MG capsule Take 1 capsule (75 mg total) by mouth 2 (two) times daily. 06/26/16   Glean Hess, MD  pantoprazole (PROTONIX) 40 MG tablet TAKE 1 TABLET BY MOUTH EVERY DAY 06/04/16   Glean Hess, MD  propranolol (INDERAL) 20 MG tablet TK 1 T PO PRN 11/14/14   Historical Provider, MD  propranolol ER (INDERAL LA) 80 MG 24 hr capsule TK ONE C PO BID 01/14/15   Historical Provider, MD    Allergies  Allergen Reactions  . Codeine Shortness Of Breath   . Hydrocodone-Acetaminophen Rash  . Aspirin Other (See Comments)    Stomach issues  . Diphenhydramine Hcl Other (See Comments)    Through an I.V. Drip in addition to other meds  . Adhesive [Tape] Rash    ELECTRODES FROM HOLTER MONITOR CAUSED RASH    Past Surgical History:  Procedure Laterality Date  . ABDOMINAL HYSTERECTOMY Bilateral 07/02/2015   Procedure: HYSTERECTOMY ABDOMINAL WITH BS;  Surgeon: Brayton Mars, MD;  Location: [facility] ORS;  Service: Gynecology;  Laterality: Bilateral;  . APPENDECTOMY    . CARDIAC ELECTROPHYSIOLOGY STUDY AND ABLATION    . CHOLECYSTECTOMY    . CYSTOSCOPY Bilateral 07/02/2015   Procedure: CYSTOSCOPY;  Surgeon: Brayton Mars, MD;  Location: [facility] ORS;  Service: Gynecology;  Laterality: Bilateral;  . LAPAROTOMY N/A 07/11/2015   Procedure: hematoma evacuation;  Surgeon: Brayton Mars, MD;  Location: [facility] ORS;  Service: Gynecology;  Laterality: N/A;  . NISSEN FUNDOPLICATION    . TUBAL LIGATION      Social History  Substance Use Topics  . Smoking status: Never Smoker  . Smokeless tobacco: Never Used  . Alcohol use No     Medication list has been reviewed and updated.   Physical Exam  Constitutional: She is oriented to person, place, and time. She appears well-developed and well-nourished. No distress.  HENT:  Head: Normocephalic and atraumatic.  Right Ear: Tympanic membrane and ear canal normal.  Left Ear: Tympanic membrane and ear canal normal.  Nose: Right sinus exhibits no maxillary sinus tenderness. Left sinus exhibits no maxillary sinus tenderness.  Mouth/Throat: Uvula is midline and oropharynx is clear and moist.  Eyes: Conjunctivae and EOM are normal. Right eye exhibits no discharge. Left eye exhibits no discharge. No scleral icterus.  Neck: Normal range of motion. Carotid bruit is not present. No erythema present. No thyromegaly present.  Cardiovascular: Normal rate, regular rhythm, normal heart sounds and normal pulses.     Pulmonary/Chest: Effort normal. No respiratory distress. She has no wheezes. Right breast exhibits no mass, no nipple discharge, no skin change and no tenderness. Left breast exhibits no mass, no nipple discharge, no skin change and no tenderness.  Abdominal: Soft. Bowel sounds are normal. There is no hepatosplenomegaly. There is no tenderness. There is no CVA tenderness.  Musculoskeletal: Normal range of motion.  Lymphadenopathy:    She has no cervical adenopathy.    She has no axillary adenopathy.  Neurological: She is alert and oriented to person, place, and time. She has normal reflexes. No cranial nerve deficit or sensory deficit.  Skin: Skin is warm, dry and intact. No rash noted.  Psychiatric: She has a normal mood and affect. Her speech is normal and behavior is normal. Thought content normal.  Nursing note and vitals reviewed.   BP 110/70   Pulse 78   Temp 97.6 F (36.4 C)   Ht 5\' 6"  (1.676 m)   Wt 213 lb (96.6 kg)   LMP 06/26/2015 (Exact Date)   SpO2 98%   BMI 34.38 kg/m   Assessment and Plan: 1. Annual physical exam Resume regular exercise - POCT urinalysis dipstick  2. Breast cancer screening Rx given for [facility] - MM DIGITAL SCREENING BILATERAL  3. Gastro-esophageal reflux disease without esophagitis Stable on PPI  4. Hyperlipidemia, mixed Reduce dose of lipitor  to 10 mg - atorvastatin (LIPITOR) 10 MG tablet; Take 1 tablet (10 mg total) by mouth daily at 6 PM.  Dispense: 90 tablet; Refill: 3  5. Tachycardia Intermittent, controlled  6. Elevated parathyroid hormone Recommend recheck labs and OV in 5 months   Halina Maidens, MD [facility]  07/03/2016

## 2016-08-05 ENCOUNTER — Ambulatory Visit (INDEPENDENT_AMBULATORY_CARE_PROVIDER_SITE_OTHER): Payer: BLUE CROSS/BLUE SHIELD | Admitting: Internal Medicine

## 2016-08-05 ENCOUNTER — Encounter: Payer: Self-pay | Admitting: Internal Medicine

## 2016-08-05 VITALS — BP 114/60 | HR 89 | Ht 66.0 in | Wt 215.0 lb

## 2016-08-05 DIAGNOSIS — M722 Plantar fascial fibromatosis: Secondary | ICD-10-CM

## 2016-08-05 DIAGNOSIS — K219 Gastro-esophageal reflux disease without esophagitis: Secondary | ICD-10-CM

## 2016-08-05 DIAGNOSIS — R609 Edema, unspecified: Secondary | ICD-10-CM | POA: Insufficient documentation

## 2016-08-05 DIAGNOSIS — M62838 Other muscle spasm: Secondary | ICD-10-CM | POA: Insufficient documentation

## 2016-08-05 DIAGNOSIS — K5901 Slow transit constipation: Secondary | ICD-10-CM | POA: Insufficient documentation

## 2016-08-05 HISTORY — DX: Edema, unspecified: R60.9

## 2016-08-05 HISTORY — DX: Plantar fascial fibromatosis: M72.2

## 2016-08-05 MED ORDER — LINACLOTIDE 290 MCG PO CAPS: 290.0000 ug | ORAL_CAPSULE | Freq: Every day | ORAL | 2 refills | Status: DC

## 2016-08-05 MED ORDER — METHOCARBAMOL 500 MG PO TABS: 500.0000 mg | ORAL_TABLET | Freq: Three times a day (TID) | ORAL | 1 refills | Status: DC | PRN

## 2016-08-05 MED ORDER — PANTOPRAZOLE SODIUM 40 MG PO TBEC: 40.0000 mg | DELAYED_RELEASE_TABLET | Freq: Every day | ORAL | 1 refills | Status: DC

## 2016-08-05 NOTE — Progress Notes (Signed)
Date:  08/05/2016   Name:  Brittany Baldwin   DOB:  [birth year]   MRN:  [id number]   Chief Complaint: pinched nerve (Rt side of shoulder and gets worse and radiates down arm. ); Edema (Both feet swell around ankle area. Wants referral to podiatrist. ); and Abdominal Pain (Pain in stomach. Can go 4 or 5 days without BM. Stool seems hard and feels like "can't get it all out." Laxative helps, but doesn't relieve completely. ) Constipation  This is a new problem. The problem has been waxing and waning since onset. Her stool frequency is 2 to 3 times per week. The stool is described as pellet like and firm. The patient is on a high fiber diet. There has been adequate water intake. Pertinent negatives include no abdominal pain, diarrhea, fever or rectal pain. She has tried diet changes, fiber, laxatives and stool softeners for the symptoms. The treatment provided moderate relief.  Shoulder Pain   The pain is present in the right shoulder. This is a chronic problem. The current episode started more than 1 year ago. The problem occurs intermittently. Associated symptoms include numbness (down right arm when neck and shoulder spasms) and tingling. Pertinent negatives include no fever. The symptoms are aggravated by activity.  Gastroesophageal Reflux  She complains of heartburn. She reports no abdominal pain or no chest pain. This is a recurrent problem. The problem occurs rarely. Pertinent negatives include no fatigue. She has tried a PPI for the symptoms. The treatment provided significant relief.   Edema - has some bilateral heel pain as well.  Also having bilateral heel pain.  Hurts more in the AM and after sitting.   Review of Systems  Constitutional: Negative for chills, fatigue and fever.  Respiratory: Negative for chest tightness and shortness of breath.   Cardiovascular: Positive for leg swelling (mild ankle edema). Negative for chest pain.  Gastrointestinal: Positive for constipation and heartburn.  Negative for abdominal pain, blood in stool, diarrhea and rectal pain.  Musculoskeletal: Positive for arthralgias and neck pain.  Skin: Negative for rash.  Neurological: Positive for tingling and numbness (down right arm when neck and shoulder spasms). Negative for dizziness, syncope, weakness and headaches.  Hematological: Negative for adenopathy.  Psychiatric/Behavioral: Negative for sleep disturbance.    Patient Active Problem List   Diagnosis Date Noted  . Elevated parathyroid hormone 07/03/2016  . 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 Status post abdominal hysterectomy 07/02/2015  . History of pulmonary embolus during pregnancy 05/16/2015  . Tachycardia 05/10/2015  . History of fundoplication 123456  . Gastro-esophageal reflux disease without esophagitis 03/28/2014  . Hyperlipidemia, mixed 03/28/2014    Prior to Admission medications   Medication Sig Start Date End Date Taking? Authorizing Provider  atorvastatin (LIPITOR) 10 MG tablet Take 1 tablet (10 mg total) by mouth daily at 6 PM. 07/03/16  Yes Glean Hess, MD  Biotin 1 MG CAPS Take by mouth.   Yes Historical Provider, MD  pantoprazole (PROTONIX) 40 MG tablet TAKE 1 TABLET BY MOUTH EVERY DAY 06/04/16  Yes Glean Hess, MD  propranolol (INDERAL) 20 MG tablet TK 1 T PO PRN 11/14/14  Yes Historical Provider, MD  propranolol ER (INDERAL LA) 80 MG 24 hr capsule TK ONE C PO BID 01/14/15  Yes Historical Provider, MD  azithromycin (ZITHROMAX) 250 MG tablet UAD Patient not taking: Reported on 08/05/2016 07/01/16   Glean Hess, MD  benzonatate (TESSALON) 200 MG capsule Take 1 capsule (200 mg total) by  mouth 3 (three) times daily as needed for cough. Patient not taking: Reported on 08/05/2016 06/26/16   Glean Hess, MD  Chlorpheniramine-DM (CORICIDIN HBP COUGH/COLD PO) Take by mouth.    Historical Provider, MD    Allergies  Allergen Reactions  . Codeine Shortness Of Breath  . Hydrocodone-Acetaminophen Rash  . Aspirin Other  (See Comments)    Stomach issues  . Diphenhydramine Hcl Other (See Comments)    Through an I.V. Drip in addition to other meds  . Adhesive [Tape] Rash    ELECTRODES FROM HOLTER MONITOR CAUSED RASH    Past Surgical History:  Procedure Laterality Date  . ABDOMINAL HYSTERECTOMY Bilateral 07/02/2015   Procedure: HYSTERECTOMY ABDOMINAL WITH BS;  Surgeon: Brayton Mars, MD;  Location: [facility] ORS;  Service: Gynecology;  Laterality: Bilateral;  . APPENDECTOMY    . CARDIAC ELECTROPHYSIOLOGY STUDY AND ABLATION    . CHOLECYSTECTOMY    . CYSTOSCOPY Bilateral 07/02/2015   Procedure: CYSTOSCOPY;  Surgeon: Brayton Mars, MD;  Location: [facility] ORS;  Service: Gynecology;  Laterality: Bilateral;  . LAPAROTOMY N/A 07/11/2015   Procedure: hematoma evacuation;  Surgeon: Brayton Mars, MD;  Location: [facility] ORS;  Service: Gynecology;  Laterality: N/A;  . NISSEN FUNDOPLICATION    . TUBAL LIGATION      Social History  Substance Use Topics  . Smoking status: Never Smoker  . Smokeless tobacco: Never Used  . Alcohol use No     Medication list has been reviewed and updated.   Physical Exam  Constitutional: She is oriented to person, place, and time. She appears well-developed. No distress.  HENT:  Head: Normocephalic and atraumatic.  Cardiovascular: Normal rate, regular rhythm and normal heart sounds.   Pulmonary/Chest: Effort normal and breath sounds normal. No respiratory distress. She has no wheezes.  Abdominal: Soft. Normal appearance and bowel sounds are normal. There is no tenderness. There is no rigidity, no rebound and no guarding.  Musculoskeletal:       Cervical back: She exhibits decreased range of motion and tenderness (right trapezius muscle).       Right foot: There is tenderness.       Left foot: There is tenderness.  Neurological: She is alert and oriented to person, place, and time. She has normal reflexes.  Skin: Skin is warm and dry. No rash noted.  Psychiatric: She  has a normal mood and affect. Her behavior is normal. Thought content normal.  Nursing note and vitals reviewed.   BP 114/60   Pulse 89   Ht 5\' 6"  (1.676 m)   Wt 215 lb (97.5 kg)   LMP 06/26/2015 (Exact Date)   SpO2 98%   BMI 34.70 kg/m   Assessment and Plan: 1. Plantar fasciitis, bilateral - Ambulatory referral to Podiatry  2. Gastro-esophageal reflux disease without esophagitis Controlled with protonix  3. Constipation by delayed colonic transit Begin Linzess  4. Muscle spasm of right shoulder Begin muscle relaxants at night Continue heat and tylenol  5. Dependent edema Reduce sodium intake Elevate when possible  Meds ordered this encounter  Medications  . methocarbamol (ROBAXIN) 500 MG tablet    Sig: Take 1 tablet (500 mg total) by mouth every 8 (eight) hours as needed for muscle spasms.    Dispense:  60 tablet    Refill:  1  . pantoprazole (PROTONIX) 40 MG tablet    Sig: Take 1 tablet (40 mg total) by mouth daily.    Dispense:  90 tablet    Refill:  1  . linaclotide (LINZESS) 290 MCG CAPS capsule    Sig: Take 1 capsule (290 mcg total) by mouth daily before breakfast.    Dispense:  30 capsule    Refill:  [place], MD [facility]  08/05/2016

## 2016-11-19 ENCOUNTER — Other Ambulatory Visit: Payer: Self-pay | Admitting: Internal Medicine

## 2017-01-05 ENCOUNTER — Ambulatory Visit: Payer: BLUE CROSS/BLUE SHIELD | Admitting: Internal Medicine

## 2017-01-16 ENCOUNTER — Ambulatory Visit (INDEPENDENT_AMBULATORY_CARE_PROVIDER_SITE_OTHER): Payer: BLUE CROSS/BLUE SHIELD | Admitting: Internal Medicine

## 2017-01-16 ENCOUNTER — Encounter: Payer: Self-pay | Admitting: Internal Medicine

## 2017-01-16 ENCOUNTER — Ambulatory Visit
Admission: RE | Admit: 2017-01-16 | Discharge: 2017-01-16 | Disposition: A | Discharge status: Home / Self Care | Payer: 59 | Source: Ambulatory Visit | Attending: Internal Medicine | Admitting: Internal Medicine

## 2017-01-16 VITALS — BP 106/60 | HR 99 | Ht 66.0 in | Wt 220.0 lb

## 2017-01-16 DIAGNOSIS — E782 Mixed hyperlipidemia: Secondary | ICD-10-CM

## 2017-01-16 DIAGNOSIS — M25551 Pain in right hip: Secondary | ICD-10-CM | POA: Diagnosis not present

## 2017-01-16 DIAGNOSIS — K219 Gastro-esophageal reflux disease without esophagitis: Secondary | ICD-10-CM | POA: Diagnosis not present

## 2017-01-16 DIAGNOSIS — E349 Endocrine disorder, unspecified: Secondary | ICD-10-CM

## 2017-01-16 DIAGNOSIS — K5901 Slow transit constipation: Secondary | ICD-10-CM

## 2017-01-16 DIAGNOSIS — Z9889 Other specified postprocedural states: Secondary | ICD-10-CM | POA: Diagnosis not present

## 2017-01-16 MED ORDER — LINACLOTIDE 290 MCG PO CAPS: 290.0000 ug | ORAL_CAPSULE | Freq: Every day | ORAL | 2 refills | Status: DC

## 2017-01-16 NOTE — Patient Instructions (Addendum)
Linzess 290 mcg - one tablet in the AM before first meal of the day.  Take tylenol [phone] mg three times per day for 1-2 weeks  Apply heat to lateral thigh twice a day for 1-2 weeks

## 2017-01-16 NOTE — Progress Notes (Signed)
Date:  01/16/2017   Name:  Brittany Baldwin   DOB:  1970/06/23   MRN:  [id number]   Chief Complaint: Abdominal Pain (Having on and off pains. Feels like pulling in stomach. Had a hernia in past and wondering if running into the samething. ); Leg Pain (Pain from hip to knee. Was told by chiropracter her pelvis is tilted. Didn't continue with chiro. ); and Constipation (Taking Senakot - wants to discuss other meds for constipation. Takes 2 in the morning and 2 in the evening. )  Abdominal Pain  This is a recurrent problem. The problem occurs intermittently. The pain is located in the epigastric region. The pain is moderate. The quality of the pain is cramping. Associated symptoms include arthralgias and constipation. Pertinent negatives include no fever, headaches, melena, myalgias, nausea, vomiting or weight loss. The pain is aggravated by certain positions and movement. The pain is relieved by nothing.  Leg Pain   There was no injury mechanism. The pain is present in the right leg. The quality of the pain is described as aching. The pain is moderate. The pain has been intermittent since onset. The symptoms are aggravated by movement. She has tried nothing for the symptoms.  Constipation  This is a new problem. The problem has been gradually improving since onset. Associated symptoms include abdominal pain. Pertinent negatives include no fever, melena, nausea, vomiting or weight loss. She has tried laxatives for the symptoms.  Hyperlipidemia  This is a new problem. Associated symptoms include leg pain. Pertinent negatives include no chest pain, myalgias or shortness of breath. Current antihyperlipidemic treatment includes statins (needs labs to recheck).  Elevated PTH - noted 6 months ago with normal calcium.  Due for recheck now.  Patient has ongoing constipation. She was prescribed lens S last year but couldn't fill it because it was $200. She's never tried anything else other than over-the-counter  Senokot. She's taking 4 tablets a day and does have regular bowel movements. She is wondering about something that might be more effective. She also has general numbness in lower abdomen following her pelvic surgery. The skin is numb and at times it feels as if there is twisting or pulling sensation. The GYN surgeon had told this was likely scar tissue and probably could not be further treated. She is concerned that her NIssan fundoplication may no longer be effective. Although she still has trouble swallowing. Especially meat and bread seems to stick in her esophagus. She has not had reflux symptoms, she is familiar with what that is like. Not currently on any proton pump inhibitor or Zantac.  Review of Systems  Constitutional: Negative for chills, fatigue, fever, unexpected weight change and weight loss.  Respiratory: Negative for chest tightness, shortness of breath and wheezing.   Cardiovascular: Positive for palpitations (occasional tachycardia). Negative for chest pain. Leg swelling: mild ankle edema.  Gastrointestinal: Positive for abdominal pain and constipation. Negative for blood in stool, melena, nausea and vomiting.  Musculoskeletal: Positive for arthralgias. Negative for gait problem, joint swelling and myalgias.  Neurological: Negative for dizziness and headaches.  Psychiatric/Behavioral: Negative for dysphoric mood and sleep disturbance.    Patient Active Problem List   Diagnosis Date Noted  . Muscle spasm of right shoulder 08/05/2016  . Dependent edema 08/05/2016  . Plantar fasciitis, bilateral 08/05/2016  . 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 Elevated parathyroid hormone 07/03/2016  . 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 Status post abdominal hysterectomy 07/02/2015  . History of  pulmonary embolus during pregnancy 05/16/2015  . Tachycardia 05/10/2015  . History of fundoplication 88/28/0034  . Gastro-esophageal reflux disease without esophagitis 03/28/2014  .  Hyperlipidemia, mixed 03/28/2014    Prior to Admission medications   Medication Sig Start Date End Date Taking? Authorizing Provider  atorvastatin (LIPITOR) 10 MG tablet Take 1 tablet (10 mg total) by mouth daily at 6 PM. 07/03/16  Yes Glean Hess, MD  Biotin 1 MG CAPS Take by mouth.   Yes [provider]  propranolol (INDERAL) 20 MG tablet TK 1 T PO PRN 11/14/14  Yes [provider]  propranolol ER (INDERAL LA) 80 MG 24 hr capsule TAKE ONE CAPSULE BY MOUTH TWICE DAILY 11/19/16  Yes Glean Hess, MD  senna (SENOKOT) 8.6 MG tablet Take 1 tablet by mouth daily.   Yes [provider]    Allergies  Allergen Reactions  . Codeine Shortness Of Breath  . Hydrocodone-Acetaminophen Rash  . Aspirin Other (See Comments)    Stomach issues  . Diphenhydramine Hcl Other (See Comments)    Through an I.V. Drip in addition to other meds  . Adhesive [Tape] Rash    ELECTRODES FROM HOLTER MONITOR CAUSED RASH    Past Surgical History:  Procedure Laterality Date  . ABDOMINAL HYSTERECTOMY Bilateral 07/02/2015   Procedure: HYSTERECTOMY ABDOMINAL WITH BS;  Surgeon: Brayton Mars, MD;  Location: [facility] ORS;  Service: Gynecology;  Laterality: Bilateral;  . APPENDECTOMY    . CARDIAC ELECTROPHYSIOLOGY STUDY AND ABLATION    . CHOLECYSTECTOMY    . COLONOSCOPY  2013  . CYSTOSCOPY Bilateral 07/02/2015   Procedure: CYSTOSCOPY;  Surgeon: Brayton Mars, MD;  Location: [facility] ORS;  Service: Gynecology;  Laterality: Bilateral;  . LAPAROTOMY N/A 07/11/2015   Procedure: hematoma evacuation;  Surgeon: Brayton Mars, MD;  Location: [facility] ORS;  Service: Gynecology;  Laterality: N/A;  . NISSEN FUNDOPLICATION    . TUBAL LIGATION      Social History  Substance Use Topics  . Smoking status: Never Smoker  . Smokeless tobacco: Never Used  . Alcohol use No     Medication list has been reviewed and updated.   Physical Exam  Constitutional: She is oriented to person,  place, and time. She appears well-developed. No distress.  HENT:  Head: Normocephalic and atraumatic.  Neck: Normal range of motion. Neck supple. No thyromegaly present.  Cardiovascular: Normal rate, regular rhythm and normal heart sounds.   Pulmonary/Chest: Effort normal and breath sounds normal. No respiratory distress.  Abdominal: Soft. Normal appearance. Bowel sounds are decreased. There is generalized tenderness. There is no CVA tenderness.  Musculoskeletal: She exhibits edema.       Right hip: She exhibits decreased range of motion. She exhibits normal strength and no tenderness.       Lumbar back: She exhibits normal range of motion, no tenderness and no spasm.  Neurological: She is alert and oriented to person, place, and time.  Skin: Skin is warm and dry. No rash noted.  Psychiatric: She has a normal mood and affect. Her behavior is normal. Thought content normal.  Nursing note and vitals reviewed.   BP 106/60   Pulse 99   Ht 5\' 6"  (1.676 m)   Wt 220 lb (99.8 kg)   LMP 06/26/2015 (Exact Date)   SpO2 99%   BMI 35.51 kg/m   Assessment and Plan: 1. Hyperlipidemia, mixed Now on statin therapy - Comprehensive metabolic panel - Lipid panel  2. Elevated parathyroid hormone recheck - PTH,  Intact and Calcium  3. Gastro-esophageal reflux disease without esophagitis - Ambulatory referral to Gastroenterology  4. History of fundoplication Refer to GI  5. Constipation by delayed colonic transit Sample of Linzess and savings card given - linaclotide (LINZESS) 290 MCG CAPS capsule; Take 1 capsule (290 mcg total) by mouth daily before breakfast.  Dispense: 30 capsule; Refill: 2 - Ambulatory referral to Gastroenterology  6. Pain of right hip joint Begin tylenol and heat - DG HIP UNILAT WITH PELVIS MIN 4 VIEWS RIGHT; Future   Meds ordered this encounter  Medications  . linaclotide (LINZESS) 290 MCG CAPS capsule    Sig: Take 1 capsule (290 mcg total) by mouth daily before  breakfast.    Dispense:  30 capsule    Refill:  2    Halina Maidens, MD [facility]  01/16/2017

## 2017-01-17 LAB — LIPID PANEL
CHOL/HDL RATIO: 3.8 ratio (ref 0.0–4.4)
CHOLESTEROL TOTAL: 164 mg/dL (ref 100–199)
HDL: 43 mg/dL (ref >39)
LDL CALC: 89 mg/dL (ref 0–99)
Triglycerides: 160 mg/dL — ABNORMAL HIGH (ref 0–149)
VLDL CHOLESTEROL CAL: 32 mg/dL (ref 5–40)

## 2017-01-17 LAB — COMPREHENSIVE METABOLIC PANEL
ALK PHOS: 57 IU/L (ref 39–117)
ALT: 17 IU/L (ref 0–32)
AST: 20 IU/L (ref 0–40)
Albumin/Globulin Ratio: 2.1 (ref 1.2–2.2)
Albumin: 4.5 g/dL (ref 3.5–5.5)
BILIRUBIN TOTAL: 0.5 mg/dL (ref 0.0–1.2)
BUN/Creatinine Ratio: 8 — ABNORMAL LOW (ref 9–23)
BUN: 7 mg/dL (ref 6–24)
CHLORIDE: 106 mmol/L (ref 96–106)
CO2: 24 mmol/L (ref 20–29)
Calcium: 10.5 mg/dL — ABNORMAL HIGH (ref 8.7–10.2)
Creatinine, Ser: 0.9 mg/dL (ref 0.57–1.00)
GFR calc non Af Amer: 76 mL/min/{1.73_m2} (ref >59)
GFR, EST AFRICAN AMERICAN: 88 mL/min/{1.73_m2} (ref >59)
GLUCOSE: 106 mg/dL — AB (ref 65–99)
Globulin, Total: 2.1 g/dL (ref 1.5–4.5)
Potassium: 3.8 mmol/L (ref 3.5–5.2)
Sodium: 142 mmol/L (ref 134–144)
TOTAL PROTEIN: 6.6 g/dL (ref 6.0–8.5)

## 2017-01-17 LAB — PTH, INTACT AND CALCIUM: PTH: 27 pg/mL (ref 15–65)

## 2017-01-18 ENCOUNTER — Other Ambulatory Visit: Payer: Self-pay | Admitting: Internal Medicine

## 2017-01-18 ENCOUNTER — Encounter: Payer: Self-pay | Admitting: Internal Medicine

## 2017-01-18 DIAGNOSIS — E349 Endocrine disorder, unspecified: Secondary | ICD-10-CM

## 2017-01-18 HISTORY — DX: Hypercalcemia: E83.52

## 2017-01-19 ENCOUNTER — Other Ambulatory Visit: Payer: Self-pay

## 2017-01-19 DIAGNOSIS — E349 Endocrine disorder, unspecified: Secondary | ICD-10-CM

## 2017-01-19 NOTE — Telephone Encounter (Signed)
Patient MyChart message. Please Advise.

## 2017-01-23 ENCOUNTER — Other Ambulatory Visit: Payer: Self-pay | Admitting: Internal Medicine

## 2017-01-23 ENCOUNTER — Other Ambulatory Visit: Payer: Self-pay

## 2017-01-23 LAB — PTH-RELATED PEPTIDE: PTH-related peptide: <2 pmol/L

## 2017-01-23 LAB — VITAMIN D 25 HYDROXY (VIT D DEFICIENCY, FRACTURES): Vit D, 25-Hydroxy: 35.6 ng/mL (ref 30.0–100.0)

## 2017-01-23 LAB — VITAMIN D 1,25 DIHYDROXY
Vitamin D 1, 25 (OH)2 Total: 60 pg/mL
Vitamin D2 1, 25 (OH)2: <10 pg/mL
Vitamin D3 1, 25 (OH)2: 60 pg/mL

## 2017-01-27 ENCOUNTER — Other Ambulatory Visit: Payer: Self-pay | Admitting: Internal Medicine

## 2017-01-27 LAB — PROTEIN ELECTROPHORESIS, SERUM
A/G Ratio: 1.4 (ref 0.7–1.7)
ALPHA 1: 0.2 g/dL (ref 0.0–0.4)
Albumin ELP: 4.2 g/dL (ref 2.9–4.4)
Alpha 2: 0.5 g/dL (ref 0.4–1.0)
Beta: 1.2 g/dL (ref 0.7–1.3)
GAMMA GLOBULIN: 1.1 g/dL (ref 0.4–1.8)
GLOBULIN, TOTAL: 2.9 g/dL (ref 2.2–3.9)
TOTAL PROTEIN: 7.1 g/dL (ref 6.0–8.5)

## 2017-01-27 LAB — PROTEIN ELECTROPHORESIS, URINE REFLEX
Albumin ELP, Urine: 23 %
Alpha-1-Globulin, U: 5.8 %
Alpha-2-Globulin, U: 16.9 %
BETA GLOBULIN, U: 34.5 %
Gamma Globulin, U: 19.8 %
PROTEIN UR: 30.9 mg/dL

## 2017-01-27 LAB — THYROID PANEL WITH TSH
Free Thyroxine Index: 2.5 (ref 1.2–4.9)
T3 Uptake Ratio: 28 % (ref 24–39)
T4, Total: 8.8 ug/dL (ref 4.5–12.0)
TSH: 1.6 u[IU]/mL (ref 0.450–4.500)

## 2017-01-27 LAB — KAPPA/LAMBDA LIGHT CHAINS
IG KAPPA FREE LIGHT CHAIN: 9.8 mg/L (ref 3.3–19.4)
IG LAMBDA FREE LIGHT CHAIN: 12 mg/L (ref 5.7–26.3)
Kappa/Lambda FluidC Ratio: 0.82 (ref 0.26–1.65)

## 2017-01-27 NOTE — Progress Notes (Signed)
Thyroid tests are normal.  There is no evidence of multiple myeloma.  I do not know why the calcium is elevated but I have not found any reason for concern.  I would like to recheck the calcium levels in about one month.

## 2017-02-20 ENCOUNTER — Other Ambulatory Visit: Payer: Self-pay | Admitting: Gastroenterology

## 2017-02-20 ENCOUNTER — Ambulatory Visit (INDEPENDENT_AMBULATORY_CARE_PROVIDER_SITE_OTHER): Payer: 59 | Admitting: Gastroenterology

## 2017-02-20 ENCOUNTER — Other Ambulatory Visit: Payer: Self-pay

## 2017-02-20 ENCOUNTER — Encounter: Payer: Self-pay | Admitting: Gastroenterology

## 2017-02-20 VITALS — BP 117/76 | HR 79 | Temp 98.1°F | Wt 216.8 lb

## 2017-02-20 DIAGNOSIS — R140 Abdominal distension (gaseous): Secondary | ICD-10-CM

## 2017-02-20 DIAGNOSIS — R1030 Lower abdominal pain, unspecified: Secondary | ICD-10-CM | POA: Diagnosis not present

## 2017-02-20 DIAGNOSIS — K5904 Chronic idiopathic constipation: Secondary | ICD-10-CM

## 2017-02-20 DIAGNOSIS — K30 Functional dyspepsia: Secondary | ICD-10-CM

## 2017-02-20 DIAGNOSIS — R1013 Epigastric pain: Secondary | ICD-10-CM

## 2017-02-20 DIAGNOSIS — K5900 Constipation, unspecified: Secondary | ICD-10-CM

## 2017-02-20 DIAGNOSIS — R1310 Dysphagia, unspecified: Secondary | ICD-10-CM

## 2017-02-20 MED ORDER — PEG 3350-KCL-NA BICARB-NACL 420 G PO SOLR: 4000.0000 mL | Freq: Once | ORAL | 0 refills | Status: AC

## 2017-02-20 MED ORDER — OMEPRAZOLE 40 MG PO CPDR: 40.0000 mg | DELAYED_RELEASE_CAPSULE | Freq: Every day | ORAL | 0 refills | Status: DC

## 2017-02-20 NOTE — Progress Notes (Signed)
Cephas Darby, MD [address]  [place]  [place], [place] [address]  Main: [phone]  Fax: [phone]    Gastroenterology Consultation  Referring Provider:     Glean Hess, MD Primary Care Physician:  Glean Hess, MD Primary Gastroenterologist:  None Reason for Consultation:  Epigastric pain, difficulty swallowing, chronic constipation        HPI:   Brittany Baldwin is a 46 y.o. y/oAfrican-American female referred by Dr. Army Melia, Jesse Sans, MD  for consultation & management of chronic dysphagia, chronic constipation, epigastric pain  Chronic dysphagia: She had a Nissen's fundoplication done approximately in year 2012 or 2013 in [place]. She reports that since the surgery she has been having difficulty swallowing solids, sensation of food stuck in the throat. She had a esophageal manometry performed and it was normal. She had a pH study which was also negative for acid reflux. She is gaining weight. She is currently not on PPI. She did not have empiric dilation for symptomatic dysphagia.   Dyspepsia: This is started recently. Has been experiencing epigastric pain, sharp worse after eating especially high-calorie foods, early satiety, associated with nausea and bloating. She had an EGD with gastric biopsies performed in 06/2014. Negative for H. pylori. She has not tried any medications for dyspepsia. Status post cholecystectomy. No recent abdominal imaging. As not have anemia. LFTs are normal  Chronic constipation: For several years. She tried linaclotide 90 g but not effective. Take senna 4 tablets which helps to have bowel movement one to 2 daily. She could not tolerate MiraLAX due to the taste. She ran out of senna. She had a small hard stool today, prior to this, it was 2 or 3 days ago. He also reports diffuse abdominal pain. Her last colonoscopy was about 10 years ago. She denies any rectal bleeding, hematochezia.  She denies family history of GI malignancy She works  at a front desk He does not smoke or drink alcohol She is on propranolol for benign tachycardia  GI Procedures:  EGD on 06/05/2014 A. GASTRIC ANTRUM (ENDOSCOPIC BIOPSY):  GASTRIC ANTRAL MUCOSA WITH REACTIVE FOVEOLAR HYPERPLASIA AND MILD CHRONIC GASTRITIS. NO ACTIVE GASTRITIS IS SEEN. IMMUNOHISTOCHEMISTRY FOR HELICOBACTER PYLORI IS NEGATIVE. B. ESOPHAGUS AT GASTROESOPHAGEAL JUNCTION (ENDOSCOPIC BIOPSY):  SQUAMOUS MUCOSA WITH NO PATHOLOGIC ABNORMALITY. NO EVIDENCE OF REFLUX OR EOSINOPHILIC ESOPHAGITIS IS SEEN. NEGATIVE FOR INTESTINAL METAPLASIA, DYSPLASIA OR MALIGNANCY. ----------------------------------------------------------------------------------------------------------- pH Bravo Testing Results01/12/2014 [facility]  Results of 48 hour pH monitoring by way of an endoscopically  placed pH Bravo probe revealed: - NO Significant presence of acid as evidenced by pH lower than 4  throughout the monitoring period (DeMeester score of 1.4 & 2.1 on  days 1 & 2 respectively) - Given the above and recent workup demonstrating intact Nissen  Fundoplication, No additional reflux testing/interventions  Recommended. ------------------------------------------------------------------------------------------------ NM gastric emptying study 12/22/2014  Findings : The stomach is located normally in the left upper abdomen.  Solid gastric emptying at 60 minutes is 48% (normal range at 60 minutes is >10%). Solid gastric emptying at 120 minutes is 69% (normal range at 120 minutes is >40%). Solid gastric emptying at 244 minutes is 97% (normal range at 240 minutes is >90%).   Past Medical History:  Diagnosis Date  . Anemia    H/O   . Anxiety   . Dysrhythmia    SVT-HAD CARDIAC ABLATION DONE IN 2016 BUT PT STATES SHE STILL GETS INTERMITTENT TACHYCARDIC AND IS SYPMPTOMATIC WITH SOB, DIZZINESS DURING TACHY EPISODES (06-25-15)  .  Fibroid   . GERD (gastroesophageal reflux disease)   . Headache     H/O  . Heart murmur    as child  . Hyperlipemia   . Shortness of breath dyspnea    ASSOCIATED WITH TACHYCARDIA  . Tachycardia     Past Surgical History:  Procedure Laterality Date  . ABDOMINAL HYSTERECTOMY Bilateral 07/02/2015   Procedure: HYSTERECTOMY ABDOMINAL WITH BS;  Surgeon: Brayton Mars, MD;  Location: [facility] ORS;  Service: Gynecology;  Laterality: Bilateral;  . APPENDECTOMY    . CARDIAC ELECTROPHYSIOLOGY STUDY AND ABLATION    . CHOLECYSTECTOMY    . COLONOSCOPY  2013  . CYSTOSCOPY Bilateral 07/02/2015   Procedure: CYSTOSCOPY;  Surgeon: Brayton Mars, MD;  Location: [facility] ORS;  Service: Gynecology;  Laterality: Bilateral;  . LAPAROTOMY N/A 07/11/2015   Procedure: hematoma evacuation;  Surgeon: Brayton Mars, MD;  Location: [facility] ORS;  Service: Gynecology;  Laterality: N/A;  . NISSEN FUNDOPLICATION    . TUBAL LIGATION      Prior to Admission medications   Medication Sig Start Date End Date Taking? Authorizing Provider  atorvastatin (LIPITOR) 10 MG tablet Take 1 tablet (10 mg total) by mouth daily at 6 PM. 07/03/16  Yes Glean Hess, MD  Biotin 1 MG CAPS Take by mouth.   Yes [provider]  propranolol (INDERAL) 20 MG tablet TK 1 T PO PRN 11/14/14  Yes [provider]  propranolol ER (INDERAL LA) 80 MG 24 hr capsule TAKE ONE CAPSULE BY MOUTH TWICE DAILY 11/19/16  Yes Glean Hess, MD  senna (SENOKOT) 8.6 MG tablet Take 1 tablet by mouth daily.   Yes [provider]    Family History  Problem Relation Age of Onset  . Diabetes Mother   . Prostate cancer Father   . Congestive Heart Failure Father   . Heart failure Father   . Cancer Neg Hx      Social History  Substance Use Topics  . Smoking status: Never Smoker  . Smokeless tobacco: Never Used  . Alcohol use No    Allergies as of 02/20/2017 - Review Complete 02/20/2017  Allergen Reaction Noted  . Codeine Shortness Of Breath 05/09/2015  . Hydrocodone-acetaminophen  Rash 05/09/2015  . Aspirin Other (See Comments) 05/09/2015  . Diphenhydramine hcl Other (See Comments) 05/09/2015  . Adhesive [tape] Rash 06/25/2015    Review of Systems:    All systems reviewed and negative except where noted in HPI.   Physical Exam:  BP 117/76   Pulse 79   Temp 98.1 F (36.7 C) (Oral)   Wt 216 lb 12.8 oz (98.3 kg)   LMP 06/26/2015 (Exact Date)   BMI 34.99 kg/m  Patient's last menstrual period was 06/26/2015 (exact date).  General:   Alert,  Well-developed, well-nourished, pleasant and cooperative in NAD Head:  Normocephalic and atraumatic. Eyes:  Sclera clear, no icterus.   Conjunctiva pink. Ears:  Normal auditory acuity. Nose:  No deformity, discharge, or lesions. Mouth:  No deformity or lesions,oropharynx pink & moist. Neck:  Supple; no masses or thyromegaly. Lungs:  Respirations even and unlabored.  Clear throughout to auscultation.   No wheezes, crackles, or rhonchi. No acute distress. Heart:  Regular rate and rhythm; no murmurs, clicks, rubs, or gallops. Abdomen:  Normal bowel sounds.  No bruits.  Soft, Mild epigastric tenderness, diffuse abdominal tenderness and non-distended without masses, hepatosplenomegaly or hernias noted.  No guarding or rebound tenderness.   Rectal: Nor performed Msk:  Symmetrical without  gross deformities. Good, equal movement & strength bilaterally. Pulses:  Normal pulses noted. Extremities:  No clubbing or edema.  No cyanosis. Neurologic:  Alert and oriented x3;  grossly normal neurologically. Skin:  Intact without significant lesions or rashes. No jaundice. Lymph Nodes:  No significant cervical adenopathy. Psych:  Alert and cooperative. Normal mood and affect.  Imaging Studies: Recent abdominal imaging  Assessment and Plan:   Brittany Baldwin is a 46 y.o. African American female with history of refractory GERD status post Nissen's fundoplication presents with chronic epigastric pain, chronic constipation, chronic  dysphagia  Chronic dysphagia: Targeted since surgery. Had extensive workup done. Manometry normal. PH study normal. EGD revealed normal wrap in the past. 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 She would like to get the EGD done for possible empiric dilation. 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 I will try Prilosec 40 mg daily for 8 weeks. I also recommended trial of tricyclic antidepressant but patient was hesitant to try. Recommend desipramine. She said she will think about it.  Chronic constipation: Idiopathic or slow transit. Associated with diffuse abdominal pain and bloating. Dependent on laxatives. Recommend GoLYTELY to clean out first followed by trial of linaclotide 290 g daily in the morning. If this doesn't help, I will try plecanatide.  Diffuse abdominal pain: Most likely functional associated with chronic constipation. But, I would like to get a CT A/P prior to colonoscopy to rule out any other intra-abdominal pathology  Colon cancer screening: She is due for one. Schedule colonoscopy  I have discussed alternative options, risks & benefits,  which include, but are not limited to, bleeding, infection, perforation,respiratory complication & drug reaction.  The patient agrees with this plan & written consent will be obtained.     Follow up in 2 months   Cephas Darby, MD

## 2017-02-20 NOTE — Patient Instructions (Signed)
1. Start prilosec 40mg  daily 72min before breakfast for epigastric pain 2. Take golytely to clean out, then start linzess 272mcg daily in the morning  3. Schedule EGD and colonoscopy   Please call our office to speak with my nurse Driscilla Grammes at [phone] during business hours from 8am to 4pm if you have any questions/concerns. During after hours, you will be redirected to on call GI physician. For any emergency please call 911 or go the nearest emergency room.    Cephas Darby, MD [address]  [place]  [place], [place] [address]  Main: [phone]  Fax: [phone]

## 2017-02-23 ENCOUNTER — Other Ambulatory Visit: Payer: Self-pay | Admitting: Gastroenterology

## 2017-02-23 DIAGNOSIS — R1030 Lower abdominal pain, unspecified: Secondary | ICD-10-CM

## 2017-02-24 ENCOUNTER — Other Ambulatory Visit: Payer: Self-pay | Admitting: Internal Medicine

## 2017-02-24 NOTE — Telephone Encounter (Signed)
Need refill sent to [facility] in [place], pt ran out last night and has not taken any today  propranolol ER (INDERAL LA) 80 MG 24 hr capsule [[id number]]

## 2017-02-25 NOTE — Telephone Encounter (Signed)
Medication sent.  Thanks.

## 2017-02-27 ENCOUNTER — Other Ambulatory Visit: Payer: Self-pay

## 2017-02-27 ENCOUNTER — Ambulatory Visit: Admission: RE | Admit: 2017-02-27 | Payer: 59 | Source: Ambulatory Visit

## 2017-02-28 LAB — CALCIUM: CALCIUM: 9.7 mg/dL (ref 8.7–10.2)

## 2017-03-02 ENCOUNTER — Encounter: Payer: Self-pay | Admitting: Internal Medicine

## 2017-03-03 ENCOUNTER — Encounter: Payer: Self-pay | Admitting: *Deleted

## 2017-03-05 NOTE — Discharge Instructions (Signed)
General Anesthesia, Adult, Care After °These instructions provide you with information about caring for yourself after your procedure. Your health care provider may also give you more specific instructions. Your treatment has been planned according to current medical practices, but problems sometimes occur. Call your health care provider if you have any problems or questions after your procedure. °What can I expect after the procedure? °After the procedure, it is common to have: °· Vomiting. °· A sore throat. °· Mental slowness. ° °It is common to feel: °· Nauseous. °· Cold or shivery. °· Sleepy. °· Tired. °· Sore or achy, even in parts of your body where you did not have surgery. ° °Follow these instructions at home: °For at least 24 hours after the procedure: °· Do not: °? Participate in activities where you could fall or become injured. °? Drive. °? Use heavy machinery. °? Drink alcohol. °? Take sleeping pills or medicines that cause drowsiness. °? Make important decisions or sign legal documents. °? Take care of children on your own. °· Rest. °Eating and drinking °· If you vomit, drink water, juice, or soup when you can drink without vomiting. °· Drink enough fluid to keep your urine clear or pale yellow. °· Make sure you have little or no nausea before eating solid foods. °· Follow the diet recommended by your health care provider. °General instructions °· Have a responsible adult stay with you until you are awake and alert. °· Return to your normal activities as told by your health care provider. Ask your health care provider what activities are safe for you. °· Take over-the-counter and prescription medicines only as told by your health care provider. °· If you smoke, do not smoke without supervision. °· Keep all follow-up visits as told by your health care provider. This is important. °Contact a health care provider if: °· You continue to have nausea or vomiting at home, and medicines are not helpful. °· You  cannot drink fluids or start eating again. °· You cannot urinate after 8-12 hours. °· You develop a skin rash. °· You have fever. °· You have increasing redness at the site of your procedure. °Get help right away if: °· You have difficulty breathing. °· You have chest pain. °· You have unexpected bleeding. °· You feel that you are having a life-threatening or urgent problem. °This information is not intended to replace advice given to you by your health care provider. Make sure you discuss any questions you have with your health care provider. °Document Released: 08/25/2000 Document Revised: 10/22/2015 Document Reviewed: 05/03/2015 °Elsevier Interactive Patient Education © 2018 [employer] ° °

## 2017-03-10 ENCOUNTER — Ambulatory Visit: Payer: 59 | Admitting: Anesthesiology

## 2017-03-10 ENCOUNTER — Telehealth: Payer: Self-pay

## 2017-03-10 ENCOUNTER — Encounter
Admission: RE | Disposition: A | Discharge status: Home / Self Care | Payer: Self-pay | Source: Ambulatory Visit | Attending: Gastroenterology

## 2017-03-10 ENCOUNTER — Ambulatory Visit
Admission: RE | Admit: 2017-03-10 | Discharge: 2017-03-10 | Disposition: A | Discharge status: Home / Self Care | Payer: 59 | Source: Ambulatory Visit | Attending: Gastroenterology | Admitting: Gastroenterology

## 2017-03-10 DIAGNOSIS — D649 Anemia, unspecified: Secondary | ICD-10-CM | POA: Diagnosis not present

## 2017-03-10 DIAGNOSIS — R1319 Other dysphagia: Secondary | ICD-10-CM

## 2017-03-10 DIAGNOSIS — R1314 Dysphagia, pharyngoesophageal phase: Secondary | ICD-10-CM | POA: Insufficient documentation

## 2017-03-10 DIAGNOSIS — K648 Other hemorrhoids: Secondary | ICD-10-CM | POA: Diagnosis not present

## 2017-03-10 DIAGNOSIS — Z79899 Other long term (current) drug therapy: Secondary | ICD-10-CM | POA: Insufficient documentation

## 2017-03-10 DIAGNOSIS — K219 Gastro-esophageal reflux disease without esophagitis: Secondary | ICD-10-CM | POA: Diagnosis not present

## 2017-03-10 DIAGNOSIS — I471 Supraventricular tachycardia: Secondary | ICD-10-CM | POA: Diagnosis not present

## 2017-03-10 DIAGNOSIS — Z1211 Encounter for screening for malignant neoplasm of colon: Secondary | ICD-10-CM | POA: Diagnosis present

## 2017-03-10 DIAGNOSIS — E785 Hyperlipidemia, unspecified: Secondary | ICD-10-CM | POA: Insufficient documentation

## 2017-03-10 DIAGNOSIS — Z86711 Personal history of pulmonary embolism: Secondary | ICD-10-CM | POA: Diagnosis not present

## 2017-03-10 DIAGNOSIS — R1310 Dysphagia, unspecified: Secondary | ICD-10-CM

## 2017-03-10 DIAGNOSIS — E669 Obesity, unspecified: Secondary | ICD-10-CM | POA: Diagnosis not present

## 2017-03-10 DIAGNOSIS — R140 Abdominal distension (gaseous): Secondary | ICD-10-CM

## 2017-03-10 DIAGNOSIS — F419 Anxiety disorder, unspecified: Secondary | ICD-10-CM | POA: Diagnosis not present

## 2017-03-10 DIAGNOSIS — K5900 Constipation, unspecified: Secondary | ICD-10-CM

## 2017-03-10 DIAGNOSIS — Z6835 Body mass index (BMI) 35.0-35.9, adult: Secondary | ICD-10-CM | POA: Diagnosis not present

## 2017-03-10 DIAGNOSIS — R1013 Epigastric pain: Secondary | ICD-10-CM

## 2017-03-10 HISTORY — PX: ESOPHAGOGASTRODUODENOSCOPY: SHX5428

## 2017-03-10 HISTORY — PX: ESOPHAGEAL DILATION: SHX303

## 2017-03-10 HISTORY — PX: COLONOSCOPY WITH PROPOFOL: SHX5780

## 2017-03-10 HISTORY — DX: Motion sickness, initial encounter: T75.3XXA

## 2017-03-10 HISTORY — DX: Nausea with vomiting, unspecified: R11.2

## 2017-03-10 HISTORY — DX: Other pulmonary embolism without acute cor pulmonale: I26.99

## 2017-03-10 HISTORY — DX: Other specified postprocedural states: Z98.890

## 2017-03-10 SURGERY — COLONOSCOPY WITH PROPOFOL
Anesthesia: General | Wound class: Dirty or Infected

## 2017-03-10 MED ORDER — ONDANSETRON HCL 4 MG/2ML IJ SOLN: 4.0000 mg | Freq: Once | INTRAMUSCULAR | Status: DC | PRN

## 2017-03-10 MED ORDER — LACTATED RINGERS IV SOLN
10.0000 mL/h | INTRAVENOUS | Status: DC
  Administered 2017-03-10: 10 mL/h via INTRAVENOUS
  Administered 2017-03-10: 11:00:00 via INTRAVENOUS

## 2017-03-10 MED ORDER — SODIUM CHLORIDE 0.9 % IV SOLN: INTRAVENOUS | Status: DC

## 2017-03-10 MED ORDER — LIDOCAINE HCL (CARDIAC) 20 MG/ML IV SOLN
INTRAVENOUS | Status: DC | PRN
  Administered 2017-03-10: 40 mg via INTRAVENOUS

## 2017-03-10 MED ORDER — GLYCOPYRROLATE 0.2 MG/ML IJ SOLN
INTRAMUSCULAR | Status: DC | PRN
  Administered 2017-03-10: 0.2 mg via INTRAVENOUS

## 2017-03-10 MED ORDER — PROPOFOL 10 MG/ML IV BOLUS
INTRAVENOUS | Status: DC | PRN
  Administered 2017-03-10 (×3): 20 mg via INTRAVENOUS
  Administered 2017-03-10: 30 mg via INTRAVENOUS
  Administered 2017-03-10: 20 mg via INTRAVENOUS
  Administered 2017-03-10: 30 mg via INTRAVENOUS
  Administered 2017-03-10 (×4): 20 mg via INTRAVENOUS
  Administered 2017-03-10: 30 mg via INTRAVENOUS
  Administered 2017-03-10: 80 mg via INTRAVENOUS
  Administered 2017-03-10: 30 mg via INTRAVENOUS
  Administered 2017-03-10 (×6): 20 mg via INTRAVENOUS
  Administered 2017-03-10 (×2): 30 mg via INTRAVENOUS
  Administered 2017-03-10: 20 mg via INTRAVENOUS
  Administered 2017-03-10: 30 mg via INTRAVENOUS

## 2017-03-10 SURGICAL SUPPLY — 26 items
BALLN DILATOR 15-18 8 (BALLOONS) ×4
BALLOON DILATOR 15-18 8 (BALLOONS) ×2 IMPLANT
CANISTER SUCT 1200ML W/VALVE (MISCELLANEOUS) ×4 IMPLANT
CLIP HMST 235XBRD CATH ROT (MISCELLANEOUS) IMPLANT
CLIP RESOLUTION 360 11X235 (MISCELLANEOUS)
FCP ESCP3.2XJMB 240X2.8X (MISCELLANEOUS)
FORCEPS BIOP RAD 4 LRG CAP 4 (CUTTING FORCEPS) IMPLANT
FORCEPS BIOP RJ4 240 W/NDL (MISCELLANEOUS)
FORCEPS ESCP3.2XJMB 240X2.8X (MISCELLANEOUS) IMPLANT
GOWN CVR UNV OPN BCK APRN NK (MISCELLANEOUS) ×4 IMPLANT
GOWN ISOL THUMB LOOP REG UNIV (MISCELLANEOUS) ×4
INJECTOR VARIJECT VIN23 (MISCELLANEOUS) IMPLANT
KIT DEFENDO VALVE AND CONN (KITS) IMPLANT
KIT ENDO PROCEDURE OLY (KITS) ×4 IMPLANT
MARKER SPOT ENDO TATTOO 5ML (MISCELLANEOUS) IMPLANT
PAD GROUND ADULT SPLIT (MISCELLANEOUS) IMPLANT
PROBE APC STR FIRE (PROBE) IMPLANT
RETRIEVER NET ROTH 2.5X230 LF (MISCELLANEOUS) IMPLANT
SNARE SHORT THROW 13M SML OVAL (MISCELLANEOUS) IMPLANT
SNARE SHORT THROW 30M LRG OVAL (MISCELLANEOUS) IMPLANT
SNARE SNG USE RND 15MM (INSTRUMENTS) IMPLANT
SPOT EX ENDOSCOPIC TATTOO (MISCELLANEOUS)
SYR INFLATION 60ML (SYRINGE) ×4 IMPLANT
TRAP ETRAP POLY (MISCELLANEOUS) IMPLANT
VARIJECT INJECTOR VIN23 (MISCELLANEOUS)
WATER STERILE IRR 250ML POUR (IV SOLUTION) ×4 IMPLANT

## 2017-03-10 NOTE — Anesthesia Preprocedure Evaluation (Addendum)
Anesthesia Evaluation  Patient identified by MRN, date of birth, ID band Patient awake    Reviewed: Allergy & Precautions, NPO status , Patient's Chart, lab work & pertinent test results, reviewed documented beta blocker date and time   History of Anesthesia Complications (+) PONV and history of anesthetic complications  Airway Mallampati: II  TM Distance: >3 FB Neck ROM: Full    Dental no notable dental hx.    Pulmonary shortness of breath (wth palpitations),    Pulmonary exam normal breath sounds clear to auscultation       Cardiovascular Normal cardiovascular exam+ dysrhythmias (SVT s/p ablation 2016)  Rhythm:Regular Rate:Normal     Neuro/Psych  Headaches, PSYCHIATRIC DISORDERS Anxiety    GI/Hepatic Neg liver ROS, GERD  ,  Endo/Other  negative endocrine ROS  Renal/GU negative Renal ROS     Musculoskeletal negative musculoskeletal ROS (+)   Abdominal (+) + obese,   Peds  Hematology  (+) anemia ,   Anesthesia Other Findings   Reproductive/Obstetrics negative OB ROS                            Anesthesia Physical Anesthesia Plan  ASA: II  Anesthesia Plan: General   Post-op Pain Management:    Induction: Intravenous  PONV Risk Score and Plan:   Airway Management Planned: Natural Airway  Additional Equipment: None  Intra-op Plan:   Post-operative Plan:   Informed Consent: I have reviewed the patients History and Physical, chart, labs and discussed the procedure including the risks, benefits and alternatives for the proposed anesthesia with the patient or authorized representative who has indicated his/her understanding and acceptance.     Plan Discussed with: CRNA, Anesthesiologist and Surgeon  Anesthesia Plan Comments:         Anesthesia Quick Evaluation

## 2017-03-10 NOTE — Op Note (Addendum)
[facility] Patient Name: Brittany Baldwin Procedure Date: 03/10/2017 10:58 AM MRN: [id number] Account #: [id number] Date of Birth: [birth year] Admit Type: Outpatient Age: 46 Room: [facility] OR ROOM 01 Gender: Female Note Status: Finalized Procedure:            Upper GI endoscopy Indications:          Functional Dyspepsia, Esophageal dysphagia Providers:            Lin Landsman MD, MD Referring MD:         Halina Maidens, MD (Referring MD) Medicines:            Monitored Anesthesia Care Complications:        No immediate complications. Estimated blood loss: None. Procedure:            Pre-Anesthesia Assessment:                       - Prior to the procedure, a History and Physical was                        performed, and patient medications and allergies were                        reviewed. The patient is competent. The risks and                        benefits of the procedure and the sedation options and                        risks were discussed with the patient. All questions                        were answered and informed consent was obtained.                        Patient identification and proposed procedure were                        verified by the physician, the nurse, the                        anesthesiologist, the anesthetist and the technician in                        the pre-procedure area in the procedure room. Mental                        Status Examination: alert and oriented. Airway                        Examination: normal oropharyngeal airway and neck                        mobility. Respiratory Examination: clear to                        auscultation. CV Examination: normal. Prophylactic                        Antibiotics: The patient does not require prophylactic  antibiotics. Prior Anticoagulants: The patient has                        taken no previous anticoagulant or antiplatelet agents.                     ASA Grade Assessment: II - A patient with mild systemic                        disease. After reviewing the risks and benefits, the                        patient was deemed in satisfactory condition to undergo                        the procedure. The anesthesia plan was to use monitored                        anesthesia care (MAC). Immediately prior to                        administration of medications, the patient was                        re-assessed for adequacy to receive sedatives. The                        heart rate, respiratory rate, oxygen saturations, blood                        pressure, adequacy of pulmonary ventilation, and                        response to care were monitored throughout the                        procedure. The physical status of the patient was                        re-assessed after the procedure.                       After obtaining informed consent, the endoscope was                        passed under direct vision. Throughout the procedure,                        the patient's blood pressure, pulse, and oxygen                        saturations were monitored continuously. The Olympus                        190 Endoscope [id number]) was introduced through the                        mouth, and advanced to the second part of duodenum. The                        upper  GI endoscopy was accomplished without difficulty.                        The patient tolerated the procedure well. Findings:      Images not captured due to machine malfunction. Pictures recorded in       [place] to pt's chart      The duodenal bulb and second portion of the duodenum were normal.      Evidence of a Nissen fundoplication was found in the gastric fundus. The       wrap appeared intact. This was traversed. A TTS dilator was passed       through the scope. Given symptoms of dysphagia, empiric dilation with a       15-16.5-18 mm balloon dilator was  performed to 18 mm. The dilation site       was examined following endoscope reinsertion and showed no change.      The cardia and gastric fundus were normal on retroflexion.      The gastroesophageal junction and examined esophagus were normal. Impression:           - Normal duodenal bulb and second portion of the                        duodenum.                       - A Nissen fundoplication was found. The wrap appears                        intact. Dilated.                       - Normal gastroesophageal junction and esophagus.                       - No specimens collected. Recommendation:       - Return to my office as previously scheduled.                       - Discuss treatment for functional dyspepsia and                        dysphagia Procedure Code(s):    --- Professional ---                       [phone], Esophagogastroduodenoscopy, flexible, transoral;                        with dilation of gastric/duodenal stricture(s) (eg,                        balloon, bougie) Diagnosis Code(s):    --- Professional ---                       U13.244, Other specified postprocedural states                       K30, Functional dyspepsia                       R13.14, Dysphagia, pharyngoesophageal phase CPT copyright 2016 [employer]. All rights reserved. The codes documented in this report  are preliminary and upon coder review may  be revised to meet current compliance requirements. Dr. Ulyess Mort Lin Landsman MD, MD 03/10/2017 11:26:15 AM This report has been signed electronically. Number of Addenda: 0 Note Initiated On: 03/10/2017 10:58 AM      [facility]

## 2017-03-10 NOTE — H&P (Signed)
Cephas Darby, MD [address]  [place]  [place], [place] [address]  Main: [phone]  Fax: [phone] Pager: [phone]  Primary Care Physician:  Glean Hess, MD Primary Gastroenterologist:  Dr. Cephas Darby  Pre-Procedure History & Physical: HPI:  Brittany Baldwin is a 46 y.o. female is here for an endoscopy and colonoscopy.   Past Medical History:  Diagnosis Date  . Anemia    H/O   . Anxiety   . Dysrhythmia    SVT-HAD CARDIAC ABLATION DONE IN 2016 BUT PT STATES SHE STILL GETS INTERMITTENT TACHYCARDIC AND IS SYPMPTOMATIC WITH SOB, DIZZINESS DURING TACHY EPISODES (06-25-15)  . Fibroid   . GERD (gastroesophageal reflux disease)   . Headache    H/O  . Heart murmur    as child  . Hyperlipemia   . Motion sickness    cars, boats  . PE (pulmonary thromboembolism) ([place]) 2000   while on bed rest during pregnancy  . PONV (postoperative nausea and vomiting)   . Shortness of breath dyspnea    ASSOCIATED WITH TACHYCARDIA  . Tachycardia     Past Surgical History:  Procedure Laterality Date  . ABDOMINAL HYSTERECTOMY Bilateral 07/02/2015   Procedure: HYSTERECTOMY ABDOMINAL WITH BS;  Surgeon: Brayton Mars, MD;  Location: [facility];  Service: Gynecology;  Laterality: Bilateral;  . APPENDECTOMY    . CARDIAC ELECTROPHYSIOLOGY STUDY AND ABLATION    . CHOLECYSTECTOMY    . COLONOSCOPY  2013  . CYSTOSCOPY Bilateral 07/02/2015   Procedure: CYSTOSCOPY;  Surgeon: Brayton Mars, MD;  Location: [facility];  Service: Gynecology;  Laterality: Bilateral;  . LAPAROTOMY N/A 07/11/2015   Procedure: hematoma evacuation;  Surgeon: Brayton Mars, MD;  Location: [facility];  Service: Gynecology;  Laterality: N/A;  . NISSEN FUNDOPLICATION    . TUBAL LIGATION      Prior to Admission medications   Medication Sig Start Date End Date Taking? Authorizing Provider  atorvastatin (LIPITOR) 10 MG tablet Take 1 tablet (10 mg total) by mouth daily at 6 PM. 07/03/16  Yes Glean Hess, MD  Biotin 1 MG CAPS Take by mouth.   Yes [provider]  Multiple Vitamin (MULTIVITAMIN) tablet Take 1 tablet by mouth daily.   Yes [provider]  omeprazole (PRILOSEC) 40 MG capsule Take 1 capsule (40 mg total) by mouth daily. 02/20/17 04/21/17 Yes Vanga, Tally Due, MD  propranolol (INDERAL) 20 MG tablet TK 1 T PO PRN 11/14/14  Yes [provider]  propranolol ER (INDERAL LA) 80 MG 24 hr capsule TAKE ONE CAPSULE BY MOUTH TWICE DAILY 02/24/17  Yes Glean Hess, MD  senna (SENOKOT) 8.6 MG tablet Take 1 tablet by mouth daily.    [provider]    Allergies as of 02/20/2017 - Review Complete 02/20/2017  Allergen Reaction Noted  . Codeine Shortness Of Breath 05/09/2015  . Hydrocodone-acetaminophen Rash 05/09/2015  . Aspirin Other (See Comments) 05/09/2015  . Diphenhydramine hcl Other (See Comments) 05/09/2015  . Adhesive [tape] Rash 06/25/2015    Family History  Problem Relation Age of Onset  . Diabetes Mother   . Prostate cancer Father   . Congestive Heart Failure Father   . Heart failure Father   . Cancer Neg Hx     Social History   Social History  . Marital status: Married    Spouse name: N/A  . Number of children: N/A  . Years of education: N/A   Occupational History  . Not on  file.   Social History Main Topics  . Smoking status: Never Smoker  . Smokeless tobacco: Never Used  . Alcohol use No  . Drug use: No  . Sexual activity: Yes    Birth control/ protection: Surgical   Other Topics Concern  . Not on file   Social History Narrative  . No narrative on file    Review of Systems: See HPI, otherwise negative ROS  Physical Exam: BP 118/89   Pulse 74   Temp (!) 97.2 F (36.2 C) (Temporal)   Ht 5\' 6"  (1.676 m)   Wt 218 lb (98.9 kg)   LMP 06/26/2015 (Exact Date)   SpO2 100%   BMI 35.19 kg/m  General:   Alert,  pleasant and cooperative in NAD Head:  Normocephalic and atraumatic. Neck:  Supple; no  masses or thyromegaly. Lungs:  Clear throughout to auscultation.    Heart:  Regular rate and rhythm. Abdomen:  Soft, nontender and nondistended. Normal bowel sounds, without guarding, and without rebound.   Neurologic:  Alert and  oriented x4;  grossly normal neurologically.  Impression/Plan: Brittany Baldwin is here for an endoscopy and colonoscopy to be performed for chronic dysphagia, chronic dyspepsia, screening for colon cancer  Risks, benefits, limitations, and alternatives regarding  endoscopy and colonoscopy have been reviewed with the patient.  Questions have been answered.  All parties agreeable.   Sherri Sear, MD  03/10/2017, 10:51 AM

## 2017-03-10 NOTE — Transfer of Care (Signed)
Immediate Anesthesia Transfer of Care Note  Patient: Brittany Baldwin  Procedure(s) Performed: COLONOSCOPY WITH PROPOFOL (N/A ) ESOPHAGOGASTRODUODENOSCOPY (EGD) (N/A ) ESOPHAGEAL DILATION  Patient Location: PACU  Anesthesia Type: General  Level of Consciousness: awake, alert  and patient cooperative  Airway and Oxygen Therapy: Patient Spontanous Breathing and Patient connected to supplemental oxygen  Post-op Assessment: Post-op Vital signs reviewed, Patient's Cardiovascular Status Stable, Respiratory Function Stable, Patent Airway and No signs of Nausea or vomiting  Post-op Vital Signs: Reviewed and stable  Complications: No apparent anesthesia complications

## 2017-03-10 NOTE — Telephone Encounter (Signed)
Patients FMLA forms have been completed and faxed to [employer]. Patient notified.  Thanks [employer]

## 2017-03-10 NOTE — Pre-Procedure Instructions (Signed)
Pictures not captured on provation due to machine mal-function. Images scanned into pt's chart via [employer]. Refer to procedure report in Epic for findings  Cephas Darby, MD [address]  [place]  [place], [place] [address]  Main: [phone]  Fax: [phone] Pager: [phone]

## 2017-03-10 NOTE — Anesthesia Procedure Notes (Signed)
Performed by: Sana Tessmer Pre-anesthesia Checklist: Patient identified, Emergency Drugs available, Suction available, Timeout performed and Patient being monitored Patient Re-evaluated:Patient Re-evaluated prior to induction Oxygen Delivery Method: Nasal cannula Placement Confirmation: positive ETCO2       

## 2017-03-10 NOTE — Op Note (Addendum)
[facility] Patient Name: Brittany Baldwin Procedure Date: 03/10/2017 11:02 AM MRN: [id number] Account #: [id number] Date of Birth: 06-29-1970 Admit Type: Outpatient Age: 46 Room: [facility] OR ROOM 01 Gender: Female Note Status: Finalized Procedure:            Colonoscopy Indications:          Screening for colorectal malignant neoplasm, This is                        the patient's first colonoscopy Providers:            Lin Landsman MD, MD Medicines:            Monitored Anesthesia Care Complications:        No immediate complications. Estimated blood loss: None. Procedure:            Pre-Anesthesia Assessment:                       - Prior to the procedure, a History and Physical was                        performed, and patient medications and allergies were                        reviewed. The patient is competent. The risks and                        benefits of the procedure and the sedation options and                        risks were discussed with the patient. All questions                        were answered and informed consent was obtained.                        Patient identification and proposed procedure were                        verified by the physician, the nurse, the                        anesthesiologist, the anesthetist and the technician in                        the pre-procedure area in the procedure room. Mental                        Status Examination: alert and oriented. Airway                        Examination: normal oropharyngeal airway and neck                        mobility. Respiratory Examination: clear to                        auscultation. CV Examination: normal. Prophylactic                        Antibiotics:  The patient does not require prophylactic                        antibiotics. Prior Anticoagulants: The patient has                        taken no previous anticoagulant or antiplatelet agents.                       ASA Grade Assessment: II - A patient with mild systemic                        disease. After reviewing the risks and benefits, the                        patient was deemed in satisfactory condition to undergo                        the procedure. The anesthesia plan was to use monitored                        anesthesia care (MAC). Immediately prior to                        administration of medications, the patient was                        re-assessed for adequacy to receive sedatives. The                        heart rate, respiratory rate, oxygen saturations, blood                        pressure, adequacy of pulmonary ventilation, and                        response to care were monitored throughout the                        procedure. The physical status of the patient was                        re-assessed after the procedure.                       After obtaining informed consent, the colonoscope was                        passed under direct vision. Throughout the procedure,                        the patient's blood pressure, pulse, and oxygen                        saturations were monitored continuously. The Olympus                        Colonoscope 190 [id number]) was introduced through the                        anus and advanced  to the the cecum, identified by                        appendiceal orifice and ileocecal valve. The                        colonoscopy was performed without difficulty. The                        patient tolerated the procedure well. The quality of                        the bowel preparation was evaluated using the BBPS                        [facility] Bowel Preparation Scale) with scores of: Right                        Colon = 3, Transverse Colon = 3 and Left Colon = 3                        (entire mucosa seen well with no residual staining,                        small fragments of stool or opaque liquid). The total                         BBPS score equals 9. Findings:      Images not captured due to machine malfunction. Pictures recorded in       [place] to pt's chart      The perianal and digital rectal examinations were normal. Pertinent       negatives include no palpable rectal lesions.      Non-bleeding internal hemorrhoids were found during retroflexion. The       hemorrhoids were medium-sized.      The entire examined colon appeared normal. Impression:           - Non-bleeding internal hemorrhoids.                       - The entire examined colon is normal.                       - No specimens collected. Recommendation:       - Discharge patient to home.                       - Resume previous diet today.                       - Continue present medications.                       - Repeat colonoscopy in 10 years for surveillance. Procedure Code(s):    --- Professional ---                       A1937, Colorectal cancer screening; colonoscopy on                        individual not meeting criteria for high risk Diagnosis  Code(s):    --- Professional ---                       Z12.11, Encounter for screening for malignant neoplasm                        of colon                       K64.8, Other hemorrhoids CPT copyright 2016 American Medical Association. All rights reserved. The codes documented in this report are preliminary and upon coder review may  be revised to meet current compliance requirements. Dr. Ulyess Mort Lin Landsman MD, MD 03/10/2017 11:50:38 AM This report has been signed electronically. Number of Addenda: 0 Note Initiated On: 03/10/2017 11:02 AM Scope Withdrawal Time: 0 hours 10 minutes 38 seconds  Total Procedure Duration: 0 hours 15 minutes 58 seconds       [facility]

## 2017-03-10 NOTE — Anesthesia Postprocedure Evaluation (Signed)
Anesthesia Post Note  Patient: Brittany Baldwin  Procedure(s) Performed: COLONOSCOPY WITH PROPOFOL (N/A ) ESOPHAGOGASTRODUODENOSCOPY (EGD) (N/A ) ESOPHAGEAL DILATION  Patient location during evaluation: PACU Anesthesia Type: General Level of consciousness: awake Pain management: pain level controlled Vital Signs Assessment: post-procedure vital signs reviewed and stable Respiratory status: spontaneous breathing Cardiovascular status: blood pressure returned to baseline Postop Assessment: no headache Anesthetic complications: no    Lavonna Monarch

## 2017-03-11 ENCOUNTER — Encounter: Payer: Self-pay | Admitting: Gastroenterology

## 2017-03-11 ENCOUNTER — Telehealth: Payer: Self-pay

## 2017-03-11 NOTE — Progress Notes (Signed)
Patient complained of sneezing constantly all day yesterday and having a swollen eye, instructed to take benedryl and if it continues to call us back.  Anesthesia notified and GI doctor aware.

## 2017-03-11 NOTE — Telephone Encounter (Signed)
Patient contacted office this am 08:45 .  She stated that last night she started to experience sneezing fits, dry burning nose, watery eyes.  No fever, no body aches.   She has been advised as you suggested to try otc claritin, zyrtec or benadryl.  If she has a fever or body aches to go to urgent care as to she could be coming down with the flu. She said she really feels that its from the procedure. Please contact.  Thanks [employer]

## 2017-03-12 ENCOUNTER — Ambulatory Visit
Admission: RE | Admit: 2017-03-12 | Discharge: 2017-03-12 | Disposition: A | Discharge status: Home / Self Care | Payer: 59 | Source: Ambulatory Visit | Attending: Internal Medicine | Admitting: Internal Medicine

## 2017-03-12 ENCOUNTER — Other Ambulatory Visit: Payer: Self-pay | Admitting: *Deleted

## 2017-03-12 ENCOUNTER — Encounter: Payer: Self-pay | Admitting: Internal Medicine

## 2017-03-12 ENCOUNTER — Ambulatory Visit (INDEPENDENT_AMBULATORY_CARE_PROVIDER_SITE_OTHER): Payer: BLUE CROSS/BLUE SHIELD | Admitting: Internal Medicine

## 2017-03-12 ENCOUNTER — Inpatient Hospital Stay
Admission: RE | Admit: 2017-03-12 | Discharge: 2017-03-12 | Disposition: A | Discharge status: Home / Self Care | Payer: Self-pay | Source: Ambulatory Visit | Attending: *Deleted | Admitting: *Deleted

## 2017-03-12 VITALS — BP 110/78 | HR 85 | Temp 98.4°F | Ht 66.0 in | Wt 215.0 lb

## 2017-03-12 DIAGNOSIS — H1032 Unspecified acute conjunctivitis, left eye: Secondary | ICD-10-CM

## 2017-03-12 DIAGNOSIS — Z9289 Personal history of other medical treatment: Secondary | ICD-10-CM

## 2017-03-12 DIAGNOSIS — Z9109 Other allergy status, other than to drugs and biological substances: Secondary | ICD-10-CM

## 2017-03-12 DIAGNOSIS — Z1231 Encounter for screening mammogram for malignant neoplasm of breast: Secondary | ICD-10-CM | POA: Insufficient documentation

## 2017-03-12 MED ORDER — FLUTICASONE PROPIONATE 50 MCG/ACT NA SUSP: 2.0000 | Freq: Every day | NASAL | 3 refills | Status: DC

## 2017-03-12 MED ORDER — NEOMYCIN-POLYMYXIN-DEXAMETH 3.5-10000-0.1 OP SUSP: 2.0000 [drp] | Freq: Four times a day (QID) | OPHTHALMIC | 0 refills | Status: AC

## 2017-03-12 NOTE — Progress Notes (Signed)
Received call from patient regarding continued complaint of sneezing, nasal stuffiness, and "puffy" eyes. Pt spoke with Dr. Marius Ditch yesterday, 10/10, and was advised to try OTC allergy meds and benadyl, which patient verbalized she tried and is not helping. Pt was also advised to go to urgent care if symptoms persist, which she has not done. Patient spoke with Dr. Etta Quill today who advised her to seek care with her PCP or urgent care.

## 2017-03-12 NOTE — Progress Notes (Signed)
Date:  03/12/2017   Name:  Brittany Baldwin   DOB:  1970-10-02   MRN:  [id number]   Chief Complaint: Sinusitis ([place] since tuesday. Sneezing spasms, eye is watering and swollen. Nose is dry and running. Took benadryl and note any better.  ) Sinusitis  Associated symptoms include sneezing. Pertinent negatives include no chills, ear pain, shortness of breath, sinus pressure or sore throat.  patient underwent colonoscopy and endoscopy 2 days ago. Prior to that she felt absolutely fine with no symptoms of cold or sinus.When she woke up she began sneezing and her eye began watering.  Yesterday she had more nasal drip and sneezing as well as irritation. She took Benadryl twice without much improvement in symp    Review of Systems  Constitutional: Negative for chills, fatigue and fever.  HENT: Positive for postnasal drip and sneezing. Negative for ear pain, sinus pressure and sore throat.   Eyes: Positive for discharge, redness, itching and visual disturbance.  Respiratory: Negative for chest tightness and shortness of breath.   Cardiovascular: Negative for chest pain, palpitations and leg swelling.    Patient Active Problem List   Diagnosis Date Noted  . Esophageal dysphagia   . Colon cancer screening   . Functional dyspepsia 02/20/2017  . Hypercalcemia 01/18/2017  . Muscle spasm of right shoulder 08/05/2016  . Dependent edema 08/05/2016  . Plantar fasciitis, bilateral 08/05/2016  . 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 Elevated parathyroid hormone 07/03/2016  . 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 Status post abdominal hysterectomy 07/02/2015  . History of pulmonary embolus during pregnancy 05/16/2015  . Tachycardia 05/10/2015  . History of fundoplication 81/44/8185  . Hyperlipidemia, mixed 03/28/2014    Prior to Admission medications   Medication Sig Start Date End Date Taking? Authorizing Provider  atorvastatin (LIPITOR) 10 MG tablet Take 1 tablet (10 mg total) by  mouth daily at 6 PM. 07/03/16  Yes Glean Hess, MD  Biotin 1 MG CAPS Take by mouth.   Yes [provider]  Multiple Vitamin (MULTIVITAMIN) tablet Take 1 tablet by mouth daily.   Yes [provider]  omeprazole (PRILOSEC) 40 MG capsule Take 1 capsule (40 mg total) by mouth daily. 02/20/17 04/21/17 Yes Vanga, Tally Due, MD  propranolol (INDERAL) 20 MG tablet TK 1 T PO PRN 11/14/14  Yes [provider]  propranolol ER (INDERAL LA) 80 MG 24 hr capsule TAKE ONE CAPSULE BY MOUTH TWICE DAILY 02/24/17  Yes Glean Hess, MD  senna (SENOKOT) 8.6 MG tablet Take 1 tablet by mouth daily.   Yes [provider]    Allergies  Allergen Reactions  . Codeine Shortness Of Breath  . Hydrocodone-Acetaminophen Rash  . Aspirin Other (See Comments)    Stomach issues  . Diphenhydramine Hcl Other (See Comments)    Through an I.V. Drip in addition to other meds. Caused hallucinations  . Adhesive [Tape] Rash    ELECTRODES FROM HOLTER MONITOR CAUSED RASH    Past Surgical History:  Procedure Laterality Date  . ABDOMINAL HYSTERECTOMY Bilateral 07/02/2015   Procedure: HYSTERECTOMY ABDOMINAL WITH BS;  Surgeon: Brayton Mars, MD;  Location: [facility];  Service: Gynecology;  Laterality: Bilateral;  . APPENDECTOMY    . CARDIAC ELECTROPHYSIOLOGY STUDY AND ABLATION    . CHOLECYSTECTOMY    . COLONOSCOPY  2013  . COLONOSCOPY WITH PROPOFOL N/A 03/10/2017   Procedure: COLONOSCOPY WITH PROPOFOL;  Surgeon: Lin Landsman, MD;  Location: [facility];  Service: Endoscopy;  Laterality: N/A;  . CYSTOSCOPY Bilateral 07/02/2015   Procedure: CYSTOSCOPY;  Surgeon: Brayton Mars, MD;  Location: [facility];  Service: Gynecology;  Laterality: Bilateral;  . ESOPHAGEAL DILATION  03/10/2017   Procedure: ESOPHAGEAL DILATION;  Surgeon: Lin Landsman, MD;  Location: [facility];  Service: Endoscopy;;  . ESOPHAGOGASTRODUODENOSCOPY N/A 03/10/2017   Procedure:  ESOPHAGOGASTRODUODENOSCOPY (EGD);  Surgeon: Lin Landsman, MD;  Location: [facility];  Service: Endoscopy;  Laterality: N/A;  . LAPAROTOMY N/A 07/11/2015   Procedure: hematoma evacuation;  Surgeon: Brayton Mars, MD;  Location: [facility];  Service: Gynecology;  Laterality: N/A;  . NISSEN FUNDOPLICATION    . TUBAL LIGATION      Social History  Substance Use Topics  . Smoking status: Never Smoker  . Smokeless tobacco: Never Used  . Alcohol use No     Medication list has been reviewed and updated.  PHQ 2/9 Scores 07/03/2016 02/13/2016  PHQ - 2 Score 0 0    Physical Exam  Constitutional: She is oriented to person, place, and time. She appears well-developed. No distress.  HENT:  Head: Normocephalic and atraumatic.  Right Ear: Tympanic membrane and ear canal normal.  Left Ear: Tympanic membrane and ear canal normal.  Nose: Right sinus exhibits no maxillary sinus tenderness. Left sinus exhibits no maxillary sinus tenderness.  Mouth/Throat: Posterior oropharyngeal erythema present. No oropharyngeal exudate or posterior oropharyngeal edema.  Eyes: Right eye exhibits no discharge. Left eye exhibits chemosis and discharge. Right conjunctiva is not injected. Left conjunctiva is injected.  Pulmonary/Chest: Effort normal. No respiratory distress.  Musculoskeletal: Normal range of motion.  Neurological: She is alert and oriented to person, place, and time.  Skin: Skin is warm and dry. No rash noted.  Psychiatric: She has a normal mood and affect. Her behavior is normal. Thought content normal.  Nursing note and vitals reviewed.   BP 110/78   Pulse 85   Temp 98.4 F (36.9 C) (Oral)   Ht 5\' 6"  (1.676 m)   Wt 215 lb (97.5 kg)   LMP 06/26/2015 (Exact Date)   SpO2 98%   BMI 34.70 kg/m   Assessment and Plan: 1. Acute conjunctivitis of left eye, unspecified acute conjunctivitis type - neomycin-polymyxin b-dexamethasone (MAXITROL) 3.5-10000-0.1 SUSP; Place 2 drops into  both eyes every 6 (six) hours.  Dispense: 5 mL; Refill: 0  2. Environmental allergies May be reacting to some agent used during the procedure Continue Benadryl every 4-6 hours - fluticasone (FLONASE) 50 MCG/ACT nasal spray; Place 2 sprays into both nostrils daily.  Dispense: 16 g; Refill: 3   Meds ordered this encounter  Medications  . fluticasone (FLONASE) 50 MCG/ACT nasal spray    Sig: Place 2 sprays into both nostrils daily.    Dispense:  16 g    Refill:  3  . neomycin-polymyxin b-dexamethasone (MAXITROL) 3.5-10000-0.1 SUSP    Sig: Place 2 drops into both eyes every 6 (six) hours.    Dispense:  5 mL    Refill:  0    Partially dictated using Editor, commissioning. Any errors are unintentional.  Halina Maidens, MD [facility]  03/12/2017

## 2017-03-12 NOTE — Patient Instructions (Signed)
Take Benadryl every 4-6 hours as needed for drip and sneezing

## 2017-03-16 ENCOUNTER — Telehealth: Payer: Self-pay

## 2017-03-16 NOTE — Telephone Encounter (Signed)
Patient called stated that her FMLA form dates needs to be upated and clarified since she had the allergic reaction.  I told her I will reprint the FMLA form and adjust. She has not been back to work yet.  Do you still want her to have the CAT Scan? She said if so she would like to have it done while she is out of work. Please advise.  Thanks [employer]

## 2017-03-18 ENCOUNTER — Telehealth: Payer: Self-pay | Admitting: Gastroenterology

## 2017-03-18 ENCOUNTER — Telehealth: Payer: Self-pay

## 2017-03-18 NOTE — Telephone Encounter (Signed)
Patient has been informed that her updated FMLA form has been sent back .  Her case has not been reviewed yet but they will contact me if any additional information or clarifications are needed.  Thanks [employer]

## 2017-03-18 NOTE — Telephone Encounter (Signed)
Called [employer] at [phone] for approval of CT A/P  Test approved  Authorization number is [id number]: valid from oct 17 to Apr 16, 2017   Cephas Darby, MD [address]  [place]  [place], [place] [address]  Main: [phone]  Fax: [phone] Pager: [phone]

## 2017-03-19 ENCOUNTER — Ambulatory Visit
Admission: RE | Admit: 2017-03-19 | Discharge: 2017-03-19 | Disposition: A | Discharge status: Home / Self Care | Payer: 59 | Source: Ambulatory Visit | Attending: Gastroenterology | Admitting: Gastroenterology

## 2017-03-19 DIAGNOSIS — R1030 Lower abdominal pain, unspecified: Secondary | ICD-10-CM | POA: Insufficient documentation

## 2017-03-19 MED ORDER — IOPAMIDOL (ISOVUE-300) INJECTION 61%
100.0000 mL | Freq: Once | INTRAVENOUS | Status: AC | PRN
  Administered 2017-03-19: 100 mL via INTRAVENOUS

## 2017-03-23 ENCOUNTER — Telehealth: Payer: Self-pay | Admitting: Gastroenterology

## 2017-03-23 NOTE — Telephone Encounter (Signed)
Patient called and wanted to let you know that if you get another fax she needs it filled out for her short term disability.

## 2017-05-23 ENCOUNTER — Other Ambulatory Visit: Payer: Self-pay | Admitting: Internal Medicine

## 2017-06-03 ENCOUNTER — Ambulatory Visit (INDEPENDENT_AMBULATORY_CARE_PROVIDER_SITE_OTHER): Payer: 59 | Admitting: Internal Medicine

## 2017-06-03 ENCOUNTER — Encounter: Payer: Self-pay | Admitting: Internal Medicine

## 2017-06-03 VITALS — BP 124/78 | HR 94 | Temp 98.1°F | Ht 66.0 in | Wt 213.0 lb

## 2017-06-03 DIAGNOSIS — J069 Acute upper respiratory infection, unspecified: Secondary | ICD-10-CM | POA: Diagnosis not present

## 2017-06-03 MED ORDER — AZITHROMYCIN 250 MG PO TABS: ORAL_TABLET | ORAL | 0 refills | Status: DC

## 2017-06-03 NOTE — Patient Instructions (Signed)
Drink fluids -   Tylenol 2-3 times a day

## 2017-06-03 NOTE — Progress Notes (Signed)
Date:  06/03/2017   Name:  Brittany Baldwin   DOB:  [birth year]   MRN:  [id number]   Chief Complaint: Cough (Started 3 days ago with sore throat. Coughing with yellow mucous. Throat still sore. Feels more tired than usual. Congestion is chest. Diarrhea, chills, and low grade fever last night. 99.8.)  URI   This is a new problem. The current episode started in the past 7 days. The maximum temperature recorded prior to her arrival was 100.4 - 100.9 F. Associated symptoms include coughing, diarrhea and a sore throat.     Review of Systems  HENT: Positive for sore throat.   Respiratory: Positive for cough.   Gastrointestinal: Positive for diarrhea.    Patient Active Problem List   Diagnosis Date Noted  . Esophageal dysphagia   . Colon cancer screening   . Functional dyspepsia 02/20/2017  . Hypercalcemia 01/18/2017  . Muscle spasm of right shoulder 08/05/2016  . Dependent edema 08/05/2016  . Plantar fasciitis, bilateral 08/05/2016  . 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 Elevated parathyroid hormone 07/03/2016  . 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 Status post abdominal hysterectomy 07/02/2015  . History of pulmonary embolus during pregnancy 05/16/2015  . Tachycardia 05/10/2015  . History of fundoplication 66/44/0347  . Hyperlipidemia, mixed 03/28/2014    Prior to Admission medications   Medication Sig Start Date End Date Taking? Authorizing Provider  atorvastatin (LIPITOR) 10 MG tablet Take 1 tablet (10 mg total) by mouth daily at 6 PM. 07/03/16  Yes Glean Hess, MD  Biotin 1 MG CAPS Take by mouth.   Yes [provider]  fluticasone (FLONASE) 50 MCG/ACT nasal spray Place 2 sprays into both nostrils daily. 03/12/17  Yes Glean Hess, MD  Multiple Vitamin (MULTIVITAMIN) tablet Take 1 tablet by mouth daily.   Yes [provider]  omeprazole (PRILOSEC) 40 MG capsule TAKE ONE CAPSULE BY MOUTH EVERY DAY 05/13/17  Yes Vanga, Tally Due, MD    propranolol (INDERAL) 20 MG tablet TK 1 T PO PRN 11/14/14  Yes [provider]  propranolol ER (INDERAL LA) 80 MG 24 hr capsule TAKE ONE CAPSULE BY MOUTH TWICE DAILY 05/23/17  Yes Glean Hess, MD    Allergies  Allergen Reactions  . Codeine Shortness Of Breath  . Hydrocodone-Acetaminophen Rash  . Aspirin Other (See Comments)    Stomach issues  . Diphenhydramine Hcl Other (See Comments)    Through an I.V. Drip in addition to other meds. Caused hallucinations  . Adhesive [Tape] Rash    ELECTRODES FROM HOLTER MONITOR CAUSED RASH    Past Surgical History:  Procedure Laterality Date  . ABDOMINAL HYSTERECTOMY Bilateral 07/02/2015   Procedure: HYSTERECTOMY ABDOMINAL WITH BS;  Surgeon: Brayton Mars, MD;  Location: [facility];  Service: Gynecology;  Laterality: Bilateral;  . APPENDECTOMY    . CARDIAC ELECTROPHYSIOLOGY STUDY AND ABLATION    . CHOLECYSTECTOMY    . COLONOSCOPY  2013  . COLONOSCOPY WITH PROPOFOL N/A 03/10/2017   Procedure: COLONOSCOPY WITH PROPOFOL;  Surgeon: Lin Landsman, MD;  Location: [place];  Service: Endoscopy;  Laterality: N/A;  . CYSTOSCOPY Bilateral 07/02/2015   Procedure: CYSTOSCOPY;  Surgeon: Brayton Mars, MD;  Location: [facility];  Service: Gynecology;  Laterality: Bilateral;  . ESOPHAGEAL DILATION  03/10/2017   Procedure: ESOPHAGEAL DILATION;  Surgeon: Lin Landsman, MD;  Location: [place];  Service: Endoscopy;;  . ESOPHAGOGASTRODUODENOSCOPY N/A 03/10/2017   Procedure: ESOPHAGOGASTRODUODENOSCOPY (EGD);  Surgeon:  Lin Landsman, MD;  Location: [place];  Service: Endoscopy;  Laterality: N/A;  . LAPAROTOMY N/A 07/11/2015   Procedure: hematoma evacuation;  Surgeon: Brayton Mars, MD;  Location: [facility];  Service: Gynecology;  Laterality: N/A;  . NISSEN FUNDOPLICATION    . TUBAL LIGATION      Social History   Tobacco Use  . Smoking status: Never Smoker  . Smokeless tobacco: Never  Used  Substance Use Topics  . Alcohol use: No  . Drug use: No     Medication list has been reviewed and updated.  PHQ 2/9 Scores 07/03/2016 02/13/2016  PHQ - 2 Score 0 0    Physical Exam  Constitutional: She has a sickly appearance.  HENT:  Right Ear: Tympanic membrane and ear canal normal.  Left Ear: Tympanic membrane and ear canal normal.  Nose: Right sinus exhibits no maxillary sinus tenderness. Left sinus exhibits no maxillary sinus tenderness.  Mouth/Throat: Posterior oropharyngeal erythema present. No posterior oropharyngeal edema.  Neck: Normal range of motion. Neck supple. No thyromegaly present.  Cardiovascular: Normal rate, regular rhythm and normal heart sounds.  Pulmonary/Chest: Effort normal and breath sounds normal. She has no wheezes. She has no rales.  Abdominal: There is splenomegaly (question mild enlargement).    BP 124/78   Pulse 94   Temp 98.1 F (36.7 C) (Oral)   Ht 5\' 6"  (1.676 m)   Wt 213 lb (96.6 kg)   LMP 06/26/2015 (Exact Date)   SpO2 100%   BMI 34.38 kg/m   Assessment and Plan: 1. Upper respiratory tract infection, unspecified type Concern for possible EBV infection Pt to remain out of work until 06/08/17  Push fluids, take tylenol for fever and sore throat - azithromycin (ZITHROMAX Z-PAK) 250 MG tablet; UAD  Dispense: 6 each; Refill: 0 - Monospot   Meds ordered this encounter  Medications  . azithromycin (ZITHROMAX Z-PAK) 250 MG tablet    Sig: UAD    Dispense:  6 each    Refill:  0    Partially dictated using Editor, commissioning. Any errors are unintentional.  Halina Maidens, MD [facility]  06/03/2017

## 2017-06-04 LAB — MONONUCLEOSIS SCREEN: Mono Screen: NEGATIVE

## 2017-06-08 ENCOUNTER — Telehealth: Payer: Self-pay

## 2017-06-08 MED ORDER — BENZONATATE 200 MG PO CAPS: 200.0000 mg | ORAL_CAPSULE | Freq: Two times a day (BID) | ORAL | 0 refills | Status: DC | PRN

## 2017-06-08 NOTE — Telephone Encounter (Signed)
Patient called stating not feeling better- Cough is much worse and can tell from talking with patient. Finished antibiotics yesterday. Informed her [employer] works for 5 days after the final day taken. Spoke with Dr berglund about the cough. Pt is allergic to codeine would be ok with trying tessalon perles and delcum cough over the counter. Dr Army Melia said its safe to take both- informed pt and sent in perles to pharmacy. Told pt if after this antibiotic she is still no better we need to see her in office again.

## 2017-06-10 ENCOUNTER — Ambulatory Visit (INDEPENDENT_AMBULATORY_CARE_PROVIDER_SITE_OTHER): Payer: 59 | Admitting: Internal Medicine

## 2017-06-10 ENCOUNTER — Encounter: Payer: Self-pay | Admitting: Internal Medicine

## 2017-06-10 VITALS — BP 112/68 | HR 81 | Temp 97.9°F | Ht 66.0 in | Wt 213.0 lb

## 2017-06-10 DIAGNOSIS — J40 Bronchitis, not specified as acute or chronic: Secondary | ICD-10-CM | POA: Diagnosis not present

## 2017-06-10 MED ORDER — DOXYCYCLINE HYCLATE 100 MG PO TABS
100.0000 mg | ORAL_TABLET | Freq: Two times a day (BID) | ORAL | 0 refills | Status: AC
Start: 2017-06-10 — End: 2017-06-20

## 2017-06-10 NOTE — Progress Notes (Signed)
Date:  06/10/2017   Name:  Brittany Baldwin   DOB:  June 20, 1970   MRN:  [id number]   Chief Complaint: Cough (Didn't try delcum- pills helped cough. Congestion in chest- no mucous coming up. )  Cough  This is a new problem. The current episode started 1 to 4 weeks ago. The problem has been gradually improving. The problem occurs every few minutes. The cough is non-productive (and loose). Pertinent negatives include no chest pain, chills, ear pain, fever, headaches, shortness of breath or wheezing.  Still coughing some yellow phlegm at time.  Cough is still very bothersome but Benzonate did help. Fever has resolved.   Review of Systems  Constitutional: Positive for fatigue. Negative for chills and fever.  HENT: Negative for congestion, ear pain and trouble swallowing.   Respiratory: Positive for cough and chest tightness. Negative for shortness of breath and wheezing.   Cardiovascular: Positive for palpitations. Negative for chest pain and leg swelling.  Gastrointestinal: Positive for abdominal pain. Negative for diarrhea and vomiting.  Neurological: Negative for dizziness and headaches.    Patient Active Problem List   Diagnosis Date Noted  . Esophageal dysphagia   . Colon cancer screening   . Functional dyspepsia 02/20/2017  . Hypercalcemia 01/18/2017  . Muscle spasm of right shoulder 08/05/2016  . Dependent edema 08/05/2016  . Plantar fasciitis, bilateral 08/05/2016  . 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 Elevated parathyroid hormone 07/03/2016  . 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 Status post abdominal hysterectomy 07/02/2015  . History of pulmonary embolus during pregnancy 05/16/2015  . Tachycardia 05/10/2015  . History of fundoplication 40/98/1191  . Hyperlipidemia, mixed 03/28/2014    Prior to Admission medications   Medication Sig Start Date End Date Taking? Authorizing Provider  atorvastatin (LIPITOR) 10 MG tablet Take 1 tablet (10 mg total) by mouth  daily at 6 PM. 07/03/16  Yes Glean Hess, MD  benzonatate (TESSALON) 200 MG capsule Take 1 capsule (200 mg total) by mouth 2 (two) times daily as needed for cough. 06/08/17  Yes Glean Hess, MD  Biotin 1 MG CAPS Take by mouth.   Yes [provider]  fluticasone (FLONASE) 50 MCG/ACT nasal spray Place 2 sprays into both nostrils daily. 03/12/17  Yes Glean Hess, MD  Multiple Vitamin (MULTIVITAMIN) tablet Take 1 tablet by mouth daily.   Yes [provider]  omeprazole (PRILOSEC) 40 MG capsule TAKE ONE CAPSULE BY MOUTH EVERY DAY 05/13/17  Yes Vanga, Tally Due, MD  propranolol (INDERAL) 20 MG tablet TK 1 T PO PRN 11/14/14  Yes [provider]  propranolol ER (INDERAL LA) 80 MG 24 hr capsule TAKE ONE CAPSULE BY MOUTH TWICE DAILY 05/23/17  Yes Glean Hess, MD    Allergies  Allergen Reactions  . Codeine Shortness Of Breath  . Hydrocodone-Acetaminophen Rash  . Aspirin Other (See Comments)    Stomach issues  . Diphenhydramine Hcl Other (See Comments)    Through an I.V. Drip in addition to other meds. Caused hallucinations  . Adhesive [Tape] Rash    ELECTRODES FROM HOLTER MONITOR CAUSED RASH    Past Surgical History:  Procedure Laterality Date  . ABDOMINAL HYSTERECTOMY Bilateral 07/02/2015   Procedure: HYSTERECTOMY ABDOMINAL WITH BS;  Surgeon: Brayton Mars, MD;  Location: [facility];  Service: Gynecology;  Laterality: Bilateral;  . APPENDECTOMY    . CARDIAC ELECTROPHYSIOLOGY STUDY AND ABLATION    . CHOLECYSTECTOMY    . COLONOSCOPY  2013  .  COLONOSCOPY WITH PROPOFOL N/A 03/10/2017   Procedure: COLONOSCOPY WITH PROPOFOL;  Surgeon: Lin Landsman, MD;  Location: [place];  Service: Endoscopy;  Laterality: N/A;  . CYSTOSCOPY Bilateral 07/02/2015   Procedure: CYSTOSCOPY;  Surgeon: Brayton Mars, MD;  Location: [facility];  Service: Gynecology;  Laterality: Bilateral;  . ESOPHAGEAL DILATION  03/10/2017   Procedure: ESOPHAGEAL  DILATION;  Surgeon: Lin Landsman, MD;  Location: [place];  Service: Endoscopy;;  . ESOPHAGOGASTRODUODENOSCOPY N/A 03/10/2017   Procedure: ESOPHAGOGASTRODUODENOSCOPY (EGD);  Surgeon: Lin Landsman, MD;  Location: [place];  Service: Endoscopy;  Laterality: N/A;  . LAPAROTOMY N/A 07/11/2015   Procedure: hematoma evacuation;  Surgeon: Brayton Mars, MD;  Location: [facility];  Service: Gynecology;  Laterality: N/A;  . NISSEN FUNDOPLICATION    . TUBAL LIGATION      Social History   Tobacco Use  . Smoking status: Never Smoker  . Smokeless tobacco: Never Used  Substance Use Topics  . Alcohol use: No  . Drug use: No     Medication list has been reviewed and updated.  PHQ 2/9 Scores 07/03/2016 02/13/2016  PHQ - 2 Score 0 0    Physical Exam  Constitutional: She is oriented to person, place, and time. She appears well-developed. She has a sickly appearance. No distress.  HENT:  Head: Normocephalic and atraumatic.  Neck: Normal range of motion. Neck supple. No thyromegaly present.  Cardiovascular: Normal rate, regular rhythm and normal heart sounds.  Pulmonary/Chest: Effort normal and breath sounds normal. No respiratory distress. She has no decreased breath sounds. She has no wheezes. She has no rales.  Abdominal: Soft. Normal appearance and bowel sounds are normal. There is generalized tenderness. There is no rigidity and no guarding.  Musculoskeletal: She exhibits no edema.  Neurological: She is alert and oriented to person, place, and time.  Skin: Skin is warm and dry. No rash noted.  Psychiatric: She has a normal mood and affect. Her behavior is normal. Thought content normal.  Nursing note and vitals reviewed.   BP 112/68   Pulse 81   Temp 97.9 F (36.6 C) (Oral)   Ht 5\' 6"  (1.676 m)   Wt 213 lb (96.6 kg)   LMP 06/26/2015 (Exact Date)   SpO2 99%   BMI 34.38 kg/m   Assessment and Plan: 1. Bronchitis Continue Benzonate Take Mucinex  plain or DM bid - doxycycline (VIBRA-TABS) 100 MG tablet; Take 1 tablet (100 mg total) by mouth 2 (two) times daily for 10 days.  Dispense: 20 tablet; Refill: 0   Meds ordered this encounter  Medications  . doxycycline (VIBRA-TABS) 100 MG tablet    Sig: Take 1 tablet (100 mg total) by mouth 2 (two) times daily for 10 days.    Dispense:  20 tablet    Refill:  0    Partially dictated using Editor, commissioning. Any errors are unintentional.  Halina Maidens, MD [facility]  06/10/2017

## 2017-06-10 NOTE — Patient Instructions (Signed)
Continue Benzonate perles  Begin Mucinex tablets (plain or DM) twice a day  Lots of fluids

## 2017-07-18 ENCOUNTER — Other Ambulatory Visit: Payer: Self-pay | Admitting: Internal Medicine

## 2017-07-18 DIAGNOSIS — E782 Mixed hyperlipidemia: Secondary | ICD-10-CM

## 2017-07-21 ENCOUNTER — Telehealth: Payer: Self-pay

## 2017-07-21 NOTE — Telephone Encounter (Signed)
Patient called stating she needed FMLA forms changed again per her job. Last week she was out Monday, went back to work Tuesday, and was out again Wednesday for a flare up. Stated when she came back to work on Wednesday they told her her FMLA does not cover this since she took a break in between a flare up.   Spoke with Dr Army Melia- changed her FMLA forms to 5 times per 1 week or 5 days per episode. Called and informed patient and faxed new FMLA form into her work. Received confirmation fax.

## 2017-09-03 ENCOUNTER — Other Ambulatory Visit: Payer: Self-pay | Admitting: Internal Medicine

## 2017-10-06 ENCOUNTER — Ambulatory Visit (INDEPENDENT_AMBULATORY_CARE_PROVIDER_SITE_OTHER): Payer: 59 | Admitting: Internal Medicine

## 2017-10-06 ENCOUNTER — Encounter: Payer: Self-pay | Admitting: Internal Medicine

## 2017-10-06 VITALS — BP 122/82 | HR 91 | Temp 98.0°F | Resp 16 | Ht 66.0 in | Wt 212.0 lb

## 2017-10-06 DIAGNOSIS — R490 Dysphonia: Secondary | ICD-10-CM

## 2017-10-06 DIAGNOSIS — J382 Nodules of vocal cords: Secondary | ICD-10-CM

## 2017-10-06 HISTORY — DX: Dysphonia: R49.0

## 2017-10-06 NOTE — Progress Notes (Signed)
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  Was not sick but has hx of nodules on vocal cords and she does sing for church and loses voice at high notes at times. )  HPI She has hx of vocal cord nodules.  Last seen by ENT about 1.5 yrs ago.  Since then has had some intermittent loss of voice that would last one or two days after singing in church.  This time the loss has persisted for three weeks.  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  She may have short term disability benefits.   Review of Systems  Constitutional: Negative for chills, fatigue, fever and unexpected weight change.  HENT: Positive for voice change. Negative for sore throat.   Respiratory: Negative for chest tightness, shortness of breath and stridor.   Cardiovascular: Negative for chest pain.    Patient Active Problem List   Diagnosis Date Noted  . Hoarseness, persistent 10/06/2017  . Esophageal dysphagia   . Colon cancer screening   . Functional dyspepsia 02/20/2017  . Hypercalcemia 01/18/2017  . Muscle spasm of right shoulder 08/05/2016  . Dependent edema 08/05/2016  . Plantar fasciitis, bilateral 08/05/2016  . 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 Elevated parathyroid hormone 07/03/2016  . 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 Status post abdominal hysterectomy 07/02/2015  . History of pulmonary embolus during pregnancy 05/16/2015  . Tachycardia 05/10/2015  . History of fundoplication 59/16/3846  . Hyperlipidemia, mixed 03/28/2014    Prior to Admission medications   Medication Sig Start Date End Date Taking? Authorizing Provider  atorvastatin (LIPITOR) 10 MG tablet TAKE 1 TABLET(10 MG) BY MOUTH DAILY AT 6 PM 07/18/17   Glean Hess, MD  benzonatate (TESSALON) 200 MG capsule Take 1 capsule (200 mg total) by mouth 2 (two)  times daily as needed for cough. 06/08/17   Glean Hess, MD  Biotin 1 MG CAPS Take by mouth.    [provider]  fluticasone (FLONASE) 50 MCG/ACT nasal spray Place 2 sprays into both nostrils daily. 03/12/17   Glean Hess, MD  Multiple Vitamin (MULTIVITAMIN) tablet Take 1 tablet by mouth daily.    [provider]  omeprazole (PRILOSEC) 40 MG capsule TAKE ONE CAPSULE BY MOUTH EVERY DAY 05/13/17   Vanga, Tally Due, MD  propranolol (INDERAL) 20 MG tablet TK 1 T PO PRN 11/14/14   [provider]  propranolol ER (INDERAL LA) 80 MG 24 hr capsule TAKE ONE CAPSULE BY MOUTH TWICE DAILY 09/04/17   Glean Hess, MD    Allergies  Allergen Reactions  . Codeine Shortness Of Breath  . Hydrocodone-Acetaminophen Rash  . Aspirin Other (See Comments)    Stomach issues  . Diphenhydramine Hcl Other (See Comments)    Through an I.V. Drip in addition to other meds. Caused hallucinations  . Adhesive [Tape] Rash    ELECTRODES FROM HOLTER MONITOR CAUSED RASH    Past Surgical History:  Procedure Laterality Date  . ABDOMINAL HYSTERECTOMY Bilateral 07/02/2015   Procedure: HYSTERECTOMY ABDOMINAL WITH BS;  Surgeon: Brayton Mars, MD;  Location: [facility];  Service: Gynecology;  Laterality: Bilateral;  . APPENDECTOMY    . CARDIAC ELECTROPHYSIOLOGY STUDY AND ABLATION    . CHOLECYSTECTOMY    .  COLONOSCOPY  2013  . COLONOSCOPY WITH PROPOFOL N/A 03/10/2017   Procedure: COLONOSCOPY WITH PROPOFOL;  Surgeon: Lin Landsman, MD;  Location: [place];  Service: Endoscopy;  Laterality: N/A;  . CYSTOSCOPY Bilateral 07/02/2015   Procedure: CYSTOSCOPY;  Surgeon: Brayton Mars, MD;  Location: [facility];  Service: Gynecology;  Laterality: Bilateral;  . ESOPHAGEAL DILATION  03/10/2017   Procedure: ESOPHAGEAL DILATION;  Surgeon: Lin Landsman, MD;  Location: [place];  Service: Endoscopy;;  . ESOPHAGOGASTRODUODENOSCOPY N/A 03/10/2017   Procedure:  ESOPHAGOGASTRODUODENOSCOPY (EGD);  Surgeon: Lin Landsman, MD;  Location: [place];  Service: Endoscopy;  Laterality: N/A;  . LAPAROTOMY N/A 07/11/2015   Procedure: hematoma evacuation;  Surgeon: Brayton Mars, MD;  Location: [facility];  Service: Gynecology;  Laterality: N/A;  . NISSEN FUNDOPLICATION    . TUBAL LIGATION      Social History   Tobacco Use  . Smoking status: Never Smoker  . Smokeless tobacco: Never Used  Substance Use Topics  . Alcohol use: No  . Drug use: No     Medication list has been reviewed and updated.  PHQ 2/9 Scores 10/06/2017 07/03/2016 02/13/2016  PHQ - 2 Score 0 0 0    Physical Exam  Constitutional: She is oriented to person, place, and time. She appears well-developed. No distress.  HENT:  Head: Normocephalic and atraumatic.  Mouth/Throat: No posterior oropharyngeal edema or posterior oropharyngeal erythema.  Completely hoarse - voice is a low whisper  Neck: Normal range of motion. Neck supple.  Cardiovascular: Normal rate, regular rhythm and normal heart sounds.  Pulmonary/Chest: Effort normal and breath sounds normal. No respiratory distress.  Musculoskeletal: Normal range of motion.  Lymphadenopathy:    She has no cervical adenopathy.  Neurological: She is alert and oriented to person, place, and time.  Skin: Skin is warm and dry. No rash noted.  Psychiatric: She has a normal mood and affect. Her behavior is normal. Thought content normal.    BP 122/82   Pulse 91   Temp 98 F (36.7 C)   Resp 16   Ht 5\' 6"  (1.676 m)   Wt 212 lb (96.2 kg)   LMP 06/26/2015 (Exact Date)   SpO2 99%   BMI 34.22 kg/m   Assessment and Plan: 1. Vocal cord nodules Complete voice rest Note to work given - Ambulatory referral to ENT  2. Hoarseness, persistent - Ambulatory referral to ENT   No orders of the defined types were placed in this encounter.   Partially dictated using Editor, commissioning. Any errors are unintentional.  Halina Maidens, MD [facility]  10/06/2017

## 2017-10-09 ENCOUNTER — Encounter: Payer: 59 | Admitting: Internal Medicine

## 2017-10-09 ENCOUNTER — Encounter: Payer: Self-pay | Admitting: Internal Medicine

## 2017-10-16 ENCOUNTER — Encounter: Payer: Self-pay | Admitting: Internal Medicine

## 2017-10-19 ENCOUNTER — Encounter: Payer: 59 | Admitting: Internal Medicine

## 2017-12-06 ENCOUNTER — Other Ambulatory Visit: Payer: Self-pay | Admitting: Internal Medicine

## 2017-12-06 DIAGNOSIS — E782 Mixed hyperlipidemia: Secondary | ICD-10-CM

## 2018-01-22 ENCOUNTER — Other Ambulatory Visit: Payer: Self-pay | Admitting: Internal Medicine

## 2018-01-28 ENCOUNTER — Encounter: Payer: Self-pay | Admitting: Internal Medicine

## 2018-01-28 ENCOUNTER — Ambulatory Visit (INDEPENDENT_AMBULATORY_CARE_PROVIDER_SITE_OTHER): Payer: 59 | Admitting: Internal Medicine

## 2018-01-28 VITALS — BP 104/78 | HR 70 | Ht 66.0 in | Wt 207.0 lb

## 2018-01-28 DIAGNOSIS — R000 Tachycardia, unspecified: Secondary | ICD-10-CM

## 2018-01-28 DIAGNOSIS — B354 Tinea corporis: Secondary | ICD-10-CM

## 2018-01-28 MED ORDER — FLUCONAZOLE 100 MG PO TABS: 100.0000 mg | ORAL_TABLET | Freq: Every day | ORAL | 0 refills | Status: AC

## 2018-01-28 NOTE — Progress Notes (Signed)
Date:  01/28/2018   Name:  Brittany Baldwin   DOB:  04/02/1971   MRN:  [id number]   Chief Complaint: Rash (Started Monday. Itching rash outside of vagina. Tried cornstarch power and benadryl. Also, tried cortisone cream. Did not help much. Not painful. )  Rash  This is a new problem. The current episode started in the past 7 days. The problem is unchanged. The affected locations include the genitalia and groin. The rash is characterized by itchiness. She was exposed to nothing. Pertinent negatives include no cough, fatigue, fever or shortness of breath. Past treatments include anti-itch cream and antihistamine. The treatment provided no relief.   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 She takes propranolol daily and PRN. She has episodes about once a week which is slightly more often.   Review of Systems  Constitutional: Negative for chills, fatigue and fever.  Respiratory: Negative for cough, chest tightness, shortness of breath and wheezing.   Cardiovascular: Positive for palpitations. Negative for chest pain and leg swelling.  Genitourinary: Negative for dysuria, frequency and vaginal discharge.  Skin: Positive for rash.    Patient Active Problem List   Diagnosis Date Noted  . Hoarseness, persistent 10/06/2017  . Esophageal dysphagia   . Functional dyspepsia 02/20/2017  . Hypercalcemia 01/18/2017  . Muscle spasm of right shoulder 08/05/2016  . Dependent edema 08/05/2016  . Plantar fasciitis, bilateral 08/05/2016  . 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 Elevated parathyroid hormone 07/03/2016  . 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 Tachycardia 05/10/2015  . Hyperlipidemia, mixed 03/28/2014    Allergies  Allergen Reactions  . Codeine Shortness Of Breath  . Hydrocodone-Acetaminophen Rash  . Aspirin Other (See Comments)    Stomach issues  . Diphenhydramine Hcl Other (See Comments)    Through an I.V. Drip in addition to other meds. Caused hallucinations  .  Adhesive [Tape] Rash    ELECTRODES FROM HOLTER MONITOR CAUSED RASH    Past Surgical History:  Procedure Laterality Date  . ABDOMINAL HYSTERECTOMY Bilateral 07/02/2015   Procedure: HYSTERECTOMY ABDOMINAL WITH BS;  Surgeon: Brayton Mars, MD;  Location: [facility];  Service: Gynecology;  Laterality: Bilateral;  . APPENDECTOMY    . CARDIAC ELECTROPHYSIOLOGY STUDY AND ABLATION    . CHOLECYSTECTOMY    . COLONOSCOPY  2013  . COLONOSCOPY WITH PROPOFOL N/A 03/10/2017   Procedure: COLONOSCOPY WITH PROPOFOL;  Surgeon: Lin Landsman, MD;  Location: [place];  Service: Endoscopy;  Laterality: N/A;  . CYSTOSCOPY Bilateral 07/02/2015   Procedure: CYSTOSCOPY;  Surgeon: Brayton Mars, MD;  Location: [facility];  Service: Gynecology;  Laterality: Bilateral;  . ESOPHAGEAL DILATION  03/10/2017   Procedure: ESOPHAGEAL DILATION;  Surgeon: Lin Landsman, MD;  Location: [place];  Service: Endoscopy;;  . ESOPHAGOGASTRODUODENOSCOPY N/A 03/10/2017   Procedure: ESOPHAGOGASTRODUODENOSCOPY (EGD);  Surgeon: Lin Landsman, MD;  Location: [place];  Service: Endoscopy;  Laterality: N/A;  . LAPAROTOMY N/A 07/11/2015   Procedure: hematoma evacuation;  Surgeon: Brayton Mars, MD;  Location: [facility];  Service: Gynecology;  Laterality: N/A;  . NISSEN FUNDOPLICATION    . TUBAL LIGATION      Social History   Tobacco Use  . Smoking status: Never Smoker  . Smokeless tobacco: Never Used  Substance Use Topics  . Alcohol use: No  . Drug use: No     Medication list has been reviewed and updated.  Current Meds  Medication Sig  . atorvastatin (LIPITOR) 10  MG tablet TAKE 1 TABLET(10 MG) BY MOUTH DAILY AT 6 PM  . Biotin 1 MG CAPS Take by mouth.  . Multiple Vitamin (MULTIVITAMIN) tablet Take 1 tablet by mouth daily.  Marland Kitchen omeprazole (PRILOSEC) 40 MG capsule TAKE ONE CAPSULE BY MOUTH EVERY DAY (Patient taking differently: as needed. )  . propranolol (INDERAL) 20  MG tablet TAKE 1 TABLET BY MOUTH EVERY DAY AS NEEDED  . propranolol ER (INDERAL LA) 80 MG 24 hr capsule TAKE ONE CAPSULE BY MOUTH TWICE DAILY    PHQ 2/9 Scores 10/06/2017 07/03/2016 02/13/2016  PHQ - 2 Score 0 0 0    Physical Exam  Constitutional: She is oriented to person, place, and time. She appears well-developed. No distress.  HENT:  Head: Normocephalic and atraumatic.  Cardiovascular: Normal rate, regular rhythm and normal heart sounds.  Pulmonary/Chest: Effort normal and breath sounds normal. No respiratory distress.  Musculoskeletal: Normal range of motion.  Neurological: She is alert and oriented to person, place, and time.  Skin: Skin is warm and dry. Rash noted.  Mild erythema in groin folds bilaterally, few satellite lesions seen No vaginal discharge. No labial lesions.  Psychiatric: She has a normal mood and affect. Her behavior is normal. Thought content normal.  Nursing note and vitals reviewed.   BP 104/78 (BP Location: Right Arm, Patient Position: Sitting, Cuff Size: Large)   Pulse 70   Ht 5\' 6"  (1.676 m)   Wt 207 lb (93.9 kg)   LMP 06/26/2015 (Exact Date)   SpO2 98%   BMI 33.41 kg/m   Assessment and Plan: 1. Tinea corporis Continue topical cortisone/cornstarch powder - fluconazole (DIFLUCAN) 100 MG tablet; Take 1 tablet (100 mg total) by mouth daily for 3 days.  Dispense: 3 tablet; Refill: 0  2. Tachycardia Continue current therapy Pt prefers to see a Cardiologist closer to home - Ambulatory referral to Cardiology   Meds ordered this encounter  Medications  . fluconazole (DIFLUCAN) 100 MG tablet    Sig: Take 1 tablet (100 mg total) by mouth daily for 3 days.    Dispense:  3 tablet    Refill:  0    Partially dictated using Editor, commissioning. Any errors are unintentional.  Halina Maidens, MD [facility]  01/28/2018   There are no diagnoses linked to this encounter.

## 2018-02-02 ENCOUNTER — Encounter: Payer: Self-pay | Admitting: Internal Medicine

## 2018-02-03 ENCOUNTER — Encounter: Payer: Self-pay | Admitting: Obstetrics and Gynecology

## 2018-02-03 ENCOUNTER — Ambulatory Visit (INDEPENDENT_AMBULATORY_CARE_PROVIDER_SITE_OTHER): Payer: 59 | Admitting: Obstetrics and Gynecology

## 2018-02-03 VITALS — BP 106/75 | HR 79 | Ht 66.0 in | Wt 202.4 lb

## 2018-02-03 DIAGNOSIS — N762 Acute vulvitis: Secondary | ICD-10-CM

## 2018-02-03 DIAGNOSIS — B373 Candidiasis of vulva and vagina: Secondary | ICD-10-CM

## 2018-02-03 DIAGNOSIS — B3731 Acute candidiasis of vulva and vagina: Secondary | ICD-10-CM

## 2018-02-03 NOTE — Patient Instructions (Signed)
1.  Clinical history and exam is consistent with probable yeast infection that has been adequately treated. 2.  The vulvar skin changes will likely return to normal over several weeks. 3.  If recurrent vulvar itching and vaginal discharge develop, return for reassessment. 4.  I recommend having hemoglobin A1c and blood sugar check at next physical to assess for potential prediabetes or diabetes that could potentially be associated with recurrent yeast infections.

## 2018-02-03 NOTE — Progress Notes (Signed)
GYN ENCOUNTER NOTE  Subjective:       Brittany Baldwin is a 47 y.o. [phone] female is here for gynecologic evaluation of the following issues:  1.  Vulvar rash 2.  Vulvar pruritus.    Patient presents for evaluation of vaginitis which began approximately 9 days ago.  She developed vulvar itching followed by subsequent rash on the bilateral labia majora.  She never had a significant vaginal discharge.  She self treated with Benadryl oral medication without relief of symptoms.  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  She continues to have vulvar rash.  Patient does not have any history of glucose intolerance.  HIV testing in past several years was negative. Patient has not had any new partners sexually recently.    Gynecologic History Patient's last menstrual period was 06/26/2015 (exact date). Contraception: Status post TAH bilateral salpingectomy in 2017 History of Mcdowell cerclage x4 History of Shirodkar cerclage History of hysterotomy for IUFD History of cesarean section delivery and BTL  Obstetric History OB History  Gravida Para Term Preterm AB Living  8 3 1 2 5 1   SAB TAB Ectopic Multiple Live Births  4 1     1     # Outcome Date GA Lbr Len/2nd Weight Sex Delivery Anes PTL Lv  8 SAB 2003          7 Preterm 2001        FD  6 Term 1998   4 lb 2.1 oz (1.873 kg) F Vag-Spont   LIV  5 Preterm 1996        FD  4 SAB 1994          3 SAB 1992          2 TAB 1991          1 SAB 1990            Past Medical History:  Diagnosis Date  . Anemia    H/O   . Anxiety   . Dysrhythmia    SVT-HAD CARDIAC ABLATION DONE IN 2016 BUT PT STATES SHE STILL GETS INTERMITTENT TACHYCARDIC  AND IS SYPMPTOMATIC WITH SOB, DIZZINESS DURING TACHY EPISODES (06-25-15)  . Fibroid   . GERD (gastroesophageal reflux disease)   . Headache    H/O  . Heart murmur    as child  . History of fundoplication 0/35/5974  . History of pulmonary embolus during pregnancy 05/16/2015   2001  . Hyperlipemia   . Motion sickness    cars, boats  . PE (pulmonary thromboembolism) ([place]) 2000   while on bed rest during pregnancy  . PONV (postoperative nausea and vomiting)   . Shortness of breath dyspnea    ASSOCIATED WITH TACHYCARDIA  . Status post abdominal hysterectomy 07/02/2015   Status post TAH, bilateral salpingectomy. PATHOLOGY: Uterine fibroids and adenomyosis   . Tachycardia     Past Surgical History:  Procedure Laterality Date  . ABDOMINAL HYSTERECTOMY Bilateral 07/02/2015   Procedure: HYSTERECTOMY ABDOMINAL WITH BS;  Surgeon: Brayton Mars, MD;  Location: [facility];  Service: Gynecology;  Laterality: Bilateral;  . APPENDECTOMY    . CARDIAC ELECTROPHYSIOLOGY STUDY AND ABLATION    . CHOLECYSTECTOMY    . COLONOSCOPY  2013  .  COLONOSCOPY WITH PROPOFOL N/A 03/10/2017   Procedure: COLONOSCOPY WITH PROPOFOL;  Surgeon: Lin Landsman, MD;  Location: [facility];  Service: Endoscopy;  Laterality: N/A;  . CYSTOSCOPY Bilateral 07/02/2015   Procedure: CYSTOSCOPY;  Surgeon: Brayton Mars, MD;  Location: [facility];  Service: Gynecology;  Laterality: Bilateral;  . ESOPHAGEAL DILATION  03/10/2017   Procedure: ESOPHAGEAL DILATION;  Surgeon: Lin Landsman, MD;  Location: [facility];  Service: Endoscopy;;  . ESOPHAGOGASTRODUODENOSCOPY N/A 03/10/2017   Procedure: ESOPHAGOGASTRODUODENOSCOPY (EGD);  Surgeon: Lin Landsman, MD;  Location: [facility];  Service: Endoscopy;  Laterality: N/A;  . LAPAROTOMY N/A 07/11/2015   Procedure: hematoma evacuation;  Surgeon: Brayton Mars, MD;  Location: [facility];  Service: Gynecology;  Laterality: N/A;  . NISSEN  FUNDOPLICATION    . TUBAL LIGATION      Current Outpatient Medications on File Prior to Visit  Medication Sig Dispense Refill  . atorvastatin (LIPITOR) 10 MG tablet TAKE 1 TABLET(10 MG) BY MOUTH DAILY AT 6 PM 90 tablet 0  . Biotin 1 MG CAPS Take by mouth.    . Multiple Vitamin (MULTIVITAMIN) tablet Take 1 tablet by mouth daily.    Marland Kitchen omeprazole (PRILOSEC) 40 MG capsule TAKE ONE CAPSULE BY MOUTH EVERY DAY (Patient taking differently: as needed. ) 90 capsule 0  . propranolol (INDERAL) 20 MG tablet TAKE 1 TABLET BY MOUTH EVERY DAY AS NEEDED 90 tablet 0  . propranolol ER (INDERAL LA) 80 MG 24 hr capsule TAKE ONE CAPSULE BY MOUTH TWICE DAILY 180 capsule 0   No current facility-administered medications on file prior to visit.     Allergies  Allergen Reactions  . Codeine Shortness Of Breath  . Hydrocodone-Acetaminophen Rash  . Aspirin Other (See Comments)    Stomach issues  . Diphenhydramine Hcl Other (See Comments)    Through an I.V. Drip in addition to other meds. Caused hallucinations  . Adhesive [Tape] Rash    ELECTRODES FROM HOLTER MONITOR CAUSED RASH    Social History   Socioeconomic History  . Marital status: Married    Spouse name: Not on file  . Number of children: Not on file  . Years of education: Not on file  . Highest education level: Not on file  Occupational History  . Not on file  Social Needs  . Financial resource strain: Not on file  . Food insecurity:    Worry: Not on file    Inability: Not on file  . Transportation needs:    Medical: Not on file    Non-medical: Not on file  Tobacco Use  . Smoking status: Never Smoker  . Smokeless tobacco: Never Used  Substance and Sexual Activity  . Alcohol use: No  . Drug use: No  . Sexual activity: Yes    Birth control/protection: Surgical  Lifestyle  . Physical activity:    Days per week: Not on file    Minutes per session: Not on file  . Stress: Not on file  Relationships  . Social connections:    Talks on  phone: Not on file    Gets together: Not on file    Attends religious service: Not on file    Active member of club or organization: Not on file    Attends meetings of clubs or organizations: Not on file    Relationship status: Not on file  . Intimate partner violence:    Fear of current or ex partner: Not on file  Emotionally abused: Not on file    Physically abused: Not on file    Forced sexual activity: Not on file  Other Topics Concern  . Not on file  Social History Narrative  . Not on file    Family History  Problem Relation Age of Onset  . Diabetes Mother   . Prostate cancer Father   . Congestive Heart Failure Father   . Heart failure Father   . Cancer Neg Hx     The following portions of the patient's history were reviewed and updated as appropriate: allergies, current medications, past family history, past medical history, past social history, past surgical history and problem list.  Review of Systems Review of Systems  Constitutional: Negative for chills, diaphoresis and fever.  Respiratory: Negative.   Cardiovascular: Negative.   Gastrointestinal: Negative.   Genitourinary: Positive for frequency. Negative for dysuria, flank pain and urgency.  Musculoskeletal: Negative.   Skin: Positive for itching and rash.       Vulvar  Psychiatric/Behavioral: Negative.      Objective:   BP 106/75   Pulse 79   Ht 5\' 6"  (1.676 m)   Wt 202 lb 6.4 oz (91.8 kg)   LMP 06/26/2015 (Exact Date)   BMI 32.67 kg/m  CONSTITUTIONAL: Well-developed, well-nourished female in no acute distress.  HENT:  Normocephalic, atraumatic.  NECK: Normal range of motion, supple, no masses.  Normal thyroid.  SKIN: Skin is warm and dry. No rash noted. Not diaphoretic. No erythema. No pallor. [place]: Alert and oriented to person, place, and time.  PSYCHIATRIC: Normal mood and affect. Normal behavior. Normal judgment and thought content. CARDIOVASCULAR:Not Examined RESPIRATORY: Not  Examined BREASTS: Not Examined ABDOMEN: Soft, non distended; Non tender.  No Organomegaly. PELVIC:  External Genitalia: Mild epithelial hypertrophy of the labia majora (chronic skin change, symmetric, bilateral)  BUS: Normal  Vagina: Good vault support; no significant discharge  Cervix: Surgically absent  Uterus: Surgically absent  Adnexa: Nonpalpable nontender  RV: Normal external exam  Bladder: Nontender MUSCULOSKELETAL: Normal range of motion. No tenderness.  No cyanosis, clubbing, or edema.     Assessment:   1. Acute vulvitis, status post treatment, resolving  2. Monilial vulvitis, status post treatment (Diflucan 100 mg daily x3 days); suspect resolving infection     Plan:   1.  Reassurance is given regarding diagnosis and management of monilia vulvovaginitis 2.  Recommend reassessment if symptoms recur within the next 7 to 10 days 3.  Consider getting testing for glucose intolerance/diabetes at next annual exam  A total of 15 minutes were spent face-to-face with the patient during this encounter and over half of that time dealt with counseling and coordination of care.  Brayton Mars, MD  Note: This dictation was prepared with Dragon dictation along with smaller phrase technology. Any transcriptional errors that result from this process are unintentional.

## 2018-02-11 ENCOUNTER — Encounter: Payer: 59 | Admitting: Internal Medicine

## 2018-03-01 ENCOUNTER — Encounter

## 2018-03-09 ENCOUNTER — Ambulatory Visit: Payer: 59 | Admitting: Internal Medicine

## 2018-03-09 ENCOUNTER — Other Ambulatory Visit: Payer: Self-pay | Admitting: Internal Medicine

## 2018-03-09 DIAGNOSIS — E782 Mixed hyperlipidemia: Secondary | ICD-10-CM

## 2018-04-05 ENCOUNTER — Telehealth: Payer: Self-pay

## 2018-04-05 NOTE — Telephone Encounter (Signed)
Patient needs to call insurance and find out what is in network for cardiology and call back with list of providers or locations so we can place new referral. Patient can leave detailed message on my phone of names of providers. If she has chest pain, SOB, or slurred speech she needs to go to ER. Thank you. Please Advise.

## 2018-04-05 NOTE — Telephone Encounter (Signed)
Patient said she had Tachycardia episode Sat night after missing her AM dose of Propranolol. She wondered if it was caused by missed dose or if it was caused by exciting Transformer movie she saw? She asked should she come in for EKG and states it has been 2 years since anyone looked at heart or did any work up. She said when she was sent to Cardiology they called her day of to cancel since her insurance and secondary insurance would not cover visit. I asked had she asked ins what Cardio to go to she said no. I also asked had she told PCP that she can not see Cardio and she said no she just assumed we would know. Does she come in for Tachy visit from Saturday night or do we try a different cardiology? She is feeling fine now but given the episode she wonders if it is something worse than missing dose of meds.

## 2018-04-05 NOTE — Telephone Encounter (Signed)
Advised 

## 2018-04-12 ENCOUNTER — Other Ambulatory Visit: Payer: Self-pay

## 2018-04-12 ENCOUNTER — Ambulatory Visit
Admission: EM | Admit: 2018-04-12 | Discharge: 2018-04-12 | Disposition: A | Discharge status: Home / Self Care | Payer: Managed Care, Other (non HMO) | Attending: Family Medicine | Admitting: Family Medicine

## 2018-04-12 ENCOUNTER — Encounter: Payer: Self-pay | Admitting: Emergency Medicine

## 2018-04-12 DIAGNOSIS — R002 Palpitations: Secondary | ICD-10-CM

## 2018-04-12 DIAGNOSIS — Z8679 Personal history of other diseases of the circulatory system: Secondary | ICD-10-CM

## 2018-04-12 NOTE — Discharge Instructions (Addendum)
Follow up with cardiologist as scheduled

## 2018-04-12 NOTE — ED Provider Notes (Signed)
[facility]    CSN: [id number] Arrival date & time: 04/12/18  1024     History   Chief Complaint Chief Complaint  Patient presents with  . Palpitations    HPI Brittany Baldwin is a 47 y.o. female.   47 yo female with h/o SVT s/p ablation in 2016 presents with a c/o an episode of palpitations one week ago and wanting to get checked out. Denies any further episodes since one week ago. States she takes propranolol and that she has an appointment with the cardiologist at the end of this month. Currently denies any palpitations, chest pains, shortness of breath.   The history is provided by the patient.  Palpitations    Past Medical History:  Diagnosis Date  . Anemia    H/O   . Anxiety   . Dysrhythmia    SVT-HAD CARDIAC ABLATION DONE IN 2016 BUT PT STATES SHE STILL GETS INTERMITTENT TACHYCARDIC AND IS SYPMPTOMATIC WITH SOB, DIZZINESS DURING TACHY EPISODES (06-25-15)  . Fibroid   . GERD (gastroesophageal reflux disease)   . Headache    H/O  . Heart murmur    as child  . History of fundoplication 2/37/6283  . History of pulmonary embolus during pregnancy 05/16/2015   2001  . Hyperlipemia   . Motion sickness    cars, boats  . PE (pulmonary thromboembolism) ([place]) 2000   while on bed rest during pregnancy  . PONV (postoperative nausea and vomiting)   . Shortness of breath dyspnea    ASSOCIATED WITH TACHYCARDIA  . Status post abdominal hysterectomy 07/02/2015   Status post TAH, bilateral salpingectomy. PATHOLOGY: Uterine fibroids and adenomyosis   . Tachycardia     Patient Active Problem List   Diagnosis Date Noted  . Acute vulvitis 02/03/2018  . Monilial vulvitis 02/03/2018  . Hoarseness, persistent 10/06/2017  . Esophageal dysphagia   . Functional dyspepsia 02/20/2017  . Hypercalcemia 01/18/2017  . Muscle spasm of right shoulder 08/05/2016  . Dependent edema 08/05/2016  . Plantar fasciitis, bilateral 08/05/2016  . Constipation by delayed colonic transit  08/05/2016  . Elevated parathyroid hormone 07/03/2016  . 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 Tachycardia 05/10/2015  . Hyperlipidemia, mixed 03/28/2014    Past Surgical History:  Procedure Laterality Date  . ABDOMINAL HYSTERECTOMY Bilateral 07/02/2015   Procedure: HYSTERECTOMY ABDOMINAL WITH BS;  Surgeon: Brayton Mars, MD;  Location: [facility];  Service: Gynecology;  Laterality: Bilateral;  . APPENDECTOMY    . CARDIAC ELECTROPHYSIOLOGY STUDY AND ABLATION    . CHOLECYSTECTOMY    . COLONOSCOPY  2013  . COLONOSCOPY WITH PROPOFOL N/A 03/10/2017   Procedure: COLONOSCOPY WITH PROPOFOL;  Surgeon: Lin Landsman, MD;  Location: [facility];  Service: Endoscopy;  Laterality: N/A;  . CYSTOSCOPY Bilateral 07/02/2015   Procedure: CYSTOSCOPY;  Surgeon: Brayton Mars, MD;  Location: [facility];  Service: Gynecology;  Laterality: Bilateral;  . ESOPHAGEAL DILATION  03/10/2017   Procedure: ESOPHAGEAL DILATION;  Surgeon: Lin Landsman, MD;  Location: [facility];  Service: Endoscopy;;  . ESOPHAGOGASTRODUODENOSCOPY N/A 03/10/2017   Procedure: ESOPHAGOGASTRODUODENOSCOPY (EGD);  Surgeon: Lin Landsman, MD;  Location: [facility];  Service: Endoscopy;  Laterality: N/A;  . LAPAROTOMY N/A 07/11/2015   Procedure: hematoma evacuation;  Surgeon: Brayton Mars, MD;  Location: [facility];  Service: Gynecology;  Laterality: N/A;  . NISSEN FUNDOPLICATION    . TUBAL LIGATION      OB History    Gravida  8   Para  3   Term  1   Preterm  2   AB  5   Living  1     SAB  4   TAB  1   Ectopic      Multiple      Live Births  1            Home Medications    Prior to Admission medications   Medication Sig Start Date End Date Taking? Authorizing Provider  atorvastatin (LIPITOR) 10 MG tablet TAKE 1 TABLET(10 MG) BY MOUTH DAILY AT 6 PM 03/09/18  Yes Glean Hess, MD  Biotin 1 MG CAPS Take by mouth.   Yes [provider]  Multiple  Vitamin (MULTIVITAMIN) tablet Take 1 tablet by mouth daily.   Yes [provider]  omeprazole (PRILOSEC) 40 MG capsule TAKE ONE CAPSULE BY MOUTH EVERY DAY Patient taking differently: as needed.  05/13/17  Yes Vanga, Tally Due, MD  propranolol (INDERAL) 20 MG tablet TAKE 1 TABLET BY MOUTH EVERY DAY AS NEEDED 01/23/18  Yes Glean Hess, MD  propranolol ER (INDERAL LA) 80 MG 24 hr capsule TAKE ONE CAPSULE BY MOUTH TWICE DAILY 03/09/18  Yes Glean Hess, MD    Family History Family History  Problem Relation Age of Onset  . Diabetes Mother   . Prostate cancer Father   . Congestive Heart Failure Father   . Heart failure Father   . Cancer Neg Hx     Social History Social History   Tobacco Use  . Smoking status: Never Smoker  . Smokeless tobacco: Never Used  Substance Use Topics  . Alcohol use: No  . Drug use: No     Allergies   Codeine; Hydrocodone-acetaminophen; Aspirin; Diphenhydramine hcl; and Adhesive [tape]   Review of Systems Review of Systems  Cardiovascular: Positive for palpitations.     Physical Exam Triage Vital Signs ED Triage Vitals  Enc Vitals Group     BP 04/12/18 1037 121/85     Pulse Rate 04/12/18 1037 87     Resp 04/12/18 1037 18     Temp 04/12/18 1037 98.2 F (36.8 C)     Temp Source 04/12/18 1037 Oral     SpO2 04/12/18 1037 100 %     Weight 04/12/18 1033 206 lb (93.4 kg)     Height 04/12/18 1033 5\' 6"  (1.676 m)     Head Circumference --      Peak Flow --      Pain Score 04/12/18 1033 0     Pain Loc --      Pain Edu? --      Excl. in [place]? --    No data found.  Updated Vital Signs BP 121/85 (BP Location: Left Arm)   Pulse 87   Temp 98.2 F (36.8 C) (Oral)   Resp 18   Ht 5\' 6"  (1.676 m)   Wt 93.4 kg   LMP 06/26/2015 (Exact Date)   SpO2 100%   BMI 33.25 kg/m   Visual Acuity Right Eye Distance:   Left Eye Distance:   Bilateral Distance:    Right Eye Near:   Left Eye Near:    Bilateral Near:     Physical Exam   Constitutional: She appears well-developed and well-nourished. No distress.  Neck: Normal range of motion. Neck supple. No thyromegaly present.  Cardiovascular: Normal rate, regular rhythm and normal heart sounds.  Pulmonary/Chest: Effort normal and breath sounds normal. No stridor. No respiratory distress. She  has no wheezes. She has no rales.  Lymphadenopathy:    She has no cervical adenopathy.  Skin: She is not diaphoretic.  Nursing note and vitals reviewed.    UC Treatments / Results  Labs (all labs ordered are listed, but only abnormal results are displayed) Labs Reviewed - No data to display  EKG None  Radiology No results found.  Procedures ED EKG Date/Time: 04/12/2018 11:10 AM Performed by: Norval Gable, MD Authorized by: Norval Gable, MD   ECG reviewed by ED Physician in the absence of a cardiologist: yes   Previous ECG:    Previous ECG:  Unavailable Interpretation:    Interpretation: normal   Rate:    ECG rate:  88   ECG rate assessment: normal   Rhythm:    Rhythm: sinus rhythm   Ectopy:    Ectopy: none   QRS:    QRS axis:  Normal Conduction:    Conduction: normal   ST segments:    ST segments:  Normal T waves:    T waves: normal     (including critical care time)  Medications Ordered in UC Medications - No data to display  Initial Impression / Assessment and Plan / UC Course  I have reviewed the triage vital signs and the nursing notes.  Pertinent labs & imaging results that were available during my care of the patient were reviewed by me and considered in my medical decision making (see chart for details).      Final Clinical Impressions(s) / UC Diagnoses   Final diagnoses:  H/O supraventricular tachycardia  Palpitations     Discharge Instructions     Follow up with cardiologist as scheduled    ED Prescriptions    None      1. ekg results (normal) and diagnosis reviewed with patient 2. Follow up with cardiologist 3.  F/u prn   Controlled Substance Prescriptions [place] Controlled Substance Registry consulted? Not Applicable   Norval Gable, MD 04/12/18 1120

## 2018-04-12 NOTE — ED Triage Notes (Signed)
Patient c/o palpitations and shortness of breath that started 2 weeks ago. Patient states she has a history of tachycardia and has been followed by cardiology but has not seen them in 2 years. She does have an appointment at the end of the month but due to the recent palpitations and SOB she wanted to be evaluated.

## 2018-04-23 ENCOUNTER — Telehealth: Payer: Self-pay

## 2018-04-23 NOTE — Telephone Encounter (Signed)
Rcvd  Request from C

## 2018-05-09 ENCOUNTER — Other Ambulatory Visit: Payer: Self-pay | Admitting: Internal Medicine

## 2018-06-14 ENCOUNTER — Other Ambulatory Visit: Payer: Self-pay | Admitting: Internal Medicine

## 2018-06-14 DIAGNOSIS — E782 Mixed hyperlipidemia: Secondary | ICD-10-CM

## 2018-06-18 ENCOUNTER — Other Ambulatory Visit: Payer: Self-pay | Admitting: Internal Medicine

## 2018-06-18 DIAGNOSIS — K30 Functional dyspepsia: Secondary | ICD-10-CM

## 2018-06-23 ENCOUNTER — Ambulatory Visit (INDEPENDENT_AMBULATORY_CARE_PROVIDER_SITE_OTHER): Payer: Managed Care, Other (non HMO) | Admitting: Internal Medicine

## 2018-06-23 ENCOUNTER — Encounter: Payer: Self-pay | Admitting: Internal Medicine

## 2018-06-23 VITALS — BP 128/78 | HR 85 | Ht 66.0 in | Wt 197.0 lb

## 2018-06-23 DIAGNOSIS — E782 Mixed hyperlipidemia: Secondary | ICD-10-CM

## 2018-06-23 DIAGNOSIS — Z0000 Encounter for general adult medical examination without abnormal findings: Secondary | ICD-10-CM | POA: Diagnosis not present

## 2018-06-23 DIAGNOSIS — R000 Tachycardia, unspecified: Secondary | ICD-10-CM

## 2018-06-23 DIAGNOSIS — Z1231 Encounter for screening mammogram for malignant neoplasm of breast: Secondary | ICD-10-CM | POA: Diagnosis not present

## 2018-06-23 LAB — POCT URINALYSIS DIPSTICK
Bilirubin, UA: NEGATIVE
Glucose, UA: NEGATIVE
Ketones, UA: NEGATIVE
Leukocytes, UA: NEGATIVE
NITRITE UA: NEGATIVE
Protein, UA: NEGATIVE
Spec Grav, UA: 1.01 (ref 1.010–1.025)
UROBILINOGEN UA: 0.2 U/dL
pH, UA: 6 (ref 5.0–8.0)

## 2018-06-23 MED ORDER — ATORVASTATIN CALCIUM 10 MG PO TABS: 10.0000 mg | ORAL_TABLET | Freq: Every day | ORAL | 3 refills | Status: DC

## 2018-06-23 MED ORDER — PROPRANOLOL HCL ER 80 MG PO CP24: 80.0000 mg | ORAL_CAPSULE | Freq: Two times a day (BID) | ORAL | 3 refills | Status: DC

## 2018-06-23 MED ORDER — PROPRANOLOL HCL 20 MG PO TABS: 20.0000 mg | ORAL_TABLET | Freq: Every day | ORAL | 3 refills | Status: DC | PRN

## 2018-06-23 NOTE — Patient Instructions (Signed)

## 2018-06-23 NOTE — Progress Notes (Signed)
Date:  06/23/2018   Name:  Brittany Baldwin   DOB:  27-Oct-1970   MRN:  [id number]   Chief Complaint: Annual Exam (Breast Exam. ONLY WANTS PREVENTATIVE VISIT. ) Brittany Baldwin is a 48 y.o. female who presents today for her Complete Annual Exam. She feels well. She reports exercising regularly. She reports she is sleeping well - takes melatonin as needed. She is due for a mammogram.  She denies breast issues. She declines a flu vaccine. Colonoscopy was done in 2018 - repeat 10 yrs.  Hyperlipidemia  This is a chronic problem. The problem is controlled. Pertinent negatives include no chest pain or shortness of breath. Current antihyperlipidemic treatment includes statins. The current treatment provides significant improvement of lipids. There are no compliance problems.    Tachycardia - s/p ablation, followed by cardiology.  On beta blocker daily and PRN.  Review of Systems  Constitutional: Negative for chills, fatigue and fever.  HENT: Negative for congestion, hearing loss, tinnitus, trouble swallowing and voice change.   Eyes: Negative for visual disturbance.  Respiratory: Negative for cough, chest tightness, shortness of breath and wheezing.   Cardiovascular: Positive for palpitations (very rarely). Negative for chest pain and leg swelling.  Gastrointestinal: Negative for abdominal pain, constipation, diarrhea and vomiting.  Endocrine: Negative for polydipsia and polyuria.  Genitourinary: Negative for dysuria, frequency, genital sores, vaginal bleeding and vaginal discharge.  Musculoskeletal: Negative for arthralgias, gait problem and joint swelling.  Skin: Negative for color change and rash.  Allergic/Immunologic: Negative for environmental allergies.  Neurological: Negative for dizziness, tremors, light-headedness and headaches.  Hematological: Negative for adenopathy. Does not bruise/bleed easily.  Psychiatric/Behavioral: Negative for dysphoric mood and sleep disturbance. The patient is not  nervous/anxious.     Patient Active Problem List   Diagnosis Date Noted  . Hoarseness, persistent 10/06/2017  . Esophageal dysphagia   . Functional dyspepsia 02/20/2017  . Hypercalcemia 01/18/2017  . Muscle spasm of right shoulder 08/05/2016  . Dependent edema 08/05/2016  . Plantar fasciitis, bilateral 08/05/2016  . 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 Elevated parathyroid hormone 07/03/2016  . 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 Tachycardia 05/10/2015  . Hyperlipidemia, mixed 03/28/2014    Allergies  Allergen Reactions  . Codeine Shortness Of Breath  . Hydrocodone-Acetaminophen Rash  . Aspirin Other (See Comments)    Stomach issues  . Diphenhydramine Hcl Other (See Comments)    Through an I.V. Drip in addition to other meds. Caused hallucinations  . Adhesive [Tape] Rash    ELECTRODES FROM HOLTER MONITOR CAUSED RASH    Past Surgical History:  Procedure Laterality Date  . ABDOMINAL HYSTERECTOMY Bilateral 07/02/2015   Procedure: HYSTERECTOMY ABDOMINAL WITH BS;  Surgeon: Brayton Mars, MD;  Location: [facility];  Service: Gynecology;  Laterality: Bilateral;  . APPENDECTOMY    . CARDIAC ELECTROPHYSIOLOGY STUDY AND ABLATION    . CHOLECYSTECTOMY    . COLONOSCOPY  2013  . COLONOSCOPY WITH PROPOFOL N/A 03/10/2017   Procedure: COLONOSCOPY WITH PROPOFOL;  Surgeon: Lin Landsman, MD;  Location: [place];  Service: Endoscopy;  Laterality: N/A;  . CYSTOSCOPY Bilateral 07/02/2015   Procedure: CYSTOSCOPY;  Surgeon: Brayton Mars, MD;  Location: [facility];  Service: Gynecology;  Laterality: Bilateral;  . ESOPHAGEAL DILATION  03/10/2017   Procedure: ESOPHAGEAL DILATION;  Surgeon: Lin Landsman, MD;  Location: [place];  Service: Endoscopy;;  . ESOPHAGOGASTRODUODENOSCOPY N/A 03/10/2017   Procedure: ESOPHAGOGASTRODUODENOSCOPY (EGD);  Surgeon: Lin Landsman, MD;  [facility];  Service: Endoscopy;  Laterality: N/A;   . LAPAROTOMY N/A 07/11/2015   Procedure: hematoma evacuation;  Surgeon: Brayton Mars, MD;  Location: [facility];  Service: Gynecology;  Laterality: N/A;  . NISSEN FUNDOPLICATION    . TUBAL LIGATION      Social History   Tobacco Use  . Smoking status: Never Smoker  . Smokeless tobacco: Never Used  Substance Use Topics  . Alcohol use: No  . Drug use: No     Medication list has been reviewed and updated.  Current Meds  Medication Sig  . atorvastatin (LIPITOR) 10 MG tablet TAKE 1 TABLET(10 MG) BY MOUTH DAILY AT 6 PM  . Biotin 1 MG CAPS Take by mouth.  . Multiple Vitamin (MULTIVITAMIN) tablet Take 1 tablet by mouth daily.  . propranolol (INDERAL) 20 MG tablet TAKE 1 TABLET BY MOUTH EVERY DAY AS NEEDED  . propranolol ER (INDERAL LA) 80 MG 24 hr capsule TAKE ONE CAPSULE BY MOUTH TWICE DAILY    PHQ 2/9 Scores 06/23/2018 10/06/2017 07/03/2016 02/13/2016  PHQ - 2 Score 0 0 0 0    Physical Exam Vitals signs and nursing note reviewed.  Constitutional:      General: She is not in acute distress.    Appearance: She is well-developed.  HENT:     Head: Normocephalic and atraumatic.     Right Ear: Tympanic membrane and ear canal normal.     Left Ear: Tympanic membrane and ear canal normal.     Nose:     Right Sinus: No maxillary sinus tenderness.     Left Sinus: No maxillary sinus tenderness.     Mouth/Throat:     Pharynx: Uvula midline.  Eyes:     General: No scleral icterus.       Right eye: No discharge.        Left eye: No discharge.     Conjunctiva/sclera: Conjunctivae normal.  Neck:     Musculoskeletal: Normal range of motion. No erythema.     Thyroid: No thyromegaly.     Vascular: No carotid bruit.  Cardiovascular:     Rate and Rhythm: Normal rate and regular rhythm.  No extrasystoles are present.    Pulses: Normal pulses.     Heart sounds: Normal heart sounds.  Pulmonary:     Effort: Pulmonary effort is normal. No respiratory distress.     Breath sounds: No  wheezing.  Chest:     Breasts:        Right: No mass, nipple discharge, skin change or tenderness.        Left: No mass, nipple discharge, skin change or tenderness.  Abdominal:     General: Bowel sounds are normal.     Palpations: Abdomen is soft.     Tenderness: There is no abdominal tenderness.  Musculoskeletal: Normal range of motion.     Right lower leg: No edema.     Left lower leg: No edema.  Lymphadenopathy:     Cervical: No cervical adenopathy.  Skin:    General: Skin is warm and dry.     Findings: No rash.  Neurological:     Mental Status: She is alert and oriented to person, place, and time.     Cranial Nerves: No cranial nerve deficit.     Sensory: No sensory deficit.     Deep Tendon Reflexes: Reflexes are normal and symmetric.  Psychiatric:        Speech: Speech normal.  Behavior: Behavior normal.        Thought Content: Thought content normal.    Wt Readings from Last 3 Encounters:  06/23/18 197 lb (89.4 kg)  04/12/18 206 lb (93.4 kg)  02/03/18 202 lb 6.4 oz (91.8 kg)     BP 128/78   Pulse 85   Ht 5\' 6"  (1.676 m)   Wt 197 lb (89.4 kg)   LMP 06/26/2015 (Exact Date)   SpO2 99%   BMI 31.80 kg/m   Assessment and Plan: 1. Annual physical exam Normal exam except for weight - continue diet and exercise - POCT urinalysis dipstick  2. Encounter for screening mammogram for breast cancer Schedule at [place]; Future  3. Tachycardia controlled - CBC with Differential/Platelet - Comprehensive metabolic panel - TSH - propranolol (INDERAL) 20 MG tablet; Take 1 tablet (20 mg total) by mouth daily as needed.  Dispense: 90 tablet; Refill: 3 - propranolol ER (INDERAL LA) 80 MG 24 hr capsule; Take 1 capsule (80 mg total) by mouth 2 (two) times daily.  Dispense: 180 capsule; Refill: 3  4. Hyperlipidemia, mixed On statin therapy - Lipid panel - atorvastatin (LIPITOR) 10 MG tablet; Take 1 tablet (10 mg total) by mouth daily at 6  PM.  Dispense: 90 tablet; Refill: 3   Partially dictated using Editor, commissioning. Any errors are unintentional.  Halina Maidens, MD [facility]  06/23/2018

## 2018-06-24 LAB — COMPREHENSIVE METABOLIC PANEL
ALBUMIN: 4.5 g/dL (ref 3.8–4.8)
ALT: 13 IU/L (ref 0–32)
AST: 16 IU/L (ref 0–40)
Albumin/Globulin Ratio: 2 (ref 1.2–2.2)
Alkaline Phosphatase: 53 IU/L (ref 39–117)
BUN/Creatinine Ratio: 8 — ABNORMAL LOW (ref 9–23)
BUN: 7 mg/dL (ref 6–24)
Bilirubin Total: 0.6 mg/dL (ref 0.0–1.2)
CALCIUM: 9.9 mg/dL (ref 8.7–10.2)
CO2: 22 mmol/L (ref 20–29)
CREATININE: 0.88 mg/dL (ref 0.57–1.00)
Chloride: 104 mmol/L (ref 96–106)
GFR calc Af Amer: 90 mL/min/{1.73_m2} (ref >59)
GFR, EST NON AFRICAN AMERICAN: 78 mL/min/{1.73_m2} (ref >59)
GLUCOSE: 106 mg/dL — AB (ref 65–99)
Globulin, Total: 2.2 g/dL (ref 1.5–4.5)
Potassium: 3.9 mmol/L (ref 3.5–5.2)
SODIUM: 141 mmol/L (ref 134–144)
Total Protein: 6.7 g/dL (ref 6.0–8.5)

## 2018-06-24 LAB — CBC WITH DIFFERENTIAL/PLATELET
Basophils Absolute: 0 10*3/uL (ref 0.0–0.2)
Basos: 1 %
EOS (ABSOLUTE): 0.1 10*3/uL (ref 0.0–0.4)
EOS: 2 %
HEMOGLOBIN: 13.1 g/dL (ref 11.1–15.9)
Hematocrit: 40.2 % (ref 34.0–46.6)
IMMATURE GRANS (ABS): 0 10*3/uL (ref 0.0–0.1)
IMMATURE GRANULOCYTES: 0 %
LYMPHS: 35 %
Lymphocytes Absolute: 1.7 10*3/uL (ref 0.7–3.1)
MCH: 28.4 pg (ref 26.6–33.0)
MCHC: 32.6 g/dL (ref 31.5–35.7)
MCV: 87 fL (ref 79–97)
MONOCYTES: 15 %
Monocytes Absolute: 0.7 10*3/uL (ref 0.1–0.9)
NEUTROS PCT: 47 %
Neutrophils Absolute: 2.3 10*3/uL (ref 1.4–7.0)
Platelets: 287 10*3/uL (ref 150–450)
RBC: 4.61 x10E6/uL (ref 3.77–5.28)
RDW: 13.1 % (ref 11.7–15.4)
WBC: 4.8 10*3/uL (ref 3.4–10.8)

## 2018-06-24 LAB — LIPID PANEL
CHOL/HDL RATIO: 3.6 ratio (ref 0.0–4.4)
Cholesterol, Total: 191 mg/dL (ref 100–199)
HDL: 53 mg/dL (ref >39)
LDL Calculated: 120 mg/dL — ABNORMAL HIGH (ref 0–99)
TRIGLYCERIDES: 91 mg/dL (ref 0–149)
VLDL Cholesterol Cal: 18 mg/dL (ref 5–40)

## 2018-06-24 LAB — TSH: TSH: 2.94 u[IU]/mL (ref 0.450–4.500)

## 2018-06-25 NOTE — Progress Notes (Signed)
Patient informed of labs. Told her to continue the 10 mg of atorvastatin. Will recheck cholesterol next visit and just work on diet until then.

## 2018-07-22 ENCOUNTER — Encounter: Payer: Self-pay | Admitting: Internal Medicine

## 2018-07-26 ENCOUNTER — Ambulatory Visit: Payer: Managed Care, Other (non HMO) | Admitting: Internal Medicine

## 2018-07-30 ENCOUNTER — Ambulatory Visit: Payer: Managed Care, Other (non HMO) | Admitting: Internal Medicine

## 2018-07-30 ENCOUNTER — Encounter: Payer: Self-pay | Admitting: Internal Medicine

## 2018-07-30 ENCOUNTER — Other Ambulatory Visit: Payer: Self-pay

## 2018-07-30 VITALS — BP 118/70 | HR 78 | Ht 66.0 in | Wt 199.0 lb

## 2018-07-30 DIAGNOSIS — L83 Acanthosis nigricans: Secondary | ICD-10-CM | POA: Diagnosis not present

## 2018-07-30 MED ORDER — TRETINOIN 0.05 % EX CREA: TOPICAL_CREAM | Freq: Every day | CUTANEOUS | 0 refills | Status: DC

## 2018-07-30 NOTE — Progress Notes (Signed)
Date:  07/30/2018   Name:  Brittany Baldwin   DOB:  12-Apr-1971   MRN:  [id number]   Chief Complaint: Acanthosis nigricans (Patient complaining of noticing neck skin is becoming darker. Wanted to know if she can try cream her daughter used in past. Had most of life. )  Rash  This is a chronic problem. The problem has been gradually worsening since onset. The affected locations include the neck, left axilla and right axilla. Rash characteristics: dark. She was exposed to nothing. Pertinent negatives include no diarrhea, facial edema, fatigue, joint pain or shortness of breath. Past treatments include moisturizer. The treatment provided no relief.    Review of Systems  Constitutional: Negative for chills and fatigue.  Respiratory: Negative for shortness of breath.   Gastrointestinal: Negative for diarrhea.  Musculoskeletal: Negative for joint pain.  Skin: Positive for rash.  Psychiatric/Behavioral: Negative for sleep disturbance.    Patient Active Problem List   Diagnosis Date Noted  . Hoarseness, persistent 10/06/2017  . Esophageal dysphagia   . Functional dyspepsia 02/20/2017  . Hypercalcemia 01/18/2017  . Muscle spasm of right shoulder 08/05/2016  . Dependent edema 08/05/2016  . Plantar fasciitis, bilateral 08/05/2016  . 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 Elevated parathyroid hormone 07/03/2016  . 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 Tachycardia 05/10/2015  . Hyperlipidemia, mixed 03/28/2014    Allergies  Allergen Reactions  . Codeine Shortness Of Breath  . Hydrocodone-Acetaminophen Rash  . Aspirin Other (See Comments)    Stomach issues  . Diphenhydramine Hcl Other (See Comments)    Through an I.V. Drip in addition to other meds. Caused hallucinations  . Adhesive [Tape] Rash    ELECTRODES FROM HOLTER MONITOR CAUSED RASH    Past Surgical History:  Procedure Laterality Date  . ABDOMINAL HYSTERECTOMY Bilateral 07/02/2015   Procedure: HYSTERECTOMY ABDOMINAL  WITH BS;  Surgeon: Brayton Mars, MD;  Location: [facility];  Service: Gynecology;  Laterality: Bilateral;  . APPENDECTOMY    . CARDIAC ELECTROPHYSIOLOGY STUDY AND ABLATION    . CHOLECYSTECTOMY    . COLONOSCOPY  2013  . COLONOSCOPY WITH PROPOFOL N/A 03/10/2017   Procedure: COLONOSCOPY WITH PROPOFOL;  Surgeon: Lin Landsman, MD;  Location: [place];  Service: Endoscopy;  Laterality: N/A;  . CYSTOSCOPY Bilateral 07/02/2015   Procedure: CYSTOSCOPY;  Surgeon: Brayton Mars, MD;  Location: [facility];  Service: Gynecology;  Laterality: Bilateral;  . ESOPHAGEAL DILATION  03/10/2017   Procedure: ESOPHAGEAL DILATION;  Surgeon: Lin Landsman, MD;  Location: [place];  Service: Endoscopy;;  . ESOPHAGOGASTRODUODENOSCOPY N/A 03/10/2017   Procedure: ESOPHAGOGASTRODUODENOSCOPY (EGD);  Surgeon: Lin Landsman, MD;  Location: [place];  Service: Endoscopy;  Laterality: N/A;  . LAPAROTOMY N/A 07/11/2015   Procedure: hematoma evacuation;  Surgeon: Brayton Mars, MD;  Location: [facility];  Service: Gynecology;  Laterality: N/A;  . NISSEN FUNDOPLICATION    . TUBAL LIGATION      Social History   Tobacco Use  . Smoking status: Never Smoker  . Smokeless tobacco: Never Used  Substance Use Topics  . Alcohol use: No  . Drug use: No     Medication list has been reviewed and updated.  Current Meds  Medication Sig  . atorvastatin (LIPITOR) 10 MG tablet Take 1 tablet (10 mg total) by mouth daily at 6 PM.  . Biotin 1 MG CAPS Take by mouth.  . Multiple Vitamin (MULTIVITAMIN) tablet Take 1 tablet by mouth daily.  Marland Kitchen  propranolol (INDERAL) 20 MG tablet Take 1 tablet (20 mg total) by mouth daily as needed. (Patient taking differently: Take 20 mg by mouth 3 times/day as needed-between meals & bedtime. )  . propranolol (INDERAL) 80 MG tablet Take 80 mg by mouth 2 (two) times daily.    PHQ 2/9 Scores 07/30/2018 06/23/2018 10/06/2017 07/03/2016  PHQ - 2 Score 0  0 0 0    Physical Exam Vitals signs and nursing note reviewed.  Constitutional:      General: She is not in acute distress.    Appearance: She is well-developed.  HENT:     Head: Normocephalic and atraumatic.  Neck:     Musculoskeletal: Normal range of motion and neck supple.  Cardiovascular:     Rate and Rhythm: Normal rate and regular rhythm.  Pulmonary:     Effort: Pulmonary effort is normal. No respiratory distress.     Breath sounds: Normal breath sounds.  Musculoskeletal: Normal range of motion.  Lymphadenopathy:     Cervical: No cervical adenopathy.  Skin:    General: Skin is warm and dry.     Findings: No rash.     Comments: Darkened skin around neck, darker area posterior neck at the hair line Dark pigmented slightly raised soft lesions occupying about 1/4 of each axilla  Neurological:     Mental Status: She is alert and oriented to person, place, and time.  Psychiatric:        Behavior: Behavior normal.        Thought Content: Thought content normal.     BP 118/70   Pulse 78   Ht 5\' 6"  (1.676 m)   Wt 199 lb (90.3 kg)   LMP 06/26/2015 (Exact Date)   SpO2 97%   BMI 32.12 kg/m   Assessment and Plan: 1. Acanthosis nigricans Apply retin-A daily for 2 weeks If no improvement, will need dermatology referral - tretinoin (RETIN-A) 0.05 % cream; Apply topically at bedtime.  Dispense: 45 g; Refill: 0   Partially dictated using Editor, commissioning. Any errors are unintentional.  Halina Maidens, MD [facility]  07/30/2018

## 2019-02-09 ENCOUNTER — Ambulatory Visit: Payer: Managed Care, Other (non HMO) | Admitting: Internal Medicine

## 2019-02-22 ENCOUNTER — Ambulatory Visit: Payer: Managed Care, Other (non HMO) | Admitting: Internal Medicine

## 2019-02-28 ENCOUNTER — Other Ambulatory Visit: Payer: Self-pay

## 2019-02-28 ENCOUNTER — Ambulatory Visit (INDEPENDENT_AMBULATORY_CARE_PROVIDER_SITE_OTHER): Payer: Managed Care, Other (non HMO) | Admitting: Internal Medicine

## 2019-02-28 ENCOUNTER — Encounter: Payer: Self-pay | Admitting: Internal Medicine

## 2019-02-28 VITALS — BP 122/64 | HR 102 | Ht 66.0 in | Wt 191.0 lb

## 2019-02-28 DIAGNOSIS — J019 Acute sinusitis, unspecified: Secondary | ICD-10-CM | POA: Diagnosis not present

## 2019-02-28 DIAGNOSIS — R000 Tachycardia, unspecified: Secondary | ICD-10-CM

## 2019-02-28 DIAGNOSIS — H6983 Other specified disorders of Eustachian tube, bilateral: Secondary | ICD-10-CM | POA: Diagnosis not present

## 2019-02-28 MED ORDER — AMOXICILLIN-POT CLAVULANATE 875-125 MG PO TABS: 1.0000 | ORAL_TABLET | Freq: Two times a day (BID) | ORAL | 0 refills | Status: AC

## 2019-02-28 NOTE — Progress Notes (Signed)
Date:  02/28/2019   Name:  Brittany Baldwin   DOB:  03-Apr-1971   MRN:  [id number]   Chief Complaint: Dizziness (X 1- 2 months. Getting gradually worse. Feels pressure in head and around both eyes. She said she has dizziness on and off and tinnitis. She is having surgery on her Lft eye to pull back her eye lid because her muscle has gotten weak. Has ear popping on and off. ) and Tachycardia (Referral to Dr Heloise Ochoa in [facility] for Tachycardia. )  Dizziness This is a new problem. The current episode started more than 1 month ago. The problem occurs daily. The problem has been gradually worsening. Associated symptoms include congestion, headaches, a sore throat and vertigo. Pertinent negatives include no abdominal pain, chest pain, chills, coughing, fatigue, fever, nausea, numbness, vomiting or weakness. She has tried nothing for the symptoms.   Tachycardia -  S/p failed ablation in 2017, maintained on propranolol since then.  Episodes of tachycardia are becoming more frequent often lasting several days, more symptomatic and less responsive to additional PRN propranolol.   Review of Systems  Constitutional: Negative for chills, fatigue and fever.  HENT: Positive for congestion, ear pain, postnasal drip, sinus pressure, sore throat and trouble swallowing.   Respiratory: Negative for cough, chest tightness, shortness of breath and wheezing.   Cardiovascular: Positive for palpitations. Negative for chest pain and leg swelling.  Gastrointestinal: Negative for abdominal pain, nausea and vomiting.  Allergic/Immunologic: Negative for environmental allergies.  Neurological: Positive for dizziness, vertigo and headaches. Negative for tremors, weakness and numbness.  Psychiatric/Behavioral: Negative for sleep disturbance.    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 Drip in addition to other meds. Caused hallucinations   Adhesive [Tape] Rash    ELECTRODES FROM HOLTER MONITOR CAUSED RASH    Past Surgical History:  Procedure Laterality Date   ABDOMINAL HYSTERECTOMY Bilateral 07/02/2015   Procedure: HYSTERECTOMY ABDOMINAL WITH BS;  Surgeon: Brayton Mars, MD;  Location: [facility];  Service: Gynecology;  Laterality: Bilateral;   APPENDECTOMY     CARDIAC ELECTROPHYSIOLOGY STUDY AND ABLATION     CHOLECYSTECTOMY     COLONOSCOPY  2013   COLONOSCOPY WITH PROPOFOL N/A 03/10/2017   Procedure: COLONOSCOPY WITH PROPOFOL;  Surgeon: Lin Landsman, MD;  Location: [place];  Service: Endoscopy;  Laterality: N/A;   CYSTOSCOPY Bilateral 07/02/2015   Procedure: CYSTOSCOPY;  Surgeon: Brayton Mars, MD;  Location: [facility];  Service: Gynecology;  Laterality: Bilateral;   ESOPHAGEAL DILATION  03/10/2017   Procedure: ESOPHAGEAL DILATION;  Surgeon: Lin Landsman, MD;  Location: [place];  Service: Endoscopy;;   ESOPHAGOGASTRODUODENOSCOPY N/A 03/10/2017   Procedure: ESOPHAGOGASTRODUODENOSCOPY (EGD);  Surgeon: Lin Landsman, MD;  Location: [place];  Service: Endoscopy;  Laterality: N/A;   LAPAROTOMY N/A 07/11/2015  Procedure: hematoma evacuation;  Surgeon: Brayton Mars, MD;  Location: [facility];  Service: Gynecology;  Laterality: N/A;    NISSEN FUNDOPLICATION     TUBAL LIGATION      Social History   Tobacco Use   Smoking status: Never Smoker   Smokeless tobacco: Never Used  Substance Use Topics   Alcohol use: No   Drug use: No     Medication list has been reviewed and updated.  Current Meds  Medication Sig   atorvastatin (LIPITOR) 10 MG tablet Take 1 tablet (10 mg total) by mouth daily at 6 PM.   Biotin 1 MG CAPS Take by mouth.   Multiple Vitamin (MULTIVITAMIN) tablet Take 1 tablet by mouth daily.   propranolol (INDERAL) 20 MG tablet Take 1 tablet (20 mg total) by mouth daily as needed. (Patient taking differently: Take 20 mg by mouth 3 times/day as needed-between meals & bedtime. )   propranolol (INDERAL) 80 MG tablet Take 80 mg by mouth 2 (two) times daily.   tretinoin (RETIN-A) 0.05 % cream Apply topically at bedtime.    PHQ 2/9 Scores 02/28/2019 07/30/2018 06/23/2018 10/06/2017  PHQ - 2 Score 0 0 0 0    BP Readings from Last 3 Encounters:  02/28/19 122/64  07/30/18 118/70  06/23/18 128/78    Physical Exam Vitals signs and nursing note reviewed.  Constitutional:      General: She is not in acute distress.    Appearance: She is well-developed.  HENT:     Head: Normocephalic and atraumatic.     Right Ear: Ear canal and external ear normal. No middle ear effusion. Tympanic membrane is retracted. Tympanic membrane is not erythematous.     Left Ear: Ear canal and external ear normal.  No middle ear effusion. Tympanic membrane is retracted. Tympanic membrane is not erythematous.     Nose:     Right Sinus: Maxillary sinus tenderness and frontal sinus tenderness present.     Left Sinus: Maxillary sinus tenderness and frontal sinus tenderness present.     Mouth/Throat:     Mouth: No oral lesions.     Pharynx: Uvula midline. Posterior oropharyngeal erythema present. No oropharyngeal exudate.     Tonsils: No tonsillar exudate.  Cardiovascular:     Rate and Rhythm: Regular rhythm. Tachycardia  present.     Pulses:          Radial pulses are 2+ on the right side and 2+ on the left side.       Dorsalis pedis pulses are 2+ on the right side and 2+ on the left side.     Heart sounds: Normal heart sounds. No murmur.  Pulmonary:     Effort: Pulmonary effort is normal. No respiratory distress.     Breath sounds: Normal breath sounds. No wheezing or rales.  Musculoskeletal: Normal range of motion.     Right lower leg: No edema.     Left lower leg: No edema.  Lymphadenopathy:     Cervical: No cervical adenopathy.  Skin:    General: Skin is warm and dry.     Findings: No rash.  Neurological:     Mental Status: She is alert and oriented to person, place, and time.  Psychiatric:        Behavior: Behavior normal.        Thought Content: Thought content normal.     Wt Readings from Last 3 Encounters:  02/28/19 191 lb (86.6 kg)  07/30/18 199 lb (90.3 kg)  06/23/18 197 lb (89.4 kg)    BP 122/64    Pulse (!) 102    Ht 5\' 6"  (1.676 m)    Wt 191 lb (86.6 kg)    LMP 06/26/2015 (Exact Date)    SpO2 97%    BMI 30.83 kg/m   Assessment and Plan: 1. Tachycardia Continue propranolol but sx not controlled - Ambulatory referral to Cardiology  2. Eustachian tube dysfunction, bilateral Pt reluctant to try flonase - try claritin first and add flonase if needed  3. Acute non-recurrent sinusitis, unspecified location Recommend claritin and flonase to relieve pressure If no improvement, may need to refer to ENT for evaluation - amoxicillin-clavulanate (AUGMENTIN) 875-125 MG tablet; Take 1 tablet by mouth 2 (two) times daily for 10 days.  Dispense: 20 tablet; Refill: 0   Partially dictated using Editor, commissioning. Any errors are unintentional.  Halina Maidens, MD [facility]  02/28/2019

## 2019-02-28 NOTE — Patient Instructions (Signed)
Claritin 10 mg once a day +/- flonase nasal spray

## 2019-03-08 ENCOUNTER — Telehealth: Payer: Self-pay

## 2019-03-08 NOTE — Telephone Encounter (Signed)
Patient called saying she seen the ENT that we referred her to and he recommended her calling back PCP office to have menopause labs ordered.  Please advise.

## 2019-03-08 NOTE — Telephone Encounter (Signed)
Why would the ENT recommend menopause labs?  I do not do these.  If she is in menopause it would certainly be normal at her age.  I recommend that she see GYN if hormonal irregularity is a concern.

## 2019-03-09 NOTE — Telephone Encounter (Signed)
Patient said when she went to ENT she seen a PA and not a MD. The PA mentioned she could be having signs of menopause, or atypical migraines. Patient stated she was confused by this but will call a Gyn if desired to further evaluate menopause. The PA also ordered a balance and hearing test so she is waiting to be schedule for those at this time.   FYI. No response needed.

## 2019-04-04 ENCOUNTER — Other Ambulatory Visit: Payer: Self-pay | Admitting: Internal Medicine

## 2019-04-04 DIAGNOSIS — Z1231 Encounter for screening mammogram for malignant neoplasm of breast: Secondary | ICD-10-CM

## 2019-04-07 ENCOUNTER — Telehealth: Payer: Self-pay | Admitting: Internal Medicine

## 2019-04-07 NOTE — Telephone Encounter (Signed)
Patient is requesting a referral to [facility] endocrinologist Dr Noralee Stain, for menopause which she had mentioned to dr berglund before about having dizziness and headaches.

## 2019-04-08 NOTE — Telephone Encounter (Signed)
Patient will need to see a obgyn doctor for this. No endocrinology. She can call and make appt herself- no referral is needed. If she has any further questions, please send her to my VM.  Thank you.

## 2019-04-21 ENCOUNTER — Other Ambulatory Visit: Payer: Self-pay

## 2019-04-21 ENCOUNTER — Encounter: Payer: Self-pay | Admitting: Obstetrics and Gynecology

## 2019-04-21 ENCOUNTER — Ambulatory Visit: Payer: Managed Care, Other (non HMO) | Admitting: Obstetrics and Gynecology

## 2019-04-21 VITALS — BP 111/79 | HR 67 | Ht 66.0 in | Wt 196.5 lb

## 2019-04-21 DIAGNOSIS — R42 Dizziness and giddiness: Secondary | ICD-10-CM | POA: Diagnosis not present

## 2019-04-21 DIAGNOSIS — N951 Menopausal and female climacteric states: Secondary | ICD-10-CM

## 2019-04-21 NOTE — Progress Notes (Signed)
HPI:      [name] is a 48 y.o. [phone] who LMP was Patient's last menstrual period was 06/26/2015 (exact date).  Subjective:   She presents today with complaint of a several month history of lightheadedness.  She has been on medications for tachycardia and her tachycardia is under control but within the last 6 months she has had multiple episodes per week of lightheadedness where she feels pressure in the front of her head and has this type of episode.  Each episode seems to last between 5 minutes and 1 hour.  She has seen an ENT and gone through a work-up but this is proved negative.  Her cardiologist says it has nothing to do with medication that she is taking or her heart condition.  She has a future appointment with a neurologist.  One of her other doctors said it might be hormonal which is why she has come to me today.  She states that she has occasional hot flashes but does not believe they are related to this other phenomenon.    Hx: The following portions of the patient's history were reviewed and updated as appropriate:             She  has a past medical history of Anemia, Anxiety, Dysrhythmia, Fibroid, GERD (gastroesophageal reflux disease), Headache, Heart murmur, History of fundoplication (123XX123), History of pulmonary embolus during pregnancy (05/16/2015), Hyperlipemia, Motion sickness, PE (pulmonary thromboembolism) ([place]) (2000), PONV (postoperative nausea and vomiting), Shortness of breath dyspnea, Status post abdominal hysterectomy (07/02/2015), and Tachycardia. She does not have any pertinent problems on file. She  has a past surgical history that includes Tubal ligation; Appendectomy; Cholecystectomy; Nissen fundoplication; Cardiac electrophysiology study and ablation; Abdominal hysterectomy (Bilateral, 07/02/2015); Cystoscopy (Bilateral, 07/02/2015); laparotomy (N/A, 07/11/2015); Colonoscopy (2013); Colonoscopy with propofol (N/A, 03/10/2017); Esophagogastroduodenoscopy (N/A,  03/10/2017); and Esophageal dilation (03/10/2017). Her family history includes Congestive Heart Failure in her father; Diabetes in her mother; Heart failure in her father; Prostate cancer in her father. She  reports that she has never smoked. She has never used smokeless tobacco. She reports that she does not drink alcohol or use drugs. She has a current medication list which includes the following prescription(s): atorvastatin, biotin, nadolol, propranolol, tretinoin, and multivitamin. She is allergic to codeine; hydrocodone-acetaminophen; aspirin; diphenhydramine hcl; and adhesive [tape].       Review of Systems:  Review of Systems  Constitutional: Denied constitutional symptoms, night sweats, recent illness, fatigue, fever, insomnia and weight loss.  Eyes: Denied eye symptoms, eye pain, photophobia, vision change and visual disturbance.  Ears/Nose/Throat/Neck: Denied ear, nose, throat or neck symptoms, hearing loss, nasal discharge, sinus congestion and sore throat.  Cardiovascular: Denied cardiovascular symptoms, arrhythmia, chest pain/pressure, edema, exercise intolerance, orthopnea and palpitations.  Respiratory: Denied pulmonary symptoms, asthma, pleuritic pain, productive sputum, cough, dyspnea and wheezing.  Gastrointestinal: Denied, gastro-esophageal reflux, melena, nausea and vomiting.  Genitourinary: Denied genitourinary symptoms including symptomatic vaginal discharge, pelvic relaxation issues, and urinary complaints.  Musculoskeletal: Denied musculoskeletal symptoms, stiffness, swelling, muscle weakness and myalgia.  Dermatologic: Denied dermatology symptoms, rash and scar.  Neurologic: See HPI for additional information.  Psychiatric: Denied psychiatric symptoms, anxiety and depression.  Endocrine: Denied endocrine symptoms including hot flashes and night sweats.   Meds:   Current Outpatient Medications on File Prior to Visit  Medication Sig Dispense Refill  . atorvastatin  (LIPITOR) 10 MG tablet Take 1 tablet (10 mg total) by mouth daily at 6 PM. 90 tablet 3  . Biotin 1 MG  CAPS Take by mouth.    . nadolol (CORGARD) 40 MG tablet Take by mouth.    . propranolol (INDERAL) 20 MG tablet Take 1 tablet (20 mg total) by mouth daily as needed. 90 tablet 3  . tretinoin (RETIN-A) 0.05 % cream Apply topically at bedtime. 45 g 0  . Multiple Vitamin (MULTIVITAMIN) tablet Take 1 tablet by mouth daily.     No current facility-administered medications on file prior to visit.     Objective:     Vitals:   04/21/19 0847  BP: 111/79  Pulse: 67                Assessment:    [id number] Patient Active Problem List   Diagnosis Date Noted  . Acanthosis nigricans 07/30/2018  . Hoarseness, persistent 10/06/2017  . Esophageal dysphagia   . Functional dyspepsia 02/20/2017  . Hypercalcemia 01/18/2017  . Muscle spasm of right shoulder 08/05/2016  . Dependent edema 08/05/2016  . Plantar fasciitis, bilateral 08/05/2016  . 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 Elevated parathyroid hormone 07/03/2016  . 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 Tachycardia 05/10/2015  . Hyperlipidemia, mixed 03/28/2014     1. Lightheaded   2. Symptomatic menopausal or female climacteric states     Possibly strange type of menopausal symptoms/hot flash.  Advised patient to keep neurology consult.   Plan:            1.  FSH to rule out menopause.  We would then consider ERT and see if it made a difference in her other symptoms.  2.  Patient needs annual examination in the near future. Orders Orders Placed This Encounter  Procedures  . [place]    No orders of the defined types were placed in this encounter.     F/U  Return for We will contact her with any abnormal test results, Annual Physical. I spent 19 minutes involved in the care of this patient of which greater than 50% was spent discussing lightheadedness symptoms, work-up to date, possible differential diagnosis,  menopausal symptoms and relationship to dizziness.  All questions answered.  Finis Bud, M.D. 04/21/2019 9:14 AM

## 2019-04-22 LAB — FOLLICLE STIMULATING HORMONE: FSH: 10.9 m[IU]/mL

## 2019-05-03 LAB — HM MAMMOGRAPHY

## 2019-05-14 ENCOUNTER — Encounter: Payer: Self-pay | Admitting: Gynecology

## 2019-05-14 ENCOUNTER — Other Ambulatory Visit: Payer: Self-pay

## 2019-05-14 ENCOUNTER — Ambulatory Visit
Admission: EM | Admit: 2019-05-14 | Discharge: 2019-05-14 | Disposition: A | Discharge status: Home / Self Care | Payer: Managed Care, Other (non HMO) | Attending: Family Medicine | Admitting: Family Medicine

## 2019-05-14 ENCOUNTER — Encounter: Payer: Self-pay | Admitting: Internal Medicine

## 2019-05-14 DIAGNOSIS — R0602 Shortness of breath: Secondary | ICD-10-CM

## 2019-05-14 DIAGNOSIS — J029 Acute pharyngitis, unspecified: Secondary | ICD-10-CM

## 2019-05-14 DIAGNOSIS — R519 Headache, unspecified: Secondary | ICD-10-CM

## 2019-05-14 DIAGNOSIS — Z20822 Contact with and (suspected) exposure to covid-19: Secondary | ICD-10-CM

## 2019-05-14 DIAGNOSIS — Z20828 Contact with and (suspected) exposure to other viral communicable diseases: Secondary | ICD-10-CM

## 2019-05-14 DIAGNOSIS — B349 Viral infection, unspecified: Secondary | ICD-10-CM

## 2019-05-14 DIAGNOSIS — K219 Gastro-esophageal reflux disease without esophagitis: Secondary | ICD-10-CM

## 2019-05-14 LAB — RAPID STREP SCREEN (MED CTR MEBANE ONLY): Streptococcus, Group A Screen (Direct): NEGATIVE

## 2019-05-14 MED ORDER — PREDNISONE 20 MG PO TABS: 40.0000 mg | ORAL_TABLET | Freq: Every day | ORAL | 0 refills | Status: DC

## 2019-05-14 MED ORDER — ALUM & MAG HYDROXIDE-SIMETH 200-200-20 MG/5ML PO SUSP
30.0000 mL | Freq: Once | ORAL | Status: AC
  Administered 2019-05-14: 30 mL via ORAL

## 2019-05-14 MED ORDER — LIDOCAINE VISCOUS HCL 2 % MT SOLN
15.0000 mL | Freq: Once | OROMUCOSAL | Status: AC
  Administered 2019-05-14: 15 mL via ORAL

## 2019-05-14 MED ORDER — PHENOL 1.4 % MT LIQD: 1.0000 | OROMUCOSAL | 0 refills | Status: DC | PRN

## 2019-05-14 NOTE — Discharge Instructions (Addendum)
It was very nice seeing you today in clinic. Thank you for entrusting me with your care.   Rest and Stay HYDRATED. Water and electrolyte containing beverages (Gatorade, Pedialyte) are best to prevent dehydration and electrolyte abnormalities. Take medications as prescribed. Warm salt water gargles and lozenges as needed to help soothe throat. May use Tylenol and/or Ibuprofen as needed for pain/fever.   You were tested for SARS-CoV-2 (novel coronavirus) today. Testing is performed by an outside lab ([facility]) and has variable turn around times ranging between 2-5 days. Current recommendations from the the CDC and [place] DHHS require that you remain out of work in order to quarantine at home until negative test results are have been received. In the event that your test results are positive, you will be contacted with further directives. These measures are being implemented out of an abundance of caution to prevent transmission and spread during the current SARS-CoV-2 pandemic.  Make arrangements to follow up with your regular doctor in 1 week for re-evaluation if not improving. If your symptoms/condition worsens, please seek follow up care either here or in the ER. Please remember, our [facility] providers are "right here with you" when you need [place].   Again, it was my pleasure to take care of you today. Thank you for choosing our clinic. I hope that you start to feel better quickly.   Honor Loh, MSN, APRN, FNP-C, CEN Advanced Practice Provider [facility]

## 2019-05-14 NOTE — ED Triage Notes (Signed)
Patient c/o sore throat x 3 days ago

## 2019-05-15 LAB — NOVEL CORONAVIRUS, NAA (HOSP ORDER, SEND-OUT TO REF LAB; TAT 18-24 HRS): SARS-CoV-2, NAA: NOT DETECTED

## 2019-05-15 NOTE — ED Provider Notes (Signed)
[place]   Name: Brittany Baldwin DOB: 25-Jul-1970 MRN: [id number] CSN: [id number] PCP: Glean Hess, MD  Arrival date and time:  05/14/19 1509  Chief Complaint:  Sore Throat   NOTE: Prior to seeing the patient today, I have reviewed the triage nursing documentation and vital signs. Clinical staff has updated patient's PMH/PSHx, current medication list, and drug allergies/intolerances to ensure comprehensive history available to assist in medical decision making.   History:   HPI: Brittany Baldwin is a 48 y.o. female who presents today with complaints of shortness of breath, sore throat, generalized headaches, and retrosternal chest pain that began with acute onset on Wednesday (05/11/2019). Patient denies fevers. She denies cough. Pain in her chest pain is described as being dull in nature. Patient denies that she has experienced any nausea, vomiting, diarrhea, or abdominal pain. Pain does not radiate into her shoulder, neck, jaw, or sub-scapular area. She denies any episodes of diaphoresis. Patient has a PMH that is positive for GERD. Patient is s/p a Nissen's fundoplication 5 years ago, which improved her reflux symptoms until recently. Patient has been on a PPI in the past. She notes that she has recently restarted her omeprazole over the last few days, which she notes has not improved her symptoms. She is eating and drinking well. Patient denies any perceived alterations to her sense of taste or smell. Patient denies being in close contact with anyone known to be ill; no one else is her home has experienced a similar symptom constellation. She has not been tested for SARS-CoV-2 (novel coronavirus) in the past 14 days; last tested negative in September (pre-procedural) per her report. Patient has not been vaccinated for influenza this season. There are no provoking or palliating factors identified.   Past Medical History:  Diagnosis Date  . Anemia    H/O   . Anxiety   . Dysrhythmia    SVT-HAD CARDIAC ABLATION DONE IN 2016 BUT PT STATES SHE STILL GETS INTERMITTENT TACHYCARDIC AND IS SYPMPTOMATIC WITH SOB, DIZZINESS DURING TACHY EPISODES (06-25-15)  . Fibroid   . GERD (gastroesophageal reflux disease)   . Headache    H/O  . Heart murmur    as child  . History of fundoplication [id number]  . History of pulmonary embolus during pregnancy 05/16/2015   2001  . Hyperlipemia   . Motion sickness    cars, boats  . PE (pulmonary thromboembolism) ([place]) 2000   while on bed rest during pregnancy  . PONV (postoperative nausea and vomiting)   . Shortness of breath dyspnea    ASSOCIATED WITH TACHYCARDIA  . Status post abdominal hysterectomy 07/02/2015   Status post TAH, bilateral salpingectomy. PATHOLOGY: Uterine fibroids and adenomyosis   . Tachycardia     Past Surgical History:  Procedure Laterality Date  . ABDOMINAL HYSTERECTOMY Bilateral 07/02/2015   Procedure: HYSTERECTOMY ABDOMINAL WITH BS;  Surgeon: Brayton Mars, MD;  Location: [facility];  Service: Gynecology;  Laterality: Bilateral;  . APPENDECTOMY    . CARDIAC ELECTROPHYSIOLOGY STUDY AND ABLATION    . CHOLECYSTECTOMY    . COLONOSCOPY  2013  . COLONOSCOPY WITH PROPOFOL N/A 03/10/2017   Procedure: COLONOSCOPY WITH PROPOFOL;  Surgeon: Lin Landsman, MD;  Location: [facility];  Service: Endoscopy;  Laterality: N/A;  . CYSTOSCOPY Bilateral 07/02/2015   Procedure: CYSTOSCOPY;  Surgeon: Brayton Mars, MD;  Location: [facility];  Service: Gynecology;  Laterality: Bilateral;  . ESOPHAGEAL DILATION  03/10/2017   Procedure: ESOPHAGEAL DILATION;  Surgeon: Sherri Sear  Reece Levy, MD;  Location: [facility];  Service: Endoscopy;;  . ESOPHAGOGASTRODUODENOSCOPY N/A 03/10/2017   Procedure: ESOPHAGOGASTRODUODENOSCOPY (EGD);  Surgeon: Lin Landsman, MD;  Location: [place];  Service: Endoscopy;  Laterality: N/A;  . LAPAROTOMY N/A 07/11/2015   Procedure: hematoma evacuation;  Surgeon: Brayton Mars, MD;  Location: [facility];  Service: Gynecology;  Laterality: N/A;  . NISSEN FUNDOPLICATION    . TUBAL LIGATION      Family History  Problem Relation Age of Onset  . Diabetes Mother   . Prostate cancer Father   . Congestive Heart Failure Father   . Heart failure Father   . Cancer Neg Hx     Social History   Tobacco Use  . Smoking status: Never Smoker  . Smokeless tobacco: Never Used  Substance Use Topics  . Alcohol use: No  . Drug use: No    Patient Active Problem List   Diagnosis Date Noted  . Acanthosis nigricans 07/30/2018  . Hoarseness, persistent 10/06/2017  . Esophageal dysphagia   . Functional dyspepsia 02/20/2017  . Hypercalcemia 01/18/2017  . Muscle spasm of right shoulder 08/05/2016  . Dependent edema 08/05/2016  . Plantar fasciitis, bilateral 08/05/2016  . 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 Elevated parathyroid hormone 07/03/2016  . 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 Tachycardia 05/10/2015  . Hyperlipidemia, mixed 03/28/2014    Home Medications:    Current Meds  Medication Sig  . atorvastatin (LIPITOR) 10 MG tablet Take 1 tablet (10 mg total) by mouth daily at 6 PM.  . Biotin 1 MG CAPS Take by mouth.  . propranolol (INDERAL) 20 MG tablet Take 1 tablet (20 mg total) by mouth daily as needed.  . [DISCONTINUED] nadolol (CORGARD) 40 MG tablet Take by mouth.    Allergies:   Codeine, Hydrocodone-acetaminophen, Aspirin, Diphenhydramine hcl, and Adhesive [tape]  Review of Systems (ROS): Review of Systems  Constitutional: Negative for fatigue and fever.  HENT: Positive for sore throat. Negative for congestion, ear pain, postnasal drip, rhinorrhea, sinus pressure, sinus pain and sneezing.   Eyes: Negative for pain, discharge and redness.  Respiratory: Positive for shortness of breath. Negative for cough and chest tightness.   Cardiovascular: Positive for chest pain (dull). Negative for palpitations.  Gastrointestinal: Negative  for abdominal pain, diarrhea, nausea and vomiting.       PMH (+) GERD; s/p Nissen's fundoplication  Musculoskeletal: Negative for arthralgias, back pain, myalgias and neck pain.  Skin: Negative for color change, pallor and rash.  Neurological: Positive for headaches (chronic). Negative for dizziness, syncope and weakness.  Hematological: Negative for adenopathy.  Psychiatric/Behavioral: The patient is nervous/anxious.      Vital Signs: Today's Vitals   05/14/19 1537 05/14/19 1540 05/14/19 1629  BP:  (!) 107/92   Pulse:  73   Resp:  16   Temp:  97.9 F (36.6 C)   TempSrc:  Oral   SpO2:  100%   Weight: 193 lb (87.5 kg)    Height: 5\' 6"  (1.676 m)    PainSc: 4   4     Physical Exam: Physical Exam  Constitutional: She is oriented to person, place, and time and well-developed, well-nourished, and in no distress.  HENT:  Head: Normocephalic and atraumatic.  Right Ear: Tympanic membrane normal.  Left Ear: Tympanic membrane normal.  Nose: Nose normal.  Mouth/Throat: Uvula is midline and mucous membranes are normal. Posterior oropharyngeal erythema (+) clear PND present. No oropharyngeal exudate or posterior oropharyngeal edema.  Eyes: Pupils are equal, round, and reactive to light.  Cardiovascular: Normal rate, regular rhythm, normal heart sounds and intact distal pulses.  Dull CP (constant); not reproducible with movement or deep inspiration.   Pulmonary/Chest: Effort normal and breath sounds normal. She exhibits no tenderness and no edema.  Abdominal: Soft. Normal appearance, normal aorta and bowel sounds are normal. She exhibits no distension. There is no abdominal tenderness.  Musculoskeletal:     Cervical back: Normal range of motion and neck supple.  Neurological: She is alert and oriented to person, place, and time. She has normal sensation. Gait normal.  Skin: Skin is warm and dry. No rash noted. She is not diaphoretic.  Psychiatric: Memory, affect and judgment normal. Her  mood appears anxious.  Nursing note and vitals reviewed.   Urgent Care Treatments / Results:  LABS: PLEASE NOTE: all labs that were ordered this encounter are listed, however only abnormal results are displayed. Labs Reviewed  RAPID STREP SCREEN ([facility] ONLY)  NOVEL CORONAVIRUS, NAA (HOSP ORDER, SEND-OUT TO REF LAB; TAT 18-24 HRS)  CULTURE, GROUP A STREP [facility])    URGENT CARE ECG REPORT Date: 05/14/2019 Time ECG obtained: 1613 PM Rate: 63 bpm Rhythm: normal sinus rhythm Axis (leads I and aVF): Normal Intervals: Normal ST segment and T wave changes: No evidence of ST segment elevation or depression Comparison: Similar to previous tracing obtained on 04/12/2018.  RADIOLOGY: No results found.  PROCEDURES: Procedures  MEDICATIONS RECEIVED THIS VISIT: Medications  alum & mag hydroxide-simeth (MAALOX/MYLANTA) 200-200-20 MG/5ML suspension 30 mL (30 mLs Oral Given 05/14/19 1613)    And  lidocaine (XYLOCAINE) 2 % viscous mouth solution 15 mL (15 mLs Oral Given 05/14/19 1614)    PERTINENT CLINICAL COURSE NOTES/UPDATES:   Initial Impression / Assessment and Plan / Urgent Care Course:  Pertinent labs & imaging results that were available during my care of the patient were personally reviewed by me and considered in my medical decision making (see lab/imaging section of note for values and interpretations).  Brittany Baldwin is a 48 y.o. female who presents to [facility] today with complaints of Sore Throat   Patient overall well appearing and in no acute distress today in clinic. Presenting symptoms (see HPI) and exam as documented above. Patient with multiple medical complaints today. She presents with symptoms associated with SARS-CoV-2 (novel coronavirus). Discussed typical symptom constellation. Reviewed potential for infection and need for testing. Patient amenable to being tested. SARS-CoV-2 swab collected by certified clinical staff. Discussed variable turn around  times associated with testing, as swabs are being processed at [facility], and have been taking between 2-5 days to come back. She was advised to self quarantine, per [facility] DHHS guidelines, until negative results received.    Presenting symptoms consistent with acute viral illness. Until ruled out with confirmatory lab testing, SARS-CoV-2 remains part of the differential. Her testing is pending at this time. I discussed with her that her symptoms are felt to be viral in nature, thus antibiotics would not offer her any relief or improve his symptoms any faster than conservative symptomatic management.    Throat is sore and causing significant discomfort. Will send in a 4 day steroid burst to help reduce inflammation. Additionally, will send in a supply of 1.4% phenol spray for PRN use. Recommended warm salt water gargles, hard candies/lozenges, and hot tea with honey/lemon to help soothe the throat and reduce irritation.    Discussed supportive care measures at home during acute phase of illness.  Patient to rest as much as possible. She was encouraged to ensure adequate hydration (water and ORS) to prevent dehydration and electrolyte derangements. Patient may use APAP and/or IBU on an as needed basis for pain/fever.     EKG shows NSR without ectopy at a rate of 63 bpm; no evidence of ST segment elevation or depression. GI cocktail given in clinic, which improved her chest pain/reflux symptoms. Patient encouraged to take PPI (omeprazole) on a daily basis to assess for improvement. May take a few days to become effective for her again given that she has been off of the medication. If no improvement, discussed following up with PCP to discuss medication change and/or referral to GI for consideration of EGD +/- UGI to assess her post-operative esophagus.   Discussed follow up with primary care physician in 1 week for re-evaluation. I have reviewed the follow up and strict return precautions for any new or  worsening symptoms. Patient is aware of symptoms that would be deemed urgent/emergent, and would thus require further evaluation either here or in the emergency department. At the time of discharge, she verbalized understanding and consent with the discharge plan as it was reviewed with her. All questions were fielded by provider and/or clinic staff prior to patient discharge.    Final Clinical Impressions / Urgent Care Diagnoses:   Final diagnoses:  Viral illness  Gastroesophageal reflux disease, unspecified whether esophagitis present  Encounter for laboratory testing for COVID-19 virus    New Prescriptions:  [place] Controlled Substance Registry consulted? Not Applicable  Meds ordered this encounter  Medications  . AND Linked Order Group   . alum & mag hydroxide-simeth (MAALOX/MYLANTA) 200-200-20 MG/5ML suspension 30 mL   . lidocaine (XYLOCAINE) 2 % viscous mouth solution 15 mL  . predniSONE (DELTASONE) 20 MG tablet    Sig: Take 2 tablets (40 mg total) by mouth daily.    Dispense:  8 tablet    Refill:  0  . phenol (CHLORASEPTIC) 1.4 % LIQD    Sig: Use as directed 1 spray in the mouth or throat as needed for throat irritation / pain.    Dispense:  177 mL    Refill:  0    Recommended Follow up Care:  Patient encouraged to follow up with the following provider within the specified time frame, or sooner as dictated by the severity of her symptoms. As always, she was instructed that for any urgent/emergent care needs, she should seek care either here or in the emergency department for more immediate evaluation.  Follow-up Information    Glean Hess, MD In 1 week.   Specialty: Internal Medicine Why: General reassessment of symptoms if not improving Contact information: [address] [place] [address] [phone]         NOTE: This note was prepared using Dragon dictation software along with smaller phrase technology. Despite my best ability to proofread,  there is the potential that transcriptional errors may still occur from this process, and are completely unintentional.     Karen Kitchens, NP 05/15/19 2325

## 2019-05-16 ENCOUNTER — Other Ambulatory Visit: Payer: Self-pay

## 2019-05-16 LAB — CULTURE, GROUP A STREP (THRC)

## 2019-05-16 MED ORDER — PANTOPRAZOLE SODIUM 40 MG PO TBEC: 40.0000 mg | DELAYED_RELEASE_TABLET | Freq: Two times a day (BID) | ORAL | 1 refills | Status: DC

## 2019-05-17 ENCOUNTER — Other Ambulatory Visit: Payer: Self-pay

## 2019-05-17 LAB — CULTURE, GROUP A STREP (THRC)

## 2019-05-17 MED ORDER — PANTOPRAZOLE SODIUM 40 MG PO TBEC: 40.0000 mg | DELAYED_RELEASE_TABLET | Freq: Every day | ORAL | 1 refills | Status: DC

## 2019-05-19 ENCOUNTER — Telehealth: Payer: Self-pay | Admitting: Internal Medicine

## 2019-05-19 NOTE — Telephone Encounter (Signed)
What is the referral for? 

## 2019-05-19 NOTE — Telephone Encounter (Signed)
Tried to call patient back but could not reach her on the phone.

## 2019-05-19 NOTE — Telephone Encounter (Signed)
Tried calling pt. Unable to reach her. Will try again.

## 2019-05-19 NOTE — Telephone Encounter (Signed)
Referral sent over to dr Barb Merino [facility]

## 2019-05-20 NOTE — Telephone Encounter (Signed)
Patient on schedule next week to discuss GERD and referral for GI.

## 2019-05-25 ENCOUNTER — Ambulatory Visit: Payer: Managed Care, Other (non HMO) | Admitting: Internal Medicine

## 2019-06-06 ENCOUNTER — Other Ambulatory Visit: Payer: Self-pay

## 2019-06-06 ENCOUNTER — Encounter: Payer: Self-pay | Admitting: Gastroenterology

## 2019-06-06 ENCOUNTER — Ambulatory Visit (INDEPENDENT_AMBULATORY_CARE_PROVIDER_SITE_OTHER): Payer: Managed Care, Other (non HMO) | Admitting: Gastroenterology

## 2019-06-06 VITALS — BP 122/80 | HR 73 | Temp 98.3°F | Resp 17 | Ht 66.0 in | Wt 191.8 lb

## 2019-06-06 DIAGNOSIS — R1013 Epigastric pain: Secondary | ICD-10-CM

## 2019-06-06 NOTE — Progress Notes (Signed)
Cephas Darby, MD [address]  [place]  [place], [place] [address]  Main: [phone]  Fax: [phone]    Gastroenterology Consultation  Referring Provider:     Glean Hess, MD Primary Care Physician:  Glean Hess, MD Primary Gastroenterologist:  None Reason for Consultation:  Epigastric pain, difficulty swallowing, chronic constipation        HPI:   Brittany Baldwin is a 49 y.o. y/oAfrican-American female referred by Dr. Army Melia, Jesse Sans, MD  for consultation & management of chronic dysphagia, chronic constipation, epigastric pain  Chronic dysphagia: She had a Nissen's fundoplication done approximately in year 2012 or 2013 in [place]. She reports that since the surgery she has been having difficulty swallowing solids, sensation of food stuck in the throat. She had a esophageal manometry performed and it was normal. She had a pH study which was also negative for acid reflux. She is gaining weight. She is currently not on PPI. She did not have empiric dilation for symptomatic dysphagia.   Dyspepsia: This is started recently. Has been experiencing epigastric pain, sharp worse after eating especially high-calorie foods, early satiety, associated with nausea and bloating. She had an EGD with gastric biopsies performed in 06/2014. Negative for H. pylori. She has not tried any medications for dyspepsia. Status post cholecystectomy. No recent abdominal imaging. As not have anemia. LFTs are normal  Chronic constipation: For several years. She tried linaclotide 90 g but not effective. Take senna 4 tablets which helps to have bowel movement one to 2 daily. She could not tolerate MiraLAX due to the taste. She ran out of senna. She had a small hard stool today, prior to this, it was 2 or 3 days ago. He also reports diffuse abdominal pain. Her last colonoscopy was about 10 years ago. She denies any rectal bleeding, hematochezia.  Follow-up visit 06/06/2019 Patient reports having  similar symptoms for which I saw her 2 years ago.  She underwent EGD with empiric dilation of the GE junction.  She was doing fairly well up until about a month ago.  She now reports epigastric pain, burning type associated with radiation to her left shoulder and left lower rib cage.  She also reports dull pain in her chest.  She is status post cholecystectomy.  She denies any particular food triggers or stressors in her life.  Currently not working.  She does have ongoing difficulty swallowing certain solid foods.  She does have constipation, managed with Senokot 2 times a day.  She does not smoke or drink alcohol.  She has appointment with her cardiologist Tuesday this week.  She denies shortness of breath, lightheadedness, sweating.  She does report her upper GI symptoms predominantly in the morning after she wakes up which is consistent.  She tried omeprazole once a day which did not help.  Currently on pantoprazole 40 mg at bedtime.  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 - A Nissen fundoplication was found. The wrap appears intact. Dilated to 18 mm. - Normal gastroesophageal junction and esophagus. - No specimens collected.  - Non-bleeding internal hemorrhoids. - The entire examined colon is normal. - No specimens collected.  EGD on 06/05/2014 A. GASTRIC ANTRUM (ENDOSCOPIC BIOPSY):  GASTRIC ANTRAL MUCOSA WITH REACTIVE FOVEOLAR HYPERPLASIA AND MILD CHRONIC  GASTRITIS. NO ACTIVE GASTRITIS IS SEEN. IMMUNOHISTOCHEMISTRY FOR HELICOBACTER PYLORI IS NEGATIVE. B. ESOPHAGUS AT GASTROESOPHAGEAL JUNCTION (ENDOSCOPIC BIOPSY):  SQUAMOUS MUCOSA WITH NO PATHOLOGIC ABNORMALITY. NO EVIDENCE OF REFLUX OR EOSINOPHILIC ESOPHAGITIS IS SEEN. NEGATIVE FOR INTESTINAL METAPLASIA, DYSPLASIA OR  MALIGNANCY. ----------------------------------------------------------------------------------------------------------- pH Bravo Testing Results01/12/2014 at [facility]  Results of 48 hour pH monitoring by way of an endoscopically  placed pH Bravo probe revealed: - NO Significant presence of acid as evidenced by pH lower than 4  throughout the monitoring period (DeMeester score of 1.4 & 2.1 on  days 1 & 2 respectively) - Given the above and recent workup demonstrating intact Nissen  Fundoplication, No additional reflux testing/interventions  Recommended. ------------------------------------------------------------------------------------------------ NM gastric emptying study 12/22/2014  Findings : The stomach is located normally in the left upper abdomen.  Solid gastric emptying at 60 minutes is 48% (normal range at 60 minutes is >10%). Solid gastric emptying at 120 minutes is 69% (normal range at 120 minutes is >40%). Solid gastric emptying at 244 minutes is 97% (normal range at 240 minutes is >90%).   Past Medical History:  Diagnosis Date  . Anemia    H/O   . Anxiety   . Dysrhythmia    SVT-HAD CARDIAC ABLATION DONE IN 2016 BUT PT STATES SHE STILL GETS INTERMITTENT TACHYCARDIC AND IS SYPMPTOMATIC WITH SOB, DIZZINESS DURING TACHY EPISODES (06-25-15)  . Fibroid   . GERD (gastroesophageal reflux disease)   . Headache    H/O  . Heart murmur    as child  . History of fundoplication 123XX123  . History of pulmonary embolus during pregnancy 05/16/2015   2001  . Hyperlipemia   . Motion sickness    cars, boats  . PE (pulmonary thromboembolism) ([place]) 2000   while on bed rest during pregnancy  . PONV (postoperative nausea and vomiting)   . Shortness of breath dyspnea    ASSOCIATED WITH TACHYCARDIA  . Status post abdominal hysterectomy 07/02/2015   Status post TAH, bilateral salpingectomy. PATHOLOGY: Uterine fibroids and adenomyosis   . Tachycardia     Past Surgical History:    Procedure Laterality Date  . ABDOMINAL HYSTERECTOMY Bilateral 07/02/2015   Procedure: HYSTERECTOMY ABDOMINAL WITH BS;  Surgeon: Brayton Mars, MD;  Location: [facility];  Service: Gynecology;  Laterality: Bilateral;  . APPENDECTOMY    . CARDIAC ELECTROPHYSIOLOGY STUDY AND ABLATION    . CHOLECYSTECTOMY    . COLONOSCOPY  2013  . COLONOSCOPY WITH PROPOFOL N/A 03/10/2017   Procedure: COLONOSCOPY WITH PROPOFOL;  Surgeon: Lin Landsman, MD;  Location: [place];  Service: Endoscopy;  Laterality: N/A;  . CYSTOSCOPY Bilateral 07/02/2015   Procedure: CYSTOSCOPY;  Surgeon: Brayton Mars, MD;  Location: [facility];  Service: Gynecology;  Laterality: Bilateral;  . ESOPHAGEAL DILATION  03/10/2017   Procedure: ESOPHAGEAL DILATION;  Surgeon: Lin Landsman, MD;  Location: [facility];  Service: Endoscopy;;  . ESOPHAGOGASTRODUODENOSCOPY N/A 03/10/2017   Procedure: ESOPHAGOGASTRODUODENOSCOPY (EGD);  Surgeon: Lin Landsman, MD;  Location: [place];  Service: Endoscopy;  Laterality: N/A;  . LAPAROTOMY N/A 07/11/2015   Procedure: hematoma evacuation;  Surgeon: Brayton Mars, MD;  Location: [facility];  Service: Gynecology;  Laterality: N/A;  . NISSEN FUNDOPLICATION    . TUBAL LIGATION     Current Outpatient Medications:  .  atorvastatin (LIPITOR) 10 MG tablet, Take 1 tablet (10 mg total) by mouth daily at 6 PM., Disp: 90 tablet, Rfl: 3 .  azelastine (ASTELIN) 0.1 % nasal spray, Place 1 spray  into both nostrils 2 (two) times daily., Disp: , Rfl:  .  Biotin 1 MG CAPS, Take by mouth., Disp: , Rfl:  .  pantoprazole (PROTONIX) 40 MG tablet, Take 1 tablet (40 mg total) by mouth daily., Disp: 90 tablet, Rfl: 1 .  propranolol (INDERAL) 20 MG tablet, Take 1 tablet (20 mg total) by mouth daily as needed., Disp: 90 tablet, Rfl: 3 .  senna (SENOKOT) 8.6 MG tablet, Take 1 tablet by mouth 2 (two) times daily., Disp: , Rfl:     Family History  Problem Relation Age  of Onset  . Diabetes Mother   . Prostate cancer Father   . Congestive Heart Failure Father   . Heart failure Father   . Cancer Neg Hx      Social History   Tobacco Use  . Smoking status: Never Smoker  . Smokeless tobacco: Never Used  Substance Use Topics  . Alcohol use: No  . Drug use: No    Allergies as of 06/06/2019 - Review Complete 06/06/2019  Allergen Reaction Noted  . Codeine Shortness Of Breath 05/09/2015  . Hydrocodone-acetaminophen Rash 05/09/2015  . Aspirin Other (See Comments) 05/09/2015  . Diphenhydramine hcl Other (See Comments) 05/09/2015  . Adhesive [tape] Rash 06/25/2015    Review of Systems:    All systems reviewed and negative except where noted in HPI.   Physical Exam:  BP 122/80 (BP Location: Left Arm, Patient Position: Sitting, Cuff Size: Large)   Pulse 73   Temp 98.3 F (36.8 C)   Resp 17   Ht 5\' 6"  (1.676 m)   Wt 191 lb 12.8 oz (87 kg)   LMP 06/26/2015 (Exact Date)   BMI 30.96 kg/m  Patient's last menstrual period was 06/26/2015 (exact date).  General:   Alert,  Well-developed, well-nourished, pleasant and cooperative in NAD Head:  Normocephalic and atraumatic. Eyes:  Sclera clear, no icterus.   Conjunctiva pink. Ears:  Normal auditory acuity. Nose:  No deformity, discharge, or lesions. Mouth:  No deformity or lesions,oropharynx pink & moist. Neck:  Supple; no masses or thyromegaly. Abdomen:  Normal bowel sounds.  No bruits.  Soft, nontender and non-distended without masses, hepatosplenomegaly or hernias noted.  No guarding or rebound tenderness.   Rectal: Nor performed Msk:  Symmetrical without gross deformities. Good, equal movement & strength bilaterally. Pulses:  Normal pulses noted. Extremities:  No clubbing or edema.  No cyanosis. Neurologic:  Alert and oriented x3;  grossly normal neurologically. Skin:  Intact without significant lesions or rashes. No jaundice. Psych:  Alert and cooperative. Normal mood and affect.  Imaging  Studies: Reviewed  Assessment and Plan:   Brittany Baldwin is a 49 y.o. African American female with history of refractory GERD status post Nissen's fundoplication presents with recurrence of 1 month history of epigastric pain, chronic constipation, chronic dysphagia  Chronic dysphagia: started since surgery. Had extensive workup done. Manometry normal. PH study normal. EGD revealed normal wrap in the past. 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  She underwent repeat EGD with empiric dilation to 8mm.  Consider to repeat EGD with esophageal biopsies if her symptoms are persistent  Functional dyspepsia: H. pylori negative in the past, gastric emptying study normal.  Suggested to try Protonix 40 mg twice daily before meals.  Check H. pylori IgG and treat if positive.  I also recommended trial of tricyclic antidepressant but patient was hesitant to try. Recommended desipramine in the past.  Chronic constipation: Currently regulated on Senokot 2 times a day.  She is  willing to try MiraLAX   Colon cancer screening: Repeat in 03/2027    Follow up in 1-2 months   Cephas Darby, MD

## 2019-06-07 LAB — H. PYLORI ANTIBODY, IGG: H. pylori, IgG AbS: 0.45 Index Value (ref 0.00–0.79)

## 2019-06-21 ENCOUNTER — Other Ambulatory Visit: Payer: Self-pay | Admitting: Internal Medicine

## 2019-06-21 DIAGNOSIS — K30 Functional dyspepsia: Secondary | ICD-10-CM

## 2019-06-27 ENCOUNTER — Other Ambulatory Visit: Payer: Self-pay

## 2019-06-27 ENCOUNTER — Ambulatory Visit: Payer: Managed Care, Other (non HMO) | Admitting: Gastroenterology

## 2019-06-27 ENCOUNTER — Encounter: Payer: Self-pay | Admitting: Gastroenterology

## 2019-06-27 ENCOUNTER — Ambulatory Visit: Payer: 59

## 2019-06-27 VITALS — BP 105/66 | HR 82 | Temp 98.4°F | Wt 198.5 lb

## 2019-06-27 DIAGNOSIS — R0789 Other chest pain: Secondary | ICD-10-CM | POA: Diagnosis not present

## 2019-06-27 DIAGNOSIS — K5904 Chronic idiopathic constipation: Secondary | ICD-10-CM | POA: Diagnosis not present

## 2019-06-27 DIAGNOSIS — R1013 Epigastric pain: Secondary | ICD-10-CM | POA: Diagnosis not present

## 2019-06-27 NOTE — Patient Instructions (Signed)

## 2019-06-27 NOTE — Progress Notes (Signed)
Cephas Darby, MD [address]  [place]  [place], [place] [address]  Main: [phone]  Fax: [phone]    Gastroenterology Consultation  Referring Provider:     Glean Hess, MD Primary Care Physician:  Glean Hess, MD Primary Gastroenterologist:  None Reason for Consultation:  Epigastric pain, difficulty swallowing, chronic constipation        HPI:   Brittany Baldwin is a 49 y.o. y/oAfrican-American female referred by Dr. Army Melia, Jesse Sans, MD  for consultation & management of chronic dysphagia, chronic constipation, epigastric pain  Chronic dysphagia: She had a Nissen's fundoplication done approximately in year 2012 or 2013 in [place]. She reports that since the surgery she has been having difficulty swallowing solids, sensation of food stuck in the throat. She had a esophageal manometry performed and it was normal. She had a pH study which was also negative for acid reflux. She is gaining weight. She is currently not on PPI. She did not have empiric dilation for symptomatic dysphagia.   Dyspepsia: This is started recently. Has been experiencing epigastric pain, sharp worse after eating especially high-calorie foods, early satiety, associated with nausea and bloating. She had an EGD with gastric biopsies performed in 06/2014. Negative for H. pylori. She has not tried any medications for dyspepsia. Status post cholecystectomy. No recent abdominal imaging. As not have anemia. LFTs are normal  Chronic constipation: For several years. She tried linaclotide 90 g but not effective. Take senna 4 tablets which helps to have bowel movement one to 2 daily. She could not tolerate MiraLAX due to the taste. She ran out of senna. She had a small hard stool today, prior to this, it was 2 or 3 days ago. He also reports diffuse abdominal pain. Her last colonoscopy was about 10 years ago. She denies any rectal bleeding, hematochezia.  Follow-up visit 06/06/2019 Patient reports having  similar symptoms for which I saw her 2 years ago.  She underwent EGD with empiric dilation of the GE junction.  She was doing fairly well up until about a month ago.  She now reports epigastric pain, burning type associated with radiation to her left shoulder and left lower rib cage.  She also reports dull pain in her chest.  She is status post cholecystectomy.  She denies any particular food triggers or stressors in her life.  Currently not working.  She does have ongoing difficulty swallowing certain solid foods.  She does have constipation, managed with Senokot 2 times a day.  She does not smoke or drink alcohol.  She has appointment with her cardiologist Tuesday this week.  She denies shortness of breath, lightheadedness, sweating.  She does report her upper GI symptoms predominantly in the morning after she wakes up which is consistent.  She tried omeprazole once a day which did not help.  Currently on pantoprazole 40 mg at bedtime.  Follow-up visit 06/27/2019 Patient underwent work-up by cardiology for atypical chest pain which was unremarkable.  She continues to have aching pain in her chest which is worrying her.  After increasing Protonix to 40 mg 2 times a day, her swallowing difficulty has significantly improved.  She also complains of epigastric burning/aching pain associated with gas.  She wants to know if she has developed recurrence of hernia and the fundoplication has failed.  H. pylori IgG was negative.  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 - A Nissen fundoplication was found. The wrap appears intact. Dilated to 18 mm. - Normal gastroesophageal junction and esophagus. - No specimens collected.  -  Non-bleeding internal hemorrhoids. - The entire examined colon is normal. - No specimens collected.  EGD on 06/05/2014 A. GASTRIC ANTRUM (ENDOSCOPIC BIOPSY):  GASTRIC ANTRAL MUCOSA WITH REACTIVE FOVEOLAR HYPERPLASIA AND MILD CHRONIC GASTRITIS. NO ACTIVE GASTRITIS IS SEEN. IMMUNOHISTOCHEMISTRY FOR HELICOBACTER PYLORI IS NEGATIVE. B. ESOPHAGUS AT GASTROESOPHAGEAL JUNCTION (ENDOSCOPIC BIOPSY):  SQUAMOUS MUCOSA WITH NO PATHOLOGIC ABNORMALITY. NO EVIDENCE OF REFLUX OR EOSINOPHILIC ESOPHAGITIS IS SEEN. NEGATIVE FOR INTESTINAL METAPLASIA, DYSPLASIA OR MALIGNANCY. ----------------------------------------------------------------------------------------------------------- pH Bravo Testing Results01/12/2014 at [facility]  Results of 48 hour pH monitoring by way of an endoscopically  placed pH Bravo probe revealed: - NO Significant presence of acid as evidenced by pH lower than 4  throughout the monitoring period (DeMeester score of 1.4 & 2.1 on  days 1 & 2 respectively) - Given the above and recent workup demonstrating intact Nissen  Fundoplication, No additional reflux testing/interventions  Recommended. ------------------------------------------------------------------------------------------------ NM gastric emptying study 12/22/2014  Findings : The stomach is located normally in the left upper abdomen.  Solid gastric emptying at 60 minutes is 48% (normal range at 60 minutes is >10%). Solid gastric emptying at 120 minutes is 69% (normal range at 120 minutes is >40%). Solid gastric emptying at 244 minutes is 97% (normal range at 240 minutes is >90%).   Past Medical History:  Diagnosis Date  . Anemia    H/O   . Anxiety   . Dysrhythmia    SVT-HAD CARDIAC ABLATION DONE IN 2016 BUT PT STATES SHE STILL GETS INTERMITTENT TACHYCARDIC AND IS SYPMPTOMATIC WITH SOB, DIZZINESS DURING TACHY EPISODES (06-25-15)  . Fibroid   . GERD (gastroesophageal reflux disease)   . Headache    H/O  . Heart  murmur    as child  . History of fundoplication 123XX123  . History of pulmonary embolus during pregnancy 05/16/2015   2001  . Hoarseness, persistent 10/06/2017  . Hyperlipemia   . Motion sickness    cars, boats  . PE (pulmonary thromboembolism) ([place]) 2000   while on bed rest during pregnancy  . PONV (postoperative nausea and vomiting)   . Shortness of breath dyspnea    ASSOCIATED WITH TACHYCARDIA  . Status post abdominal hysterectomy 07/02/2015   Status post TAH, bilateral salpingectomy. PATHOLOGY: Uterine fibroids and adenomyosis   . Tachycardia     Past Surgical History:  Procedure Laterality Date  . ABDOMINAL HYSTERECTOMY Bilateral 07/02/2015   Procedure: HYSTERECTOMY ABDOMINAL WITH BS;  Surgeon: Brayton Mars, MD;  Location: [facility];  Service: Gynecology;  Laterality: Bilateral;  . APPENDECTOMY    . CARDIAC ELECTROPHYSIOLOGY STUDY AND ABLATION    . CHOLECYSTECTOMY    . COLONOSCOPY  2013  . COLONOSCOPY WITH PROPOFOL N/A 03/10/2017   Procedure: COLONOSCOPY WITH PROPOFOL;  Surgeon: Lin Landsman, MD;  Location: [facility];  Service: Endoscopy;  Laterality: N/A;  . CYSTOSCOPY Bilateral 07/02/2015   Procedure: CYSTOSCOPY;  Surgeon: Brayton Mars, MD;  Location: [facility];  Service: Gynecology;  Laterality: Bilateral;  . ESOPHAGEAL DILATION  03/10/2017   Procedure: ESOPHAGEAL DILATION;  Surgeon: Lin Landsman, MD;  Location: [place];  Service: Endoscopy;;  . ESOPHAGOGASTRODUODENOSCOPY N/A 03/10/2017   Procedure: ESOPHAGOGASTRODUODENOSCOPY (  EGD);  Surgeon: Lin Landsman, MD;  Location: [place];  Service: Endoscopy;  Laterality: N/A;  . LAPAROTOMY N/A 07/11/2015   Procedure: hematoma evacuation;  Surgeon: Brayton Mars, MD;  Location: [facility];  Service: Gynecology;  Laterality: N/A;  . NISSEN FUNDOPLICATION    . TUBAL LIGATION     Current Outpatient Medications:  .  atorvastatin (LIPITOR) 10 MG tablet, Take 1 tablet  (10 mg total) by mouth daily at 6 PM., Disp: 90 tablet, Rfl: 3 .  azelastine (ASTELIN) 0.1 % nasal spray, Place 1 spray into both nostrils 2 (two) times daily., Disp: , Rfl:  .  Biotin 1 MG CAPS, Take by mouth., Disp: , Rfl:  .  omeprazole (PRILOSEC) 40 MG capsule, TAKE ONE CAPSULE BY MOUTH EVERY DAY, Disp: 30 capsule, Rfl: 5 .  pantoprazole (PROTONIX) 40 MG tablet, Take 1 tablet (40 mg total) by mouth daily., Disp: 90 tablet, Rfl: 1 .  propranolol (INDERAL) 20 MG tablet, Take 1 tablet (20 mg total) by mouth daily as needed., Disp: 90 tablet, Rfl: 3 .  senna (SENOKOT) 8.6 MG tablet, Take 1 tablet by mouth 2 (two) times daily., Disp: , Rfl:     Family History  Problem Relation Age of Onset  . Diabetes Mother   . Prostate cancer Father   . Congestive Heart Failure Father   . Heart failure Father   . Cancer Neg Hx      Social History   Tobacco Use  . Smoking status: Never Smoker  . Smokeless tobacco: Never Used  Substance Use Topics  . Alcohol use: No  . Drug use: No    Allergies as of 06/27/2019 - Review Complete 06/27/2019  Allergen Reaction Noted  . Codeine Shortness Of Breath 05/09/2015  . Hydrocodone-acetaminophen Rash 05/09/2015  . Aspirin Other (See Comments) 05/09/2015  . Diphenhydramine hcl Other (See Comments) 05/09/2015  . Adhesive [tape] Rash 06/25/2015    Review of Systems:    All systems reviewed and negative except where noted in HPI.   Physical Exam:  BP 105/66 (BP Location: Left Arm, Patient Position: Sitting, Cuff Size: Normal)   Pulse 82   Temp 98.4 F (36.9 C) (Oral)   Wt 198 lb 8 oz (90 kg)   LMP 06/26/2015 (Exact Date)   BMI 32.04 kg/m  Patient's last menstrual period was 06/26/2015 (exact date).  General:   Alert,  Well-developed, well-nourished, pleasant and cooperative in NAD Head:  Normocephalic and atraumatic. Eyes:  Sclera clear, no icterus.   Conjunctiva pink. Ears:  Normal auditory acuity. Nose:  No deformity, discharge, or  lesions. Mouth:  No deformity or lesions,oropharynx pink & moist. Neck:  Supple; no masses or thyromegaly. Abdomen:  Normal bowel sounds.  No bruits.  Soft, nontender and non-distended without masses, hepatosplenomegaly or hernias noted.  No guarding or rebound tenderness.   Rectal: Nor performed Msk:  Symmetrical without gross deformities. Good, equal movement & strength bilaterally. Pulses:  Normal pulses noted. Extremities:  No clubbing or edema.  No cyanosis. Neurologic:  Alert and oriented x3;  grossly normal neurologically. Skin:  Intact without significant lesions or rashes. No jaundice. Psych:  Alert and cooperative. Normal mood and affect.  Imaging Studies: Reviewed  Assessment and Plan:   Brittany Baldwin is a 49 y.o. African American female with history of refractory GERD status post Nissen's fundoplication presents for follow-up of  recurrence of epigastric pain, chronic constipation, chronic dysphagia, atypical chest pain  Atypical chest pain Recommend EGD with  esophageal biopsies and assess the fundoplication, empiric dilation if needed  Chronic dysphagia: started since surgery. Had extensive workup done. Manometry normal. PH study normal. EGD revealed normal wrap in the past. 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  She underwent repeat EGD with empiric dilation to 78mm.  This is significantly improved on PPI twice daily.  Recommend repeat EGD with esophageal biopsies   Functional dyspepsia: H. pylori negative in the past, gastric emptying study normal.  H. pylori IgG negative.  Continue Protonix 40 mg twice daily before meals. I also recommended trial of tricyclic antidepressant but patient was hesitant to try. Recommended desipramine in the past.  Chronic constipation: Discussed with her about high-fiber diet, fiber supplements, information and samples provided  Colon cancer screening: Repeat in 03/2027    Follow up in 1-2 months   Cephas Darby, MD

## 2019-06-28 ENCOUNTER — Encounter: Payer: Self-pay | Admitting: Gastroenterology

## 2019-06-29 ENCOUNTER — Encounter: Payer: Self-pay | Admitting: Internal Medicine

## 2019-06-29 ENCOUNTER — Other Ambulatory Visit: Payer: Self-pay

## 2019-06-29 ENCOUNTER — Other Ambulatory Visit
Admission: RE | Admit: 2019-06-29 | Discharge: 2019-06-29 | Disposition: A | Discharge status: Home / Self Care | Payer: Managed Care, Other (non HMO) | Source: Ambulatory Visit | Attending: Gastroenterology | Admitting: Gastroenterology

## 2019-06-29 ENCOUNTER — Ambulatory Visit (INDEPENDENT_AMBULATORY_CARE_PROVIDER_SITE_OTHER): Payer: Managed Care, Other (non HMO) | Admitting: Internal Medicine

## 2019-06-29 VITALS — BP 108/64 | HR 82 | Temp 98.0°F | Ht 66.0 in | Wt 194.0 lb

## 2019-06-29 DIAGNOSIS — Z1231 Encounter for screening mammogram for malignant neoplasm of breast: Secondary | ICD-10-CM

## 2019-06-29 DIAGNOSIS — Z0000 Encounter for general adult medical examination without abnormal findings: Secondary | ICD-10-CM | POA: Diagnosis not present

## 2019-06-29 DIAGNOSIS — Z20822 Contact with and (suspected) exposure to covid-19: Secondary | ICD-10-CM | POA: Diagnosis not present

## 2019-06-29 DIAGNOSIS — E782 Mixed hyperlipidemia: Secondary | ICD-10-CM

## 2019-06-29 DIAGNOSIS — R000 Tachycardia, unspecified: Secondary | ICD-10-CM | POA: Diagnosis not present

## 2019-06-29 DIAGNOSIS — R1319 Other dysphagia: Secondary | ICD-10-CM

## 2019-06-29 DIAGNOSIS — Z01812 Encounter for preprocedural laboratory examination: Secondary | ICD-10-CM | POA: Diagnosis not present

## 2019-06-29 DIAGNOSIS — H6691 Otitis media, unspecified, right ear: Secondary | ICD-10-CM

## 2019-06-29 DIAGNOSIS — R1310 Dysphagia, unspecified: Secondary | ICD-10-CM | POA: Diagnosis not present

## 2019-06-29 LAB — POCT URINALYSIS DIPSTICK
Bilirubin, UA: NEGATIVE
Glucose, UA: NEGATIVE
Ketones, UA: NEGATIVE
Nitrite, UA: NEGATIVE
Protein, UA: NEGATIVE
Spec Grav, UA: 1.015 (ref 1.010–1.025)
Urobilinogen, UA: 0.2 E.U./dL
pH, UA: 6.5 (ref 5.0–8.0)

## 2019-06-29 LAB — SARS CORONAVIRUS 2 (TAT 6-24 HRS): SARS Coronavirus 2: NEGATIVE

## 2019-06-29 MED ORDER — AZITHROMYCIN 250 MG PO TABS: ORAL_TABLET | ORAL | 0 refills | Status: AC

## 2019-06-29 MED ORDER — ATORVASTATIN CALCIUM 10 MG PO TABS: 10.0000 mg | ORAL_TABLET | Freq: Every day | ORAL | 3 refills | Status: DC

## 2019-06-29 NOTE — Progress Notes (Signed)
Date:  06/29/2019   Name:  Brittany Baldwin   DOB:  [birth year]   MRN:  [id number]   Chief Complaint: Annual Exam (Just seen GYN and had mammo and breast exam.) Brittany Baldwin is a 49 y.o. female who presents today for her Complete Annual Exam. She feels fairly well. She reports exercising running. She reports she is sleeping fairly well. She denies breast issues and just had a normal mammogram.  Mammogram 05/2019 Pap discontinued Colonoscopy  03/2017  There is no immunization history on file for this patient.  Hyperlipidemia This is a chronic problem. The problem is controlled. Associated symptoms include chest pain. Pertinent negatives include no focal sensory loss, focal weakness, leg pain, myalgias or shortness of breath. Current antihyperlipidemic treatment includes statins. The current treatment provides significant improvement of lipids.  Gastroesophageal Reflux She complains of abdominal pain, chest pain and dysphagia. She reports no coughing or no wheezing. This is a recurrent problem. Pertinent negatives include no fatigue. Past procedures include an EGD. repeat EGD scheduled for this week.  Tachycardia - s/p ineffective ablation.  Followed by cardiology. On Nadolol daily and Propranolol prn.  Lab Results  Component Value Date   CREATININE 0.88 06/23/2018   BUN 7 06/23/2018   NA 141 06/23/2018   K 3.9 06/23/2018   CL 104 06/23/2018   CO2 22 06/23/2018   Lab Results  Component Value Date   CHOL 191 06/23/2018   HDL 53 06/23/2018   LDLCALC 120 (H) 06/23/2018   TRIG 91 06/23/2018   CHOLHDL 3.6 06/23/2018   Lab Results  Component Value Date   TSH 2.940 06/23/2018   No results found for: HGBA1C   Review of Systems  Constitutional: Negative for chills, fatigue and fever.  HENT: Negative for congestion, hearing loss, tinnitus, trouble swallowing and voice change.   Eyes: Negative for visual disturbance.  Respiratory: Negative for cough, chest tightness, shortness of  breath and wheezing.   Cardiovascular: Positive for chest pain. Negative for palpitations and leg swelling.  Gastrointestinal: Positive for abdominal pain, constipation (uses stool softener daily) and dysphagia. Negative for diarrhea and vomiting.  Endocrine: Negative for polydipsia and polyuria.  Genitourinary: Negative for dysuria, frequency, genital sores, vaginal bleeding and vaginal discharge.  Musculoskeletal: Negative for arthralgias, gait problem, joint swelling and myalgias.  Skin: Negative for color change and rash.  Neurological: Negative for dizziness, tremors, focal weakness, light-headedness and headaches.  Hematological: Negative for adenopathy. Does not bruise/bleed easily.  Psychiatric/Behavioral: Negative for dysphoric mood and sleep disturbance. The patient is not nervous/anxious.     Patient Active Problem List   Diagnosis Date Noted  . Acanthosis nigricans 07/30/2018  . Esophageal dysphagia   . Functional dyspepsia 02/20/2017  . Hypercalcemia 01/18/2017  . Muscle spasm of right shoulder 08/05/2016  . Dependent edema 08/05/2016  . Plantar fasciitis, bilateral 08/05/2016  . 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 Elevated parathyroid hormone 07/03/2016  . 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 Tachycardia 05/10/2015  . Hyperlipidemia, mixed 03/28/2014    Allergies  Allergen Reactions  . Codeine Shortness Of Breath  . Hydrocodone-Acetaminophen Rash  . Aspirin Other (See Comments)    Stomach issues  . Diphenhydramine Hcl Other (See Comments)    Through an I.V. Drip in addition to other meds. Caused hallucinations  . Adhesive [Tape] Rash    ELECTRODES FROM HOLTER MONITOR CAUSED RASH    Past Surgical History:  Procedure Laterality Date  . ABDOMINAL HYSTERECTOMY Bilateral 07/02/2015   Procedure: HYSTERECTOMY  ABDOMINAL WITH BS;  Surgeon: Brayton Mars, MD;  Location: [facility];  Service: Gynecology;  Laterality: Bilateral;  . APPENDECTOMY    .  CARDIAC ELECTROPHYSIOLOGY STUDY AND ABLATION    . CHOLECYSTECTOMY    . COLONOSCOPY  2013  . COLONOSCOPY WITH PROPOFOL N/A 03/10/2017   Procedure: COLONOSCOPY WITH PROPOFOL;  Surgeon: Lin Landsman, MD;  Location: [facility];  Service: Endoscopy;  Laterality: N/A;  . CYSTOSCOPY Bilateral 07/02/2015   Procedure: CYSTOSCOPY;  Surgeon: Brayton Mars, MD;  Location: [facility];  Service: Gynecology;  Laterality: Bilateral;  . ESOPHAGEAL DILATION  03/10/2017   Procedure: ESOPHAGEAL DILATION;  Surgeon: Lin Landsman, MD;  Location: [facility];  Service: Endoscopy;;  . ESOPHAGOGASTRODUODENOSCOPY N/A 03/10/2017   Procedure: ESOPHAGOGASTRODUODENOSCOPY (EGD);  Surgeon: Lin Landsman, MD;  Location: [facility];  Service: Endoscopy;  Laterality: N/A;  . LAPAROTOMY N/A 07/11/2015   Procedure: hematoma evacuation;  Surgeon: Brayton Mars, MD;  Location: [facility];  Service: Gynecology;  Laterality: N/A;  . NISSEN FUNDOPLICATION    . TUBAL LIGATION      Social History   Tobacco Use  . Smoking status: Never Smoker  . Smokeless tobacco: Never Used  Substance Use Topics  . Alcohol use: No  . Drug use: No     Medication list has been reviewed and updated.  Current Meds  Medication Sig  . atorvastatin (LIPITOR) 10 MG tablet Take 1 tablet (10 mg total) by mouth daily at 6 PM.  . azelastine (ASTELIN) 0.1 % nasal spray Place 1 spray into both nostrils 2 (two) times daily.  . Biotin 1 MG CAPS Take by mouth.  Marland Kitchen omeprazole (PRILOSEC) 40 MG capsule TAKE ONE CAPSULE BY MOUTH EVERY DAY  . propranolol (INDERAL) 20 MG tablet Take 1 tablet (20 mg total) by mouth daily as needed.    PHQ 2/9 Scores 06/29/2019 02/28/2019 07/30/2018 06/23/2018  PHQ - 2 Score 0 0 0 0    BP Readings from Last 3 Encounters:  06/29/19 108/64  06/27/19 105/66  06/06/19 122/80    Physical Exam Vitals and nursing note reviewed.  Constitutional:      General: She is not in acute  distress.    Appearance: She is well-developed.  HENT:     Head: Normocephalic and atraumatic.     Right Ear: Ear canal normal. Tympanic membrane is erythematous.     Left Ear: Tympanic membrane and ear canal normal.     Nose:     Right Sinus: No maxillary sinus tenderness.     Left Sinus: No maxillary sinus tenderness.  Eyes:     General: No scleral icterus.       Right eye: No discharge.        Left eye: No discharge.     Conjunctiva/sclera: Conjunctivae normal.  Neck:     Thyroid: No thyromegaly.     Vascular: No carotid bruit.  Cardiovascular:     Rate and Rhythm: Normal rate and regular rhythm.     Pulses: Normal pulses.     Heart sounds: Normal heart sounds.  Pulmonary:     Effort: Pulmonary effort is normal. No respiratory distress.     Breath sounds: No wheezing.  Abdominal:     General: Bowel sounds are normal.     Palpations: Abdomen is soft.     Tenderness: There is no abdominal tenderness.  Musculoskeletal:        General: Normal range of motion.  Cervical back: Normal range of motion. No erythema.  Lymphadenopathy:     Cervical: No cervical adenopathy.  Skin:    General: Skin is warm and dry.     Findings: No rash.  Neurological:     Mental Status: She is alert and oriented to person, place, and time.     Cranial Nerves: No cranial nerve deficit.     Sensory: No sensory deficit.     Deep Tendon Reflexes: Reflexes are normal and symmetric.  Psychiatric:        Speech: Speech normal.        Behavior: Behavior normal.        Thought Content: Thought content normal.     Wt Readings from Last 3 Encounters:  06/28/19 198 lb (89.8 kg)  06/29/19 194 lb (88 kg)  06/27/19 198 lb 8 oz (90 kg)    BP 108/64   Pulse 82   Temp 98 F (36.7 C) (Oral)   Ht 5\' 6"  (1.676 m)   Wt 194 lb (88 kg)   LMP 06/26/2015 (Exact Date)   SpO2 98%   BMI 31.31 kg/m   Assessment and Plan: 1. Annual physical exam Normal exam except for weight Continue healthy diet,  exercise - POCT urinalysis dipstick  2. Encounter for screening mammogram for breast cancer Completed in December by GYN  3. Hyperlipidemia, mixed Tolerating statin medication without side effects at this time Continue same therapy without change at this time. - Comprehensive metabolic panel - Lipid panel - atorvastatin (LIPITOR) 10 MG tablet; Take 1 tablet (10 mg total) by mouth daily at 6 PM.  Dispense: 90 tablet; Refill: 3  4. Tachycardia Followed by cardiology Recent rest stress ECHO was normal.  Pt is reassured that she is unlikely to have significant CAD. She should continue statin therapy.  No recommendations for aspirin at this time. - TSH  5. Esophageal dysphagia Being worked up by GI due to dysphagia and chest pain s/p fundiplication - CBC with Differential/Platelet  6. Right otitis media, unspecified otitis media type - azithromycin (ZITHROMAX Z-PAK) 250 MG tablet; UAD  Dispense: 6 each; Refill: 0   Partially dictated using Editor, commissioning. Any errors are unintentional.  Halina Maidens, MD [facility]  06/29/2019

## 2019-06-30 ENCOUNTER — Encounter: Payer: Self-pay | Admitting: Internal Medicine

## 2019-06-30 ENCOUNTER — Encounter: Payer: Self-pay | Admitting: Gastroenterology

## 2019-06-30 LAB — CBC WITH DIFFERENTIAL/PLATELET
Basophils Absolute: 0.1 10*3/uL (ref 0.0–0.2)
Basos: 1 %
EOS (ABSOLUTE): 0.1 10*3/uL (ref 0.0–0.4)
Eos: 1 %
Hematocrit: 41.9 % (ref 34.0–46.6)
Hemoglobin: 13.8 g/dL (ref 11.1–15.9)
Immature Grans (Abs): 0 10*3/uL (ref 0.0–0.1)
Immature Granulocytes: 0 %
Lymphocytes Absolute: 1.8 10*3/uL (ref 0.7–3.1)
Lymphs: 37 %
MCH: 29.7 pg (ref 26.6–33.0)
MCHC: 32.9 g/dL (ref 31.5–35.7)
MCV: 90 fL (ref 79–97)
Monocytes Absolute: 0.6 10*3/uL (ref 0.1–0.9)
Monocytes: 13 %
Neutrophils Absolute: 2.4 10*3/uL (ref 1.4–7.0)
Neutrophils: 48 %
Platelets: 225 10*3/uL (ref 150–450)
RBC: 4.64 x10E6/uL (ref 3.77–5.28)
RDW: 12.5 % (ref 11.7–15.4)
WBC: 4.9 10*3/uL (ref 3.4–10.8)

## 2019-06-30 LAB — COMPREHENSIVE METABOLIC PANEL
ALT: 21 IU/L (ref 0–32)
AST: 17 IU/L (ref 0–40)
Albumin/Globulin Ratio: 2.3 — ABNORMAL HIGH (ref 1.2–2.2)
Albumin: 4.4 g/dL (ref 3.8–4.8)
Alkaline Phosphatase: 46 IU/L (ref 39–117)
BUN/Creatinine Ratio: 9 (ref 9–23)
BUN: 9 mg/dL (ref 6–24)
Bilirubin Total: 0.6 mg/dL (ref 0.0–1.2)
CO2: 21 mmol/L (ref 20–29)
Calcium: 10.3 mg/dL — ABNORMAL HIGH (ref 8.7–10.2)
Chloride: 104 mmol/L (ref 96–106)
Creatinine, Ser: 0.96 mg/dL (ref 0.57–1.00)
GFR calc Af Amer: 80 mL/min/{1.73_m2} (ref >59)
GFR calc non Af Amer: 70 mL/min/{1.73_m2} (ref >59)
Globulin, Total: 1.9 g/dL (ref 1.5–4.5)
Glucose: 103 mg/dL — ABNORMAL HIGH (ref 65–99)
Potassium: 4.3 mmol/L (ref 3.5–5.2)
Sodium: 138 mmol/L (ref 134–144)
Total Protein: 6.3 g/dL (ref 6.0–8.5)

## 2019-06-30 LAB — LIPID PANEL
Chol/HDL Ratio: 3.6 ratio (ref 0.0–4.4)
Cholesterol, Total: 181 mg/dL (ref 100–199)
HDL: 50 mg/dL (ref >39)
LDL Chol Calc (NIH): 116 mg/dL — ABNORMAL HIGH (ref 0–99)
Triglycerides: 78 mg/dL (ref 0–149)
VLDL Cholesterol Cal: 15 mg/dL (ref 5–40)

## 2019-06-30 LAB — TSH: TSH: 2.81 u[IU]/mL (ref 0.450–4.500)

## 2019-06-30 NOTE — Telephone Encounter (Signed)
Please advise 

## 2019-06-30 NOTE — Discharge Instructions (Signed)
General Anesthesia, Adult, Care After This sheet gives you information about how to care for yourself after your procedure. Your health care provider may also give you more specific instructions. If you have problems or questions, contact your health care provider. What can I expect after the procedure? After the procedure, the following side effects are common:  Pain or discomfort at the IV site.  Nausea.  Vomiting.  Sore throat.  Trouble concentrating.  Feeling cold or chills.  Weak or tired.  Sleepiness and fatigue.  Soreness and body aches. These side effects can affect parts of the body that were not involved in surgery. Follow these instructions at home:  For at least 24 hours after the procedure:  Have a responsible adult stay with you. It is important to have someone help care for you until you are awake and alert.  Rest as needed.  Do not: ? Participate in activities in which you could fall or become injured. ? Drive. ? Use heavy machinery. ? Drink alcohol. ? Take sleeping pills or medicines that cause drowsiness. ? Make important decisions or sign legal documents. ? Take care of children on your own. Eating and drinking  Follow any instructions from your health care provider about eating or drinking restrictions.  When you feel hungry, start by eating small amounts of foods that are soft and easy to digest (bland), such as toast. Gradually return to your regular diet.  Drink enough fluid to keep your urine pale yellow.  If you vomit, rehydrate by drinking water, juice, or clear broth. General instructions  If you have sleep apnea, surgery and certain medicines can increase your risk for breathing problems. Follow instructions from your health care provider about wearing your sleep device: ? Anytime you are sleeping, including during daytime naps. ? While taking prescription pain medicines, sleeping medicines, or medicines that make you drowsy.  Return to  your normal activities as told by your health care provider. Ask your health care provider what activities are safe for you.  Take over-the-counter and prescription medicines only as told by your health care provider.  If you smoke, do not smoke without supervision.  Keep all follow-up visits as told by your health care provider. This is important. Contact a health care provider if:  You have nausea or vomiting that does not get better with medicine.  You cannot eat or drink without vomiting.  You have pain that does not get better with medicine.  You are unable to pass urine.  You develop a skin rash.  You have a fever.  You have redness around your IV site that gets worse. Get help right away if:  You have difficulty breathing.  You have chest pain.  You have blood in your urine or stool, or you vomit blood. Summary  After the procedure, it is common to have a sore throat or nausea. It is also common to feel tired.  Have a responsible adult stay with you for the first 24 hours after general anesthesia. It is important to have someone help care for you until you are awake and alert.  When you feel hungry, start by eating small amounts of foods that are soft and easy to digest (bland), such as toast. Gradually return to your regular diet.  Drink enough fluid to keep your urine pale yellow.  Return to your normal activities as told by your health care provider. Ask your health care provider what activities are safe for you. This information is not   intended to replace advice given to you by your health care provider. Make sure you discuss any questions you have with your health care provider. Document Revised: 05/22/2017 Document Reviewed: 01/02/2017 Elsevier Patient Education  2020 [employer].  

## 2019-07-01 ENCOUNTER — Other Ambulatory Visit: Payer: Self-pay

## 2019-07-01 ENCOUNTER — Encounter
Admission: RE | Disposition: A | Discharge status: Home / Self Care | Payer: Self-pay | Source: Ambulatory Visit | Attending: Gastroenterology

## 2019-07-01 ENCOUNTER — Ambulatory Visit
Admission: RE | Admit: 2019-07-01 | Discharge: 2019-07-01 | Disposition: A | Discharge status: Home / Self Care | Payer: Managed Care, Other (non HMO) | Source: Ambulatory Visit | Attending: Gastroenterology | Admitting: Gastroenterology

## 2019-07-01 ENCOUNTER — Encounter: Payer: Self-pay | Admitting: *Deleted

## 2019-07-01 ENCOUNTER — Ambulatory Visit: Payer: Managed Care, Other (non HMO) | Admitting: Anesthesiology

## 2019-07-01 DIAGNOSIS — K219 Gastro-esophageal reflux disease without esophagitis: Secondary | ICD-10-CM | POA: Diagnosis not present

## 2019-07-01 DIAGNOSIS — R1310 Dysphagia, unspecified: Secondary | ICD-10-CM | POA: Insufficient documentation

## 2019-07-01 DIAGNOSIS — Z9889 Other specified postprocedural states: Secondary | ICD-10-CM | POA: Diagnosis not present

## 2019-07-01 DIAGNOSIS — Z79899 Other long term (current) drug therapy: Secondary | ICD-10-CM | POA: Insufficient documentation

## 2019-07-01 DIAGNOSIS — E785 Hyperlipidemia, unspecified: Secondary | ICD-10-CM | POA: Insufficient documentation

## 2019-07-01 DIAGNOSIS — R0789 Other chest pain: Secondary | ICD-10-CM | POA: Diagnosis present

## 2019-07-01 DIAGNOSIS — Z8249 Family history of ischemic heart disease and other diseases of the circulatory system: Secondary | ICD-10-CM | POA: Insufficient documentation

## 2019-07-01 DIAGNOSIS — Z86711 Personal history of pulmonary embolism: Secondary | ICD-10-CM | POA: Diagnosis not present

## 2019-07-01 HISTORY — PX: ESOPHAGOGASTRODUODENOSCOPY (EGD) WITH PROPOFOL: SHX5813

## 2019-07-01 SURGERY — ESOPHAGOGASTRODUODENOSCOPY (EGD) WITH PROPOFOL
Anesthesia: General | Site: Throat

## 2019-07-01 MED ORDER — LIDOCAINE HCL (CARDIAC) PF 100 MG/5ML IV SOSY
PREFILLED_SYRINGE | INTRAVENOUS | Status: DC | PRN
  Administered 2019-07-01: 40 mg via INTRAVENOUS

## 2019-07-01 MED ORDER — STERILE WATER FOR IRRIGATION IR SOLN
Status: DC | PRN
  Administered 2019-07-01: .05 mL

## 2019-07-01 MED ORDER — PROPOFOL 10 MG/ML IV BOLUS
INTRAVENOUS | Status: DC | PRN
  Administered 2019-07-01: 50 mg via INTRAVENOUS
  Administered 2019-07-01: 70 mg via INTRAVENOUS
  Administered 2019-07-01: 30 mg via INTRAVENOUS

## 2019-07-01 MED ORDER — GLYCOPYRROLATE 0.2 MG/ML IJ SOLN
INTRAMUSCULAR | Status: DC | PRN
  Administered 2019-07-01: .2 mg via INTRAVENOUS

## 2019-07-01 MED ORDER — LACTATED RINGERS IV SOLN
10.0000 mL/h | INTRAVENOUS | Status: DC
  Administered 2019-07-01: 10 mL/h via INTRAVENOUS

## 2019-07-01 SURGICAL SUPPLY — 7 items
BLOCK BITE 60FR ADLT L/F GRN (MISCELLANEOUS) ×3 IMPLANT
CANISTER SUCT 1200ML W/VALVE (MISCELLANEOUS) ×3 IMPLANT
FORCEPS BIOP RAD 4 LRG CAP 4 (CUTTING FORCEPS) ×3 IMPLANT
GOWN CVR UNV OPN BCK APRN NK (MISCELLANEOUS) ×2 IMPLANT
GOWN ISOL THUMB LOOP REG UNIV (MISCELLANEOUS) ×4
KIT ENDO PROCEDURE OLY (KITS) ×3 IMPLANT
WATER STERILE IRR 250ML POUR (IV SOLUTION) ×3 IMPLANT

## 2019-07-01 NOTE — Telephone Encounter (Signed)
Please advise 

## 2019-07-01 NOTE — Anesthesia Postprocedure Evaluation (Signed)
Anesthesia Post Note  Patient: Donnesha Chiarella  Procedure(s) Performed: ESOPHAGOGASTRODUODENOSCOPY (EGD) WITH PROPOFOL (N/A Throat)     Patient location during evaluation: PACU Anesthesia Type: General Level of consciousness: awake and alert and oriented Pain management: satisfactory to patient Vital Signs Assessment: post-procedure vital signs reviewed and stable Respiratory status: spontaneous breathing, nonlabored ventilation and respiratory function stable Cardiovascular status: blood pressure returned to baseline and stable Postop Assessment: Adequate PO intake and No signs of nausea or vomiting Anesthetic complications: no    Raliegh Ip

## 2019-07-01 NOTE — Op Note (Signed)
[facility] Patient Name: Brittany Baldwin Procedure Date: 07/01/2019 10:04 AM MRN: [id number] Account #: [id number] Date of Birth: Oct 20, 1970 Admit Type: Outpatient Age: 49 Room: [facility] OR ROOM 01 Gender: Female Note Status: Finalized Procedure:             Upper GI endoscopy Indications:           Dysphagia, Chest pain (non cardiac) Providers:             Lin Landsman MD, MD Referring MD:          Halina Maidens, MD (Referring MD) Medicines:             Monitored Anesthesia Care Complications:         No immediate complications. Estimated blood loss: None. Procedure:             Pre-Anesthesia Assessment:                        - Prior to the procedure, a History and Physical was                         performed, and patient medications and allergies were                         reviewed. The patient is competent. The risks and                         benefits of the procedure and the sedation options and                         risks were discussed with the patient. All questions                         were answered and informed consent was obtained.                         Patient identification and proposed procedure were                         verified by the physician, the nurse, the                         anesthesiologist, the anesthetist and the technician                         in the pre-procedure area in the procedure room in the                         endoscopy suite. Mental Status Examination: alert and                         oriented. Airway Examination: normal oropharyngeal                         airway and neck mobility. Respiratory Examination:                         clear to auscultation. CV Examination: normal.  Prophylactic Antibiotics: The patient does not require                         prophylactic antibiotics. Prior Anticoagulants: The                         patient has taken no previous  anticoagulant or                         antiplatelet agents. ASA Grade Assessment: II - A                         patient with mild systemic disease. After reviewing                         the risks and benefits, the patient was deemed in                         satisfactory condition to undergo the procedure. The                         anesthesia plan was to use monitored anesthesia care                         (MAC). Immediately prior to administration of                         medications, the patient was re-assessed for adequacy                         to receive sedatives. The heart rate, respiratory                         rate, oxygen saturations, blood pressure, adequacy of                         pulmonary ventilation, and response to care were                         monitored throughout the procedure. The physical                         status of the patient was re-assessed after the                         procedure.                        After obtaining informed consent, the endoscope was                         passed under direct vision. Throughout the procedure,                         the patient's blood pressure, pulse, and oxygen                         saturations were monitored continuously. The was  introduced through the mouth, and advanced to the                         second part of duodenum. The upper GI endoscopy was                         accomplished without difficulty. The patient tolerated                         the procedure well. Findings:      The duodenal bulb and second portion of the duodenum were normal.      Evidence of a Nissen fundoplication was found in the gastric fundus. The       wrap appeared intact. This was traversed.      The stomach was normal. Biopsies were taken with a cold forceps for       Helicobacter pylori testing.      The esophagus was normal. Biopsies were obtained from the proximal and        distal esophagus with cold forceps for histology of suspected       eosinophilic esophagitis. Impression:            - Normal duodenal bulb and second portion of the                         duodenum.                        - A Nissen fundoplication was found. The wrap appears                         intact.                        - Normal stomach. Biopsied.                        - Normal esophagus. Biopsied. Recommendation:        - Discharge patient to home (with escort).                        - Resume previous diet today.                        - Continue present medications.                        - Await pathology results.                        - Return to my office as previously scheduled. Procedure Code(s):     --- Professional ---                        [phone], Esophagogastroduodenoscopy, flexible,                         transoral; with biopsy, single or multiple Diagnosis Code(s):     --- Professional ---                        [phone], Other specified postprocedural states  R13.10, Dysphagia, unspecified                        R07.89, Other chest pain CPT copyright 2019 American [employer]. All rights reserved. The codes documented in this report are preliminary and upon coder review may  be revised to meet current compliance requirements. Dr. Ulyess Mort Lin Landsman MD, MD 07/01/2019 10:17:07 AM This report has been signed electronically. Number of Addenda: 0 Note Initiated On: 07/01/2019 10:04 AM Estimated Blood Loss:  Estimated blood loss: none.      [facility]

## 2019-07-01 NOTE — H&P (Signed)
Cephas Darby, MD [address]  [place]  [place], [place] [address]  Main: [phone]  Fax: [phone] Pager: [phone]  Primary Care Physician:  Glean Hess, MD Primary Gastroenterologist:  Dr. Cephas Darby  Pre-Procedure History & Physical: HPI:  Brittany Baldwin is a 49 y.o. female is here for an endoscopy   Past Medical History:  Diagnosis Date  . Anemia    H/O   . Anxiety   . Dysrhythmia    SVT-HAD CARDIAC ABLATION DONE IN 2016 BUT PT STATES SHE STILL GETS INTERMITTENT TACHYCARDIC AND IS SYPMPTOMATIC WITH SOB, DIZZINESS DURING TACHY EPISODES (06-25-15)  . Fibroid   . GERD (gastroesophageal reflux disease)   . Headache    H/O  . Heart murmur    as child  . History of fundoplication 123XX123  . History of pulmonary embolus during pregnancy 05/16/2015   2001  . Hoarseness, persistent 10/06/2017  . Hyperlipemia   . Motion sickness    cars, boats  . PE (pulmonary thromboembolism) ([place]) 2000   while on bed rest during pregnancy  . PONV (postoperative nausea and vomiting)   . Shortness of breath dyspnea    ASSOCIATED WITH TACHYCARDIA  . Status post abdominal hysterectomy 07/02/2015   Status post TAH, bilateral salpingectomy. PATHOLOGY: Uterine fibroids and adenomyosis   . Tachycardia     Past Surgical History:  Procedure Laterality Date  . ABDOMINAL HYSTERECTOMY Bilateral 07/02/2015   Procedure: HYSTERECTOMY ABDOMINAL WITH BS;  Surgeon: Brayton Mars, MD;  Location: [facility];  Service: Gynecology;  Laterality: Bilateral;  . APPENDECTOMY    . CARDIAC ELECTROPHYSIOLOGY STUDY AND ABLATION    . CHOLECYSTECTOMY    . COLONOSCOPY  2013  . COLONOSCOPY WITH PROPOFOL N/A 03/10/2017   Procedure: COLONOSCOPY WITH PROPOFOL;  Surgeon: Lin Landsman, MD;  Location: [place];  Service: Endoscopy;  Laterality: N/A;  . CYSTOSCOPY Bilateral 07/02/2015   Procedure: CYSTOSCOPY;  Surgeon: Brayton Mars, MD;  Location: [facility];  Service:  Gynecology;  Laterality: Bilateral;  . ESOPHAGEAL DILATION  03/10/2017   Procedure: ESOPHAGEAL DILATION;  Surgeon: Lin Landsman, MD;  Location: [place];  Service: Endoscopy;;  . ESOPHAGOGASTRODUODENOSCOPY N/A 03/10/2017   Procedure: ESOPHAGOGASTRODUODENOSCOPY (EGD);  Surgeon: Lin Landsman, MD;  Location: [place];  Service: Endoscopy;  Laterality: N/A;  . LAPAROTOMY N/A 07/11/2015   Procedure: hematoma evacuation;  Surgeon: Brayton Mars, MD;  Location: [facility];  Service: Gynecology;  Laterality: N/A;  . NISSEN FUNDOPLICATION    . TUBAL LIGATION      Prior to Admission medications   Medication Sig Start Date End Date Taking? Authorizing Provider  atorvastatin (LIPITOR) 10 MG tablet Take 1 tablet (10 mg total) by mouth daily at 6 PM. 06/29/19  Yes Glean Hess, MD  azelastine (ASTELIN) 0.1 % nasal spray Place 1 spray into both nostrils 2 (two) times daily. 04/15/19  Yes [provider]  Biotin 1 MG CAPS Take by mouth.   Yes [provider]  nadolol (CORGARD) 40 MG tablet Take 40 mg by mouth 2 (two) times daily.   Yes [provider]  omeprazole (PRILOSEC) 40 MG capsule TAKE ONE CAPSULE BY MOUTH EVERY DAY 06/21/19  Yes Glean Hess, MD  propranolol (INDERAL) 20 MG tablet Take 1 tablet (20 mg total) by mouth daily as needed. 06/23/18  Yes Glean Hess, MD  azithromycin (ZITHROMAX Z-PAK) 250 MG tablet UAD 06/29/19 07/04/19  Glean Hess, MD  senna (SENOKOT) 8.6 MG tablet Take 1 tablet by mouth 2 (two) times daily.    [provider]    Allergies as of 06/27/2019 - Review Complete 06/27/2019  Allergen Reaction Noted  . Codeine Shortness Of Breath 05/09/2015  . Hydrocodone-acetaminophen Rash 05/09/2015  . Aspirin Other (See Comments) 05/09/2015  . Diphenhydramine hcl Other (See Comments) 05/09/2015  . Adhesive [tape] Rash 06/25/2015    Family History  Problem Relation Age of Onset  . Diabetes Mother    . Prostate cancer Father   . Congestive Heart Failure Father   . Heart failure Father   . Cancer Neg Hx     Social History   Socioeconomic History  . Marital status: Married    Spouse name: Not on file  . Number of children: Not on file  . Years of education: Not on file  . Highest education level: Not on file  Occupational History  . Not on file  Tobacco Use  . Smoking status: Never Smoker  . Smokeless tobacco: Never Used  Substance and Sexual Activity  . Alcohol use: No  . Drug use: No  . Sexual activity: Yes    Birth control/protection: Surgical  Other Topics Concern  . Not on file  Social History Narrative  . Not on file   Social Determinants of Health   Financial Resource Strain:   . Difficulty of Paying Living Expenses: Not on file  Food Insecurity:   . Worried About Charity fundraiser in the Last Year: Not on file  . Ran Out of Food in the Last Year: Not on file  Transportation Needs:   . Lack of Transportation (Medical): Not on file  . Lack of Transportation (Non-Medical): Not on file  Physical Activity:   . Days of Exercise per Week: Not on file  . Minutes of Exercise per Session: Not on file  Stress:   . Feeling of Stress : Not on file  Social Connections:   . Frequency of Communication with Friends and Family: Not on file  . Frequency of Social Gatherings with Friends and Family: Not on file  . Attends Religious Services: Not on file  . Active Member of Clubs or Organizations: Not on file  . Attends Archivist Meetings: Not on file  . Marital Status: Not on file  Intimate Partner Violence:   . Fear of Current or Ex-Partner: Not on file  . Emotionally Abused: Not on file  . Physically Abused: Not on file  . Sexually Abused: Not on file    Review of Systems: See HPI, otherwise negative ROS  Physical Exam: BP 103/67   Pulse 67   Temp 97.9 F (36.6 C) (Temporal)   Ht 5\' 6"  (1.676 m)   Wt 87.5 kg   LMP 06/26/2015 (Exact Date)    SpO2 100%   BMI 31.15 kg/m  General:   Alert,  pleasant and cooperative in NAD Head:  Normocephalic and atraumatic. Neck:  Supple; no masses or thyromegaly. Lungs:  Clear throughout to auscultation.    Heart:  Regular rate and rhythm. Abdomen:  Soft, nontender and nondistended. Normal bowel sounds, without guarding, and without rebound.   Neurologic:  Alert and  oriented x4;  grossly normal neurologically.  Impression/Plan: Brittany Baldwin is here for an endoscopy to be performed for atypical chest pain, dysphagia  Risks, benefits, limitations, and alternatives regarding  endoscopy have been reviewed with the patient.  Questions have been answered.  All parties agreeable.  Sherri Sear, MD  07/01/2019, 8:55 AM

## 2019-07-01 NOTE — Anesthesia Preprocedure Evaluation (Signed)
Anesthesia Evaluation  Patient identified by MRN, date of birth, ID band Patient awake    Reviewed: Allergy & Precautions, H&P , NPO status , Patient's Chart, lab work & pertinent test results  Airway Mallampati: II  TM Distance: >3 FB Neck ROM: full    Dental no notable dental hx.    Pulmonary    Pulmonary exam normal breath sounds clear to auscultation       Cardiovascular + dysrhythmias Supra Ventricular Tachycardia  Rhythm:regular Rate:Normal     Neuro/Psych    GI/Hepatic GERD  ,  Endo/Other    Renal/GU      Musculoskeletal   Abdominal   Peds  Hematology   Anesthesia Other Findings   Reproductive/Obstetrics                             Anesthesia Physical Anesthesia Plan  ASA: II  Anesthesia Plan: General   Post-op Pain Management:    Induction: Intravenous  PONV Risk Score and Plan: 3 and Treatment may vary due to age or medical condition, TIVA and Propofol infusion  Airway Management Planned: Natural Airway  Additional Equipment:   Intra-op Plan:   Post-operative Plan:   Informed Consent: I have reviewed the patients History and Physical, chart, labs and discussed the procedure including the risks, benefits and alternatives for the proposed anesthesia with the patient or authorized representative who has indicated his/her understanding and acceptance.     Dental Advisory Given  Plan Discussed with: CRNA  Anesthesia Plan Comments:         Anesthesia Quick Evaluation

## 2019-07-01 NOTE — Transfer of Care (Signed)
Immediate Anesthesia Transfer of Care Note  Patient: Brittany Baldwin  Procedure(s) Performed: ESOPHAGOGASTRODUODENOSCOPY (EGD) WITH PROPOFOL (N/A Throat)  Patient Location: PACU  Anesthesia Type: General  Level of Consciousness: awake, alert  and patient cooperative  Airway and Oxygen Therapy: Patient Spontanous Breathing and Patient connected to supplemental oxygen  Post-op Assessment: Post-op Vital signs reviewed, Patient's Cardiovascular Status Stable, Respiratory Function Stable, Patent Airway and No signs of Nausea or vomiting  Post-op Vital Signs: Reviewed and stable  Complications: No apparent anesthesia complications

## 2019-07-05 LAB — SURGICAL PATHOLOGY

## 2019-07-06 ENCOUNTER — Encounter: Payer: Self-pay | Admitting: Gastroenterology

## 2019-07-13 ENCOUNTER — Encounter: Payer: Self-pay | Admitting: Gastroenterology

## 2019-07-13 ENCOUNTER — Ambulatory Visit (INDEPENDENT_AMBULATORY_CARE_PROVIDER_SITE_OTHER): Payer: Managed Care, Other (non HMO) | Admitting: Gastroenterology

## 2019-07-13 DIAGNOSIS — R1013 Epigastric pain: Secondary | ICD-10-CM

## 2019-07-13 DIAGNOSIS — K5904 Chronic idiopathic constipation: Secondary | ICD-10-CM

## 2019-07-13 DIAGNOSIS — R0789 Other chest pain: Secondary | ICD-10-CM | POA: Diagnosis not present

## 2019-07-13 NOTE — Progress Notes (Signed)
Brittany Sear, MD [address]  [place]  [place], [place] [address]  Main: [phone]  Fax: [phone]    Gastroenterology Consultation Video Visit  Referring Provider:     Glean Hess, MD Primary Care Physician:  Glean Hess, MD Primary Gastroenterologist:  Dr. Cephas Darby Reason for Consultation: Atypical chest pain, follow-up results        HPI:   Brittany Baldwin is a 49 y.o. female referred by Dr. Army Melia, Jesse Sans, MD  for consultation & management of atypical chest pain  Virtual Visit Video Note  I connected with Karin Lieu on 07/13/19 at  9:30 AM EST by video and verified that I am speaking with the correct person using two identifiers.   I discussed the limitations, risks, security and privacy concerns of performing an evaluation and management service by video and the availability of in person appointments. I also discussed with the patient that there may be a patient responsible charge related to this service. The patient expressed understanding and agreed to proceed.  Location of the Patient: Home  Location of the provider: Office  Persons participating in the visit: Patient and provider only   History of Present Illness: Brittany Baldwin underwent EGD with esophageal and gastric biopsies for atypical chest pain and epigastric pain.  EGD was unremarkable including biopsies.  She also reports having an coronary angiogram after the EGD which was unremarkable.  She started running, about 1 mile per day and excited that she is able to do it now.  She thinks her chest pain is getting better.  Also correlates the pain with episodes of tachycardia.  She does not think her symptoms are stress related.  She is currently taking omeprazole 40 mg 2 times a day.  She does report feeling better with regards to her constipation, taking Benefiber 3 times a day along with MiraLAX once a day She is not interested to try tricyclic antidepressant at this time.  She had  questions about what is functional GI problem and I answered her concerns in today's visit  NSAIDs: None  Antiplts/Anticoagulants/Anti thrombotics: None  GI Procedures:  EGD 07/01/2019 - Normal duodenal bulb and second portion of the duodenum. - A Nissen fundoplication was found. The wrap appears intact. - Normal stomach. Biopsied. - Normal esophagus. Biopsied.  DIAGNOSIS:  A. STOMACH; COLD BIOPSY:  - OXYNTIC MUCOSA WITHOUT PATHOLOGIC CHANGES.  - NEGATIVE FOR H. PYLORI, INTESTINAL METAPLASIA, DYSPLASIA, AND  MALIGNANCY.   B. ESOPHAGUS, DISTAL; COLD BIOPSY:  - STRATIFIED SQUAMOUS EPITHELIUM WITHOUT EOSINOPHILS, NEUTROPHILS, OR  REACTIVE CHANGES.  - EXTREMELY SCANT TINY STRIPS OF COLUMNAR EPITHELIUM.  - NEGATIVE FOR GOBLET CELLS, DYSPLASIA, AND MALIGNANCY.   C. ESOPHAGUS, PROXIMAL; COLD BIOPSY:  - STRATIFIED SQUAMOUS EPITHELIUM WITHOUT EOSINOPHILS, NEUTROPHILS, OR  REACTIVE CHANGES.  - NEGATIVE FOR DYSPLASIA AND MALIGNANCY.  Past Medical History:  Diagnosis Date  . Anemia    H/O   . Anxiety   . Dysrhythmia    SVT-HAD CARDIAC ABLATION DONE IN 2016 BUT PT STATES SHE STILL GETS INTERMITTENT TACHYCARDIC AND IS SYPMPTOMATIC WITH SOB, DIZZINESS DURING TACHY EPISODES (06-25-15)  . Fibroid   . GERD (gastroesophageal reflux disease)   . Headache    H/O  . Heart murmur    as child  . History of fundoplication 123XX123  . History of pulmonary embolus during pregnancy 05/16/2015   2001  . Hoarseness, persistent 10/06/2017  . Hyperlipemia   . Motion sickness    cars, boats  .  PE (pulmonary thromboembolism) ([place]) 2000   while on bed rest during pregnancy  . PONV (postoperative nausea and vomiting)   . Shortness of breath dyspnea    ASSOCIATED WITH TACHYCARDIA  . Status post abdominal hysterectomy 07/02/2015   Status post TAH, bilateral salpingectomy. PATHOLOGY: Uterine fibroids and adenomyosis   . Tachycardia     Past Surgical History:  Procedure Laterality Date  .  ABDOMINAL HYSTERECTOMY Bilateral 07/02/2015   Procedure: HYSTERECTOMY ABDOMINAL WITH BS;  Surgeon: Brayton Mars, MD;  Location: [facility];  Service: Gynecology;  Laterality: Bilateral;  . APPENDECTOMY    . CARDIAC ELECTROPHYSIOLOGY STUDY AND ABLATION    . CHOLECYSTECTOMY    . COLONOSCOPY  2013  . COLONOSCOPY WITH PROPOFOL N/A 03/10/2017   Procedure: COLONOSCOPY WITH PROPOFOL;  Surgeon: Lin Landsman, MD;  Location: [place];  Service: Endoscopy;  Laterality: N/A;  . CYSTOSCOPY Bilateral 07/02/2015   Procedure: CYSTOSCOPY;  Surgeon: Brayton Mars, MD;  Location: [facility];  Service: Gynecology;  Laterality: Bilateral;  . ESOPHAGEAL DILATION  03/10/2017   Procedure: ESOPHAGEAL DILATION;  Surgeon: Lin Landsman, MD;  Location: [place];  Service: Endoscopy;;  . ESOPHAGOGASTRODUODENOSCOPY N/A 03/10/2017   Procedure: ESOPHAGOGASTRODUODENOSCOPY (EGD);  Surgeon: Lin Landsman, MD;  Location: [place];  Service: Endoscopy;  Laterality: N/A;  . ESOPHAGOGASTRODUODENOSCOPY (EGD) WITH PROPOFOL N/A 07/01/2019   Procedure: ESOPHAGOGASTRODUODENOSCOPY (EGD) WITH PROPOFOL;  Surgeon: Lin Landsman, MD;  Location: [place];  Service: Endoscopy;  Laterality: N/A;  . LAPAROTOMY N/A 07/11/2015   Procedure: hematoma evacuation;  Surgeon: Brayton Mars, MD;  Location: [facility];  Service: Gynecology;  Laterality: N/A;  . NISSEN FUNDOPLICATION    . TUBAL LIGATION      Current Outpatient Medications:  .  atorvastatin (LIPITOR) 10 MG tablet, Take 1 tablet (10 mg total) by mouth daily at 6 PM., Disp: 90 tablet, Rfl: 3 .  azelastine (ASTELIN) 0.1 % nasal spray, Place 1 spray into both nostrils 2 (two) times daily., Disp: , Rfl:  .  Biotin 1 MG CAPS, Take by mouth., Disp: , Rfl:  .  nadolol (CORGARD) 40 MG tablet, Take 40 mg by mouth 2 (two) times daily., Disp: , Rfl:  .  omeprazole (PRILOSEC) 40 MG capsule, TAKE ONE CAPSULE BY MOUTH EVERY  DAY, Disp: 30 capsule, Rfl: 5 .  propranolol (INDERAL) 20 MG tablet, Take 1 tablet (20 mg total) by mouth daily as needed., Disp: 90 tablet, Rfl: 3 .  estradiol (ESTRACE) 0.1 MG/GM vaginal cream, Place 1 g vaginally 3 (three) times a week., Disp: , Rfl:    Family History  Problem Relation Age of Onset  . Diabetes Mother   . Prostate cancer Father   . Congestive Heart Failure Father   . Heart failure Father   . Cancer Neg Hx      Social History   Tobacco Use  . Smoking status: Never Smoker  . Smokeless tobacco: Never Used  Substance Use Topics  . Alcohol use: No  . Drug use: No    Allergies as of 07/13/2019 - Review Complete 07/13/2019  Allergen Reaction Noted  . Codeine Shortness Of Breath 05/09/2015  . Hydrocodone-acetaminophen Rash 05/09/2015  . Aspirin Other (See Comments) 05/09/2015  . Diphenhydramine hcl Other (See Comments) 05/09/2015  . Adhesive [tape] Rash 06/25/2015     Imaging Studies: Reviewed  Assessment and Plan:   Brittany Baldwin is a 49 y.o. female with history of refractory GERD status post Nissen's  fundoplication is connected on video visit for follow-up of  recurrence of epigastric pain, chronic constipation, chronic dysphagia, atypical chest pain  Atypical chest pain and dyspepsia: Improving Extensive work-up has been done in the past which is negative to date EGD unremarkable including esophageal biopsies Discussed with her that we should wean off PPI if possible, advised her to decrease omeprazole to 40 mg once a day for 2 to 3 weeks then 20 mg once a day for 2 weeks, then stop.  Take Pepcid as needed after that.  Cautioned her about rebound reflux symptoms Patient is agreeable with the plan Encouraged her to continue exercise and adapt healthy eating habits  Chronic constipation: Improving Continue high-fiber diet, fiber supplements and MiraLAX Encouraged regular physical activity   Follow Up Instructions:   I discussed the assessment and  treatment plan with the patient. The patient was provided an opportunity to ask questions and all were answered. The patient agreed with the plan and demonstrated an understanding of the instructions.   The patient was advised to call back or seek an in-person evaluation if the symptoms worsen or if the condition fails to improve as anticipated.  I provided 15 minutes of face-to-face time during this encounter.   Follow up as needed   Cephas Darby, MD

## 2019-07-18 ENCOUNTER — Other Ambulatory Visit: Payer: Self-pay | Admitting: Internal Medicine

## 2019-07-18 DIAGNOSIS — E782 Mixed hyperlipidemia: Secondary | ICD-10-CM

## 2019-08-02 ENCOUNTER — Ambulatory Visit: Payer: Managed Care, Other (non HMO) | Admitting: Gastroenterology

## 2019-08-26 ENCOUNTER — Ambulatory Visit: Payer: Managed Care, Other (non HMO) | Admitting: Gastroenterology

## 2019-08-28 ENCOUNTER — Ambulatory Visit: Payer: Managed Care, Other (non HMO)

## 2019-08-29 ENCOUNTER — Ambulatory Visit: Payer: Managed Care, Other (non HMO) | Attending: Internal Medicine

## 2019-08-29 ENCOUNTER — Ambulatory Visit (INDEPENDENT_AMBULATORY_CARE_PROVIDER_SITE_OTHER): Payer: Managed Care, Other (non HMO) | Admitting: Gastroenterology

## 2019-08-29 ENCOUNTER — Encounter: Payer: Self-pay | Admitting: Gastroenterology

## 2019-08-29 DIAGNOSIS — K5904 Chronic idiopathic constipation: Secondary | ICD-10-CM | POA: Diagnosis not present

## 2019-08-29 DIAGNOSIS — R1013 Epigastric pain: Secondary | ICD-10-CM | POA: Diagnosis not present

## 2019-08-29 DIAGNOSIS — Z23 Encounter for immunization: Secondary | ICD-10-CM

## 2019-08-29 DIAGNOSIS — R0789 Other chest pain: Secondary | ICD-10-CM

## 2019-08-29 NOTE — Progress Notes (Signed)
Sherri Sear, MD [address]  [place], [place] [address]  Main: [phone]  Fax: [phone]    Gastroenterology Consultation Video Visit  Referring Provider:     Glean Hess, MD Primary Care Physician:  Glean Hess, MD Primary Gastroenterologist:  Dr. Cephas Darby Reason for Consultation: Atypical chest pain, follow-up results        HPI:   Niyanna Delvecchio is a 49 y.o. female referred by Dr. Army Melia, Jesse Sans, MD  for consultation & management of atypical chest pain  Virtual Visit Video Note  I connected with Karin Lieu on 08/29/19 at  2:00 PM EDT by video and verified that I am speaking with the correct person using two identifiers.   I discussed the limitations, risks, security and privacy concerns of performing an evaluation and management service by video and the availability of in person appointments. I also discussed with the patient that there may be a patient responsible charge related to this service. The patient expressed understanding and agreed to proceed.  Location of the Patient: Home  Location of the provider: Office  Persons participating in the visit: Patient and provider only   History of Present Illness: Ms. Johnnette Litter underwent EGD with esophageal and gastric biopsies for atypical chest pain and epigastric pain.  EGD was unremarkable including biopsies.  She also reports having an coronary angiogram after the EGD which was unremarkable.  She started running, about 1 mile per day and excited that she is able to do it now.  She thinks her chest pain is getting better.  Also correlates the pain with episodes of tachycardia.  She does not think her symptoms are stress related.  She is currently taking omeprazole 40 mg 2 times a day.  She does report feeling better with regards to her constipation, taking Benefiber 3 times a day along with MiraLAX once a day She is not interested to try tricyclic antidepressant at this time.  She had  questions about what is functional GI problem and I answered her concerns in today's visit  Follow-up visit 08/29/2019 Patient reports doing well with regards to her dyspepsia symptoms.  She was able to wean her off PPI.  Currently off medication for about a week and asymptomatic.  She is still struggling with constipation.  MiraLAX and high-fiber diet with fiber supplements is not helping.  She has to take Senokot to have a bowel movement.  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  She does not have any other concerns today  NSAIDs: None  Antiplts/Anticoagulants/Anti thrombotics: None  GI Procedures:  EGD 07/01/2019 - Normal duodenal bulb and second portion of the duodenum. - A Nissen fundoplication was found. The wrap appears intact. - Normal stomach. Biopsied. - Normal esophagus. Biopsied.  DIAGNOSIS:  A. STOMACH; COLD BIOPSY:  - OXYNTIC MUCOSA WITHOUT PATHOLOGIC CHANGES.  - NEGATIVE FOR H. PYLORI, INTESTINAL METAPLASIA, DYSPLASIA, AND  MALIGNANCY.   B. ESOPHAGUS, DISTAL; COLD BIOPSY:  - STRATIFIED SQUAMOUS EPITHELIUM WITHOUT EOSINOPHILS, NEUTROPHILS, OR  REACTIVE CHANGES.  - EXTREMELY SCANT TINY STRIPS OF COLUMNAR EPITHELIUM.  - NEGATIVE FOR GOBLET CELLS, DYSPLASIA, AND MALIGNANCY.   C. ESOPHAGUS, PROXIMAL; COLD BIOPSY:  - STRATIFIED SQUAMOUS EPITHELIUM WITHOUT EOSINOPHILS, NEUTROPHILS, OR  REACTIVE CHANGES.  - NEGATIVE FOR DYSPLASIA AND MALIGNANCY.  Past Medical History:  Diagnosis Date  . Anemia    H/O   . Anxiety   . Dysrhythmia  SVT-HAD CARDIAC ABLATION DONE IN 2016 BUT PT STATES SHE STILL GETS INTERMITTENT TACHYCARDIC AND IS SYPMPTOMATIC WITH SOB, DIZZINESS DURING TACHY EPISODES (06-25-15)  . Fibroid   . GERD (gastroesophageal reflux disease)   . Headache    H/O  . Heart murmur    as child  . History of fundoplication 123XX123  . History of pulmonary embolus during pregnancy 05/16/2015   2001   . Hoarseness, persistent 10/06/2017  . Hyperlipemia   . Motion sickness    cars, boats  . PE (pulmonary thromboembolism) ([place]) 2000   while on bed rest during pregnancy  . PONV (postoperative nausea and vomiting)   . Shortness of breath dyspnea    ASSOCIATED WITH TACHYCARDIA  . Status post abdominal hysterectomy 07/02/2015   Status post TAH, bilateral salpingectomy. PATHOLOGY: Uterine fibroids and adenomyosis   . Tachycardia     Past Surgical History:  Procedure Laterality Date  . ABDOMINAL HYSTERECTOMY Bilateral 07/02/2015   Procedure: HYSTERECTOMY ABDOMINAL WITH BS;  Surgeon: Brayton Mars, MD;  Location: [facility];  Service: Gynecology;  Laterality: Bilateral;  . APPENDECTOMY    . CARDIAC ELECTROPHYSIOLOGY STUDY AND ABLATION    . CHOLECYSTECTOMY    . COLONOSCOPY  2013  . COLONOSCOPY WITH PROPOFOL N/A 03/10/2017   Procedure: COLONOSCOPY WITH PROPOFOL;  Surgeon: Lin Landsman, MD;  Location: [place];  Service: Endoscopy;  Laterality: N/A;  . CYSTOSCOPY Bilateral 07/02/2015   Procedure: CYSTOSCOPY;  Surgeon: Brayton Mars, MD;  Location: [facility];  Service: Gynecology;  Laterality: Bilateral;  . ESOPHAGEAL DILATION  03/10/2017   Procedure: ESOPHAGEAL DILATION;  Surgeon: Lin Landsman, MD;  Location: [place];  Service: Endoscopy;;  . ESOPHAGOGASTRODUODENOSCOPY N/A 03/10/2017   Procedure: ESOPHAGOGASTRODUODENOSCOPY (EGD);  Surgeon: Lin Landsman, MD;  Location: [place] [facility];  Service: Endoscopy;  Laterality: N/A;  . ESOPHAGOGASTRODUODENOSCOPY (EGD) WITH PROPOFOL N/A 07/01/2019   Procedure: ESOPHAGOGASTRODUODENOSCOPY (EGD) WITH PROPOFOL;  Surgeon: Lin Landsman, MD;  Location: [facility];  Service: Endoscopy;  Laterality: N/A;  . LAPAROTOMY N/A 07/11/2015   Procedure: hematoma evacuation;  Surgeon: Brayton Mars, MD;  Location: [facility];  Service: Gynecology;  Laterality: N/A;  . NISSEN FUNDOPLICATION    .  TUBAL LIGATION      Current Outpatient Medications:  .  atorvastatin (LIPITOR) 10 MG tablet, TAKE 1 TABLET(10 MG) BY MOUTH DAILY AT 6 PM, Disp: 90 tablet, Rfl: 3 .  Biotin 1 MG CAPS, Take by mouth., Disp: , Rfl:  .  nadolol (CORGARD) 40 MG tablet, Take 40 mg by mouth 2 (two) times daily., Disp: , Rfl:  .  propranolol (INDERAL) 20 MG tablet, Take 1 tablet (20 mg total) by mouth daily as needed., Disp: 90 tablet, Rfl: 3 .  azelastine (ASTELIN) 0.1 % nasal spray, Place 1 spray into both nostrils 2 (two) times daily., Disp: , Rfl:  .  estradiol (ESTRACE) 0.1 MG/GM vaginal cream, Place 1 g vaginally 3 (three) times a week., Disp: , Rfl:  .  omeprazole (PRILOSEC) 40 MG capsule, TAKE ONE CAPSULE BY MOUTH EVERY DAY (Patient not taking: Reported on 08/29/2019), Disp: 30 capsule, Rfl: 5   Family History  Problem Relation Age of Onset  . Diabetes Mother   . Prostate cancer Father   . Congestive Heart Failure Father   . Heart failure Father   . Cancer Neg Hx      Social History   Tobacco Use  . Smoking status: Never Smoker  . Smokeless  tobacco: Never Used  Substance Use Topics  . Alcohol use: No  . Drug use: No    Allergies as of 08/29/2019 - Review Complete 08/29/2019  Allergen Reaction Noted  . Codeine Shortness Of Breath 05/09/2015  . Hydrocodone-acetaminophen Rash 05/09/2015  . Aspirin Other (See Comments) 05/09/2015  . Diphenhydramine hcl Other (See Comments) 05/09/2015  . Adhesive [tape] Rash 06/25/2015     Imaging Studies: Reviewed  Assessment and Plan:   Jentri Hulbert is a 49 y.o. female with history of refractory GERD status post Nissen's fundoplication is connected on video visit for follow-up of  recurrence of epigastric pain, chronic constipation, chronic dysphagia, atypical chest pain  Atypical chest pain and dyspepsia: Resolved Weaned off PPI Extensive work-up has been done in the past which is negative to date EGD unremarkable including esophageal  biopsies Encouraged her to continue exercise and adapt healthy eating habits  Chronic constipation: Ongoing issue Continue high-fiber diet, fiber supplements MiraLAX does not help Dependent on Senokot We will try Linzess 290 MCG daily, patient will pick up samples Encouraged regular physical activity   Follow Up Instructions:   I discussed the assessment and treatment plan with the patient. The patient was provided an opportunity to ask questions and all were answered. The patient agreed with the plan and demonstrated an understanding of the instructions.   The patient was advised to call back or seek an in-person evaluation if the symptoms worsen or if the condition fails to improve as anticipated.  I provided 15 minutes of face-to-face time during this encounter.   Follow up as needed   Cephas Darby, MD

## 2019-08-29 NOTE — Progress Notes (Signed)
   Covid-19 Vaccination Clinic  Name:  Yuval Welding    MRN: [id number] DOB: [birth year]  08/29/2019  Ms. Pew was observed post Covid-19 immunization for 15 minutes without incident. She was provided with Vaccine Information Sheet and instruction to access the V-Safe system.   Ms. Borrell was instructed to call 911 with any severe reactions post vaccine: Marland Kitchen Difficulty breathing  . Swelling of face and throat  . A fast heartbeat  . A bad rash all over body  . Dizziness and weakness   Immunizations Administered    Name Date Dose VIS Date Route   Pfizer COVID-19 Vaccine 08/29/2019  3:04 PM 0.3 mL 05/13/2019 Intramuscular   Manufacturer: [place]   Lot: [id number]   [place]: [id number]

## 2019-08-30 ENCOUNTER — Encounter: Payer: Self-pay | Admitting: Gastroenterology

## 2019-09-07 ENCOUNTER — Ambulatory Visit
Admission: EM | Admit: 2019-09-07 | Discharge: 2019-09-07 | Disposition: A | Discharge status: Home / Self Care | Payer: Managed Care, Other (non HMO) | Attending: Family Medicine | Admitting: Family Medicine

## 2019-09-07 ENCOUNTER — Other Ambulatory Visit: Payer: Self-pay

## 2019-09-07 ENCOUNTER — Encounter: Payer: Self-pay | Admitting: Emergency Medicine

## 2019-09-07 DIAGNOSIS — L989 Disorder of the skin and subcutaneous tissue, unspecified: Secondary | ICD-10-CM

## 2019-09-07 DIAGNOSIS — L089 Local infection of the skin and subcutaneous tissue, unspecified: Secondary | ICD-10-CM | POA: Diagnosis not present

## 2019-09-07 MED ORDER — MUPIROCIN 2 % EX OINT: 1.0000 "application " | TOPICAL_OINTMENT | Freq: Two times a day (BID) | CUTANEOUS | 0 refills | Status: AC

## 2019-09-07 MED ORDER — DOXYCYCLINE HYCLATE 100 MG PO CAPS: 100.0000 mg | ORAL_CAPSULE | Freq: Two times a day (BID) | ORAL | 0 refills | Status: DC

## 2019-09-07 NOTE — ED Triage Notes (Signed)
Pt has insect bite on her left thumb and right middle finger. Started about 4 days ago. Started out like a mosquito bite and then blistered up and popped.

## 2019-09-07 NOTE — ED Provider Notes (Signed)
[facility]    CSN: [id number] Arrival date & time: 09/07/19  1723      History   Chief Complaint Chief Complaint  Patient presents with  . Insect Bite   HPI  49 year old female presents with the above complaint.  Patient reports that she has an area of concern around the left thumb and the distal aspect of the dorsum of the right middle finger.  She is concerned that she suffered a bug bite but does not recall 1.  She states that it started about 4 days ago.  She has a raised area on the dorsal aspect of the right middle finger just below the nailbed.  It is red and slightly tender.  Additionally, she has an area below the MCP joint of the left thumb on the dorsal aspect.  It is raised.  She states that it started like a blister and subsequently ruptured.  No current drainage.  She is concerned about infection.  No relieving factors.  No other associated symptoms.  No other complaints.  Past Medical History:  Diagnosis Date  . Anemia    H/O   . Anxiety   . Dysrhythmia    SVT-HAD CARDIAC ABLATION DONE IN 2016 BUT PT STATES SHE STILL GETS INTERMITTENT TACHYCARDIC AND IS SYPMPTOMATIC WITH SOB, DIZZINESS DURING TACHY EPISODES (06-25-15)  . Fibroid   . GERD (gastroesophageal reflux disease)   . Headache    H/O  . Heart murmur    as child  . History of fundoplication 123XX123  . History of pulmonary embolus during pregnancy 05/16/2015   2001  . Hoarseness, persistent 10/06/2017  . Hyperlipemia   . Motion sickness    cars, boats  . PE (pulmonary thromboembolism) ([place]) 2000   while on bed rest during pregnancy  . PONV (postoperative nausea and vomiting)   . Shortness of breath dyspnea    ASSOCIATED WITH TACHYCARDIA  . Status post abdominal hysterectomy 07/02/2015   Status post TAH, bilateral salpingectomy. PATHOLOGY: Uterine fibroids and adenomyosis   . Tachycardia     Patient Active Problem List   Diagnosis Date Noted  . Atypical chest pain   . Acanthosis  nigricans 07/30/2018  . Esophageal dysphagia   . Functional dyspepsia 02/20/2017  . Hypercalcemia 01/18/2017  . Muscle spasm of right shoulder 08/05/2016  . Dependent edema 08/05/2016  . Plantar fasciitis, bilateral 08/05/2016  . 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 Elevated parathyroid hormone 07/03/2016  . 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 Tachycardia 05/10/2015  . Hyperlipidemia, mixed 03/28/2014    Past Surgical History:  Procedure Laterality Date  . ABDOMINAL HYSTERECTOMY Bilateral 07/02/2015   Procedure: HYSTERECTOMY ABDOMINAL WITH BS;  Surgeon: Brayton Mars, MD;  Location: [facility];  Service: Gynecology;  Laterality: Bilateral;  . APPENDECTOMY    . CARDIAC ELECTROPHYSIOLOGY STUDY AND ABLATION    . CHOLECYSTECTOMY    . COLONOSCOPY  2013  . COLONOSCOPY WITH PROPOFOL N/A 03/10/2017   Procedure: COLONOSCOPY WITH PROPOFOL;  Surgeon: Lin Landsman, MD;  Location: [place];  Service: Endoscopy;  Laterality: N/A;  . CYSTOSCOPY Bilateral 07/02/2015   Procedure: CYSTOSCOPY;  Surgeon: Brayton Mars, MD;  Location: [facility];  Service: Gynecology;  Laterality: Bilateral;  . ESOPHAGEAL DILATION  03/10/2017   Procedure: ESOPHAGEAL DILATION;  Surgeon: Lin Landsman, MD;  Location: [place];  Service: Endoscopy;;  . ESOPHAGOGASTRODUODENOSCOPY N/A 03/10/2017   Procedure: ESOPHAGOGASTRODUODENOSCOPY (EGD);  Surgeon: Lin Landsman, MD;  Location: [facility];  Service: Endoscopy;  Laterality: N/A;  . ESOPHAGOGASTRODUODENOSCOPY (EGD) WITH PROPOFOL N/A 07/01/2019   Procedure: ESOPHAGOGASTRODUODENOSCOPY (EGD) WITH PROPOFOL;  Surgeon: Lin Landsman, MD;  Location: [place];  Service: Endoscopy;  Laterality: N/A;  . LAPAROTOMY N/A 07/11/2015   Procedure: hematoma evacuation;  Surgeon: Brayton Mars, MD;  Location: [facility];  Service: Gynecology;  Laterality: N/A;  . NISSEN FUNDOPLICATION    . TUBAL LIGATION       OB History    Gravida  8   Para  3   Term  1   Preterm  2   AB  5   Living  1     SAB  4   TAB  1   Ectopic      Multiple      Live Births  1            Home Medications    Prior to Admission medications   Medication Sig Start Date End Date Taking? Authorizing Provider  atorvastatin (LIPITOR) 10 MG tablet TAKE 1 TABLET(10 MG) BY MOUTH DAILY AT 6 PM 07/18/19  Yes Glean Hess, MD  azelastine (ASTELIN) 0.1 % nasal spray Place 1 spray into both nostrils 2 (two) times daily. 04/15/19  Yes [provider]  Biotin 1 MG CAPS Take by mouth.   Yes [provider]  nadolol (CORGARD) 40 MG tablet Take 40 mg by mouth 2 (two) times daily.   Yes [provider]  propranolol (INDERAL) 20 MG tablet Take 1 tablet (20 mg total) by mouth daily as needed. 06/23/18  Yes Glean Hess, MD  doxycycline (VIBRAMYCIN) 100 MG capsule Take 1 capsule (100 mg total) by mouth 2 (two) times daily. 09/07/19   Coral Spikes, DO  mupirocin ointment (BACTROBAN) 2 % Apply 1 application topically 2 (two) times daily for 7 days. 09/07/19 09/14/19  Coral Spikes, DO  omeprazole (PRILOSEC) 40 MG capsule TAKE ONE CAPSULE BY MOUTH EVERY DAY Patient not taking: Reported on 08/29/2019 06/21/19 09/07/19  Glean Hess, MD    Family History Family History  Problem Relation Age of Onset  . Diabetes Mother   . Prostate cancer Father   . Congestive Heart Failure Father   . Heart failure Father   . Cancer Neg Hx     Social History Social History   Tobacco Use  . Smoking status: Never Smoker  . Smokeless tobacco: Never Used  Substance Use Topics  . Alcohol use: No  . Drug use: No     Allergies   Codeine, Hydrocodone-acetaminophen, Aspirin, Diphenhydramine hcl, and Adhesive [tape]   Review of Systems Review of Systems Per HPI  Physical Exam Triage Vital Signs ED Triage Vitals  Enc Vitals Group     BP 09/07/19 1743 116/84     Pulse Rate 09/07/19 1743 78      Resp 09/07/19 1743 18     Temp 09/07/19 1743 98.2 F (36.8 C)     Temp Source 09/07/19 1743 Oral     SpO2 09/07/19 1743 100 %     Weight 09/07/19 1739 192 lb (87.1 kg)     Height 09/07/19 1739 5\' 6"  (1.676 m)     Head Circumference --      Peak Flow --      Pain Score 09/07/19 1739 0     Pain Loc --      Pain Edu? --      Excl. in [place]? --  Updated Vital Signs BP 116/84 (BP Location: Right Arm)   Pulse 78   Temp 98.2 F (36.8 C) (Oral)   Resp 18   Ht 5\' 6"  (1.676 m)   Wt 87.1 kg   LMP 06/26/2015 (Exact Date)   SpO2 100%   BMI 30.99 kg/m   Visual Acuity Right Eye Distance:   Left Eye Distance:   Bilateral Distance:    Right Eye Near:   Left Eye Near:    Bilateral Near:     Physical Exam Vitals and nursing note reviewed.  Constitutional:      General: She is not in acute distress.    Appearance: Normal appearance. She is not ill-appearing.  HENT:     Head: Normocephalic and atraumatic.  Eyes:     General:        Right eye: No discharge.        Left eye: No discharge.     Conjunctiva/sclera: Conjunctivae normal.  Pulmonary:     Effort: Pulmonary effort is normal. No respiratory distress.  Skin:    Comments: Raised area of erythema below the nailbed of the right middle finger.  Left hand with a raised skin lesion with mild eschar below the MCP joint of the left thumb.  Neurological:     Mental Status: She is alert.  Psychiatric:        Mood and Affect: Mood normal.        Behavior: Behavior normal.    UC Treatments / Results  Labs (all labs ordered are listed, but only abnormal results are displayed) Labs Reviewed - No data to display  EKG   Radiology No results found.  Procedures Procedures (including critical care time)  Medications Ordered in UC Medications - No data to display  Initial Impression / Assessment and Plan / UC Course  I have reviewed the triage vital signs and the nursing notes.  Pertinent labs & imaging results that  were available during my care of the patient were reviewed by me and considered in my medical decision making (see chart for details).    49 year old female presents with suspected skin infection.  Doxycycline and Bactroban as prescribed.  If persists, recommend seeing dermatology.  Final Clinical Impressions(s) / UC Diagnoses   Final diagnoses:  Skin lesion  Skin infection     Discharge Instructions     Antibiotic as prescribed.   If persists, see [facility].  Take care  Dr. Lacinda Axon    ED Prescriptions    Medication Sig Dispense Auth. Provider   doxycycline (VIBRAMYCIN) 100 MG capsule Take 1 capsule (100 mg total) by mouth 2 (two) times daily. 14 capsule Asaf Elmquist G, DO   mupirocin ointment (BACTROBAN) 2 % Apply 1 application topically 2 (two) times daily for 7 days. 22 g Coral Spikes, DO     PDMP not reviewed this encounter.   Coral Spikes, [place] 09/07/19 1920

## 2019-09-07 NOTE — Discharge Instructions (Signed)
Antibiotic as prescribed.   If persists, see [facility].  Take care  Dr. Lacinda Axon

## 2019-09-15 ENCOUNTER — Encounter: Payer: Self-pay | Admitting: Gastroenterology

## 2019-09-15 MED ORDER — LUBIPROSTONE 24 MCG PO CAPS: 24.0000 ug | ORAL_CAPSULE | Freq: Two times a day (BID) | ORAL | 1 refills | Status: AC

## 2019-09-15 NOTE — Telephone Encounter (Signed)
Last office visit 08/29/2019 Chronic constipation  Last refill Gave samples at appointment

## 2019-09-21 ENCOUNTER — Ambulatory Visit: Payer: Managed Care, Other (non HMO) | Attending: Internal Medicine

## 2019-09-21 DIAGNOSIS — Z23 Encounter for immunization: Secondary | ICD-10-CM

## 2019-09-21 NOTE — Progress Notes (Signed)
   Covid-19 Vaccination Clinic  Name:  Brittany Baldwin    MRN: [id number] DOB: 1970/12/27  09/21/2019  [name] was observed post Covid-19 immunization for 15 minutes without incident. She was provided with Vaccine Information Sheet and instruction to access the V-Safe system.   [name] was instructed to call 911 with any severe reactions post vaccine: Marland Kitchen Difficulty breathing  . Swelling of face and throat  . A fast heartbeat  . A bad rash all over body  . Dizziness and weakness   Immunizations Administered    Name Date Dose VIS Date Route   Pfizer COVID-19 Vaccine 09/21/2019 12:39 PM 0.3 mL 07/27/2018 Intramuscular   Manufacturer: [place]   Lot: [id number]   [place]: [id number]

## 2019-10-08 ENCOUNTER — Ambulatory Visit: Payer: Managed Care, Other (non HMO)

## 2019-11-30 ENCOUNTER — Ambulatory Visit: Payer: Managed Care, Other (non HMO) | Admitting: Internal Medicine

## 2019-11-30 ENCOUNTER — Other Ambulatory Visit: Payer: Self-pay

## 2019-11-30 ENCOUNTER — Encounter: Payer: Self-pay | Admitting: Internal Medicine

## 2019-11-30 ENCOUNTER — Ambulatory Visit (INDEPENDENT_AMBULATORY_CARE_PROVIDER_SITE_OTHER): Payer: Managed Care, Other (non HMO)

## 2019-11-30 VITALS — BP 118/78 | HR 78 | Temp 98.3°F | Ht 66.0 in | Wt 197.8 lb

## 2019-11-30 DIAGNOSIS — J382 Nodules of vocal cords: Secondary | ICD-10-CM

## 2019-11-30 DIAGNOSIS — Z87898 Personal history of other specified conditions: Secondary | ICD-10-CM | POA: Diagnosis not present

## 2019-11-30 DIAGNOSIS — Z825 Family history of asthma and other chronic lower respiratory diseases: Secondary | ICD-10-CM

## 2019-11-30 DIAGNOSIS — R0600 Dyspnea, unspecified: Secondary | ICD-10-CM | POA: Diagnosis not present

## 2019-11-30 DIAGNOSIS — R0609 Other forms of dyspnea: Secondary | ICD-10-CM

## 2019-11-30 LAB — CBC WITH DIFFERENTIAL/PLATELET
Basophils Absolute: 0 10*3/uL (ref 0.0–0.1)
Basophils Relative: 0.7 % (ref 0.0–3.0)
Eosinophils Absolute: 0.1 10*3/uL (ref 0.0–0.7)
Eosinophils Relative: 2.3 % (ref 0.0–5.0)
HCT: 41.3 % (ref 36.0–46.0)
Hemoglobin: 13.7 g/dL (ref 12.0–15.0)
Lymphocytes Relative: 32.7 % (ref 12.0–46.0)
Lymphs Abs: 1.5 10*3/uL (ref 0.7–4.0)
MCHC: 33.3 g/dL (ref 30.0–36.0)
MCV: 90.2 fl (ref 78.0–100.0)
Monocytes Absolute: 0.5 10*3/uL (ref 0.1–1.0)
Monocytes Relative: 11.3 % (ref 3.0–12.0)
Neutro Abs: 2.5 10*3/uL (ref 1.4–7.7)
Neutrophils Relative %: 53 % (ref 43.0–77.0)
Platelets: 205 10*3/uL (ref 150.0–400.0)
RBC: 4.58 Mil/uL (ref 3.87–5.11)
RDW: 13.4 % (ref 11.5–15.5)
WBC: 4.7 10*3/uL (ref 4.0–10.5)

## 2019-11-30 MED ORDER — LUBIPROSTONE 24 MCG PO CAPS: 24.0000 ug | ORAL_CAPSULE | Freq: Two times a day (BID) | ORAL | 1 refills | Status: DC

## 2019-11-30 NOTE — Telephone Encounter (Signed)
Patient calling stating she needs a refill on Amitiza. Sent medication to pharmacy per patient request

## 2019-11-30 NOTE — Progress Notes (Signed)
OV 11/30/2019  Subjective:  Patient ID: Brittany Baldwin, female , DOB: 02-02-1971 , age 49 y.o. , MRN: [id number] , ADDRESS: [place] [id number]   11/30/2019 -   Chief Complaint  Patient presents with  . Consult    pt exercises she wheezes.pt gets sob when doing activities     HPI Brittany Baldwin 49 y.o. -new consult for history of wheezing.  Patient tells me that approximately 1 year ago she noticed insidious onset of wheezing.  This wheezing is present intermittently.  It is present both with exertion and at rest although the exertional component is more.  The wheezing is associated sometimes with dyspnea on exertion.  She gets dyspnea on exertion also randomly for variable levels of exertion such as climbing a flight of stairs.  She will get wheezing for much more severe exertion such as exercising or walking a mile.  But again both dyspnea and wheezing sometimes are associated together and sometimes they are not associated together.  They are also variable in terms of what level of exertion can bring it on.  The wheezing is also present at rest but there is no orthopnea proximal nocturnal dyspnea or waking up in the middle of the night with wheezing.  She denies any external spring pollen allergies or environmental allergies.  We discussed the indoor environment at home and there is no mold or cockroach or mildew or pet birds a feather pillow or feather blankets.  Earlier in 2021 her daughter got diagnosed with asthma.  The daughter is age 70.  Therefore she is concerned she might have asthma.  There is no family history of sarcoidosis.  Walking desaturation test 100% at rest and stayed at 100% for all 3 laps.  She was not tachycardic according to the [place].  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   -She has been evaluated in [facility].  The physician is Dr. Armida Sans this for different issues.  In June 2020 her acetylcholine receptor antibody test  were negative.  She sees ENT at [facility] and follows up with the voice rehabilitation and speech therapy program.  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  Reports in May 2021 that her voice stamina is low and she is undergoing rehabilitation for this.  She reports hoarseness with singinging It appears the voice stamina gets worse with emotional stress.  There is reports of loss of family members.  The dysphonia severity is noted is mild.  The prognosis believed to be excellent.   Last imaging was October 2018 CT abdomen.  The lung images on this was personally visualized by me and they look healthy and normal..  She also had a chest x-ray in July 2017 that looks clear and reported as normal.  She had an echo in October 2017 that shows mild mitral valve leak but normal ejection fraction.   Other issues  -In 2000/2001 during childbirth she had pulmonary embolism.  Echocardiogram in 2017 did not show evidence of pulmonary hypertension.  She had VQ scan in 2016 at [facility] very low probability for PE. -She has seen neurology at [facility] for chronic headaches.  This was towards the end of 2020. -She has a history of tach cardia has seen [place] cardiology in October 2020.  She is generally reluctant to do albuterol unless absolutely needed.  [facility] October 2020 notes indicate she might have POTS.  She had stress echo in January 2021 at [facility] that according to her history and review shows normal  Results for ENEDELIA, MARTORELLI (MRN [id number]) as of 11/30/2019 09:35  Ref. Range 06/29/2019 09:43  Creatinine Latest Ref Range: 0.57 - 1.00 mg/dL 0.96   Results for ANNAI, HEICK (MRN [id number]) as of 11/30/2019 09:35  Ref. Range 06/29/2019 09:43  Hemoglobin Latest Ref Range: 11.1 - 15.9 g/dL 13.8  Results for JAMESETTA, GREENHALGH (MRN [id number]) as of 11/30/2019 09:35  Ref. Range 06/29/2019 09:43  Calcium Latest Ref Range: 8.7 - 10.2 mg/dL 10.3 (H)   ROS - per  HPI     has a past medical history of Anemia, Anxiety, Dysrhythmia, Fibroid, GERD (gastroesophageal reflux disease), Headache, Heart murmur, History of fundoplication (0/93/8182), History of pulmonary embolus during pregnancy (05/16/2015), Hoarseness, persistent (10/06/2017), Hyperlipemia, Motion sickness, PE (pulmonary thromboembolism) ([place]) (2000), PONV (postoperative nausea and vomiting), Shortness of breath dyspnea, Status post abdominal hysterectomy (07/02/2015), and Tachycardia.   reports that she has never smoked. She has never used smokeless tobacco.  Past Surgical History:  Procedure Laterality Date  . ABDOMINAL HYSTERECTOMY Bilateral 07/02/2015   Procedure: HYSTERECTOMY ABDOMINAL WITH BS;  Surgeon: Brayton Mars, MD;  Location: [facility];  Service: Gynecology;  Laterality: Bilateral;  . APPENDECTOMY    . CARDIAC ELECTROPHYSIOLOGY STUDY AND ABLATION    . CHOLECYSTECTOMY    . COLONOSCOPY  2013  . COLONOSCOPY WITH PROPOFOL N/A 03/10/2017   Procedure: COLONOSCOPY WITH PROPOFOL;  Surgeon: Lin Landsman, MD;  Location: [place];  Service: Endoscopy;  Laterality: N/A;  . CYSTOSCOPY Bilateral 07/02/2015   Procedure: CYSTOSCOPY;  Surgeon: Brayton Mars, MD;  Location: [facility];  Service: Gynecology;  Laterality: Bilateral;  . ESOPHAGEAL DILATION  03/10/2017   Procedure: ESOPHAGEAL DILATION;  Surgeon: Lin Landsman, MD;  Location: [place];  Service: Endoscopy;;  . ESOPHAGOGASTRODUODENOSCOPY N/A 03/10/2017   Procedure: ESOPHAGOGASTRODUODENOSCOPY (EGD);  Surgeon: Lin Landsman, MD;  Location: [place];  Service: Endoscopy;  Laterality: N/A;  . ESOPHAGOGASTRODUODENOSCOPY (EGD) WITH PROPOFOL N/A 07/01/2019   Procedure: ESOPHAGOGASTRODUODENOSCOPY (EGD) WITH PROPOFOL;  Surgeon: Lin Landsman, MD;  Location: [place];  Service: Endoscopy;  Laterality: N/A;  . LAPAROTOMY N/A 07/11/2015   Procedure: hematoma evacuation;   Surgeon: Brayton Mars, MD;  Location: [facility];  Service: Gynecology;  Laterality: N/A;  . NISSEN FUNDOPLICATION    . TUBAL LIGATION      Allergies  Allergen Reactions  . Codeine Shortness Of Breath  . Hydrocodone-Acetaminophen Rash  . Aspirin Other (See Comments)    Stomach issues  . Diphenhydramine Hcl Other (See Comments)    Through an I.V. Drip in addition to other meds. Caused hallucinations  . Adhesive [Tape] Rash    ELECTRODES FROM HOLTER MONITOR CAUSED RASH    Immunization History  Administered Date(s) Administered  . PFIZER SARS-COV-2 Vaccination 08/29/2019, 09/21/2019    Family History  Problem Relation Age of Onset  . Diabetes Mother   . Prostate cancer Father   . Congestive Heart Failure Father   . Heart failure Father   . Cancer Neg Hx      Current Outpatient Medications:  .  atorvastatin (LIPITOR) 10 MG tablet, TAKE 1 TABLET(10 MG) BY MOUTH DAILY AT 6 PM, Disp: 90 tablet, Rfl: 3 .  azelastine (ASTELIN) 0.1 % nasal spray, Place 1 spray into both nostrils 2 (two) times daily., Disp: , Rfl:  .  Biotin 1 MG CAPS, Take by mouth., Disp: ,  Rfl:  .  Multiple Vitamins-Minerals (MULTIVITAMIN WITH MINERALS) tablet, Take 1 tablet by mouth daily., Disp: , Rfl:  .  nadolol (CORGARD) 40 MG tablet, Take 40 mg by mouth 2 (two) times daily., Disp: , Rfl:  .  propranolol (INDERAL) 20 MG tablet, Take 1 tablet (20 mg total) by mouth daily as needed., Disp: 90 tablet, Rfl: 3 .  AMITIZA 24 MCG capsule, Take 24 mcg by mouth 2 (two) times daily., Disp: , Rfl:       Objective:   Vitals:   11/30/19 0941  BP: 118/78  Pulse: 78  Temp: 98.3 F (36.8 C)  TempSrc: Oral  SpO2: 96%  Weight: 197 lb 12.8 oz (89.7 kg)  Height: 5\' 6"  (1.676 m)    Estimated body mass index is 31.93 kg/m as calculated from the following:   Height as of this encounter: 5\' 6"  (1.676 m).   Weight as of this encounter: 197 lb 12.8 oz (89.7 kg).  @WEIGHTCHANGE @  [employer]   11/30/19 0941   Weight: 197 lb 12.8 oz (89.7 kg)     Physical Exam  General Appearance:    Alert, cooperative, no distress, appears stated age - yes , Deconditioned looking - no , OBESE  - no, Sitting on Wheelchair -  no  Head:    Normocephalic, without obvious abnormality, atraumatic  Eyes:    PERRL, conjunctiva/corneas clear,  Ears:    Normal TM's and external ear canals, both ears  Nose:   Nares normal, septum midline, mucosa normal, no drainage    or sinus tenderness. OXYGEN ON  - no . Patient is @ ra   Throat:   Lips, mucosa, and tongue normal; teeth and gums normal. Cyanosis on lips - no  Neck:   Supple, symmetrical, trachea midline, no adenopathy;    thyroid:  no enlargement/tenderness/nodules; no carotid   bruit or JVD  Back:     Symmetric, no curvature, ROM normal, no CVA tenderness  Lungs:     Distress - no , Wheeze no, Barrell Chest - no, Purse lip breathing - no, Crackles - no   Chest Wall:    No tenderness or deformity.    Heart:    Regular rate and rhythm, S1 and S2 normal, no rub   or gallop, Murmur - no  Breast Exam:    NOT DONE  Abdomen:     Soft, non-tender, bowel sounds active all four quadrants,    no masses, no organomegaly. Visceral obesity - no  Genitalia:   NOT DONE  Rectal:   NOT DONE  Extremities:   Extremities - normal, Has Cane - no, Clubbing - no, Edema - no  Pulses:   2+ and symmetric all extremities  Skin:   Stigmata of Connective Tissue Disease - no  Lymph nodes:   Cervical, supraclavicular, and axillary nodes normal  Psychiatric:  Neurologic:   Pleasant - yes, Anxious - no, Flat affect - no  CAm-ICU - neg, Alert and Oriented x 3 - yes, Moves all 4s - yes, Speech - normal, Cognition - intact           Assessment:       ICD-10-CM   1. Dyspnea on exertion  R06.00   2. History of wheezing  Z87.898   3. Family history of asthma  Z82.5   4. 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  It appears that  based on her history and physical that the first of which is asthma with or without vocal cord issues of the most likely reasons for her symptoms.-We will order CBC with differential IgE and allergy panel.  Will get chest x-ray.  Will check pulmonary function test and exhaled nitric oxide testing.  If these are noncontributory she might have to do a pulmonary stress test.    Plan:     Patient Instructions     ICD-10-CM   1. Dyspnea on exertion  R06.00   2. History of wheezing  Z87.898   3. Family history of asthma  Z82.5   4. 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 Brand Males, M.D., F.C.C.P,  Pulmonary and Critical Care Medicine Staff Physician, [place] Director - Interstitial Lung Disease  Program  Pulmonary [place] at [place], [place], [address]  Pager: [phone], If no answer or between  15:00h - 7:00h: call [phone] Telephone: [phone]  10:12 AM 11/30/2019

## 2019-11-30 NOTE — Patient Instructions (Signed)
ICD-10-CM   1. Dyspnea on exertion  R06.00   2. History of wheezing  Z87.898   3. Family history of asthma  Z82.5   4. 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

## 2019-12-01 LAB — RESPIRATORY ALLERGY PROFILE REGION II ~~LOC~~

## 2019-12-01 LAB — INTERPRETATION:

## 2019-12-22 ENCOUNTER — Other Ambulatory Visit: Payer: Self-pay

## 2019-12-22 ENCOUNTER — Ambulatory Visit (INDEPENDENT_AMBULATORY_CARE_PROVIDER_SITE_OTHER): Payer: Managed Care, Other (non HMO) | Admitting: Internal Medicine

## 2019-12-22 DIAGNOSIS — R062 Wheezing: Secondary | ICD-10-CM

## 2019-12-22 DIAGNOSIS — R0600 Dyspnea, unspecified: Secondary | ICD-10-CM

## 2019-12-22 DIAGNOSIS — J382 Nodules of vocal cords: Secondary | ICD-10-CM

## 2019-12-22 DIAGNOSIS — Z87898 Personal history of other specified conditions: Secondary | ICD-10-CM

## 2019-12-22 DIAGNOSIS — R0609 Other forms of dyspnea: Secondary | ICD-10-CM

## 2019-12-22 DIAGNOSIS — Z825 Family history of asthma and other chronic lower respiratory diseases: Secondary | ICD-10-CM

## 2019-12-22 LAB — PULMONARY FUNCTION TEST
DL/VA % pred: 117 %
DL/VA: 5 ml/min/mmHg/L
DLCO cor % pred: 99 %
DLCO cor: 22.31 ml/min/mmHg
DLCO unc % pred: 100 %
DLCO unc: 22.51 ml/min/mmHg
FEF 25-75 Post: 2.01 L/sec
FEF 25-75 Pre: 1.76 L/sec
FEF2575-%Change-Post: 14 %
FEF2575-%Pred-Post: 77 %
FEF2575-%Pred-Pre: 67 %
FEV1-%Change-Post: 3 %
FEV1-%Pred-Post: 93 %
FEV1-%Pred-Pre: 90 %
FEV1-Post: 2.36 L
FEV1-Pre: 2.28 L
FEV1FVC-%Change-Post: 6 %
FEV1FVC-%Pred-Pre: 93 %
FEV6-%Change-Post: -3 %
FEV6-%Pred-Post: 94 %
FEV6-%Pred-Pre: 98 %
FEV6-Post: 2.88 L
FEV6-Pre: 3 L
FEV6FVC-%Change-Post: 0 %
FEV6FVC-%Pred-Post: 102 %
FEV6FVC-%Pred-Pre: 102 %
FVC-%Change-Post: -2 %
FVC-%Pred-Post: 93 %
FVC-%Pred-Pre: 95 %
FVC-Post: 2.91 L
FVC-Pre: 3 L
Post FEV1/FVC ratio: 81 %
Post FEV6/FVC ratio: 100 %
Pre FEV1/FVC ratio: 76 %
Pre FEV6/FVC Ratio: 100 %
RV % pred: 81 %
RV: 1.54 L
TLC % pred: 86 %
TLC: 4.61 L

## 2019-12-22 LAB — NITRIC OXIDE: Nitric Oxide: 31

## 2019-12-22 NOTE — Progress Notes (Signed)
PFT done today. 

## 2019-12-26 ENCOUNTER — Ambulatory Visit (INDEPENDENT_AMBULATORY_CARE_PROVIDER_SITE_OTHER): Payer: Managed Care, Other (non HMO) | Admitting: Adult Health

## 2019-12-26 ENCOUNTER — Encounter: Payer: Self-pay | Admitting: Adult Health

## 2019-12-26 ENCOUNTER — Other Ambulatory Visit: Payer: Self-pay

## 2019-12-26 DIAGNOSIS — R0609 Other forms of dyspnea: Secondary | ICD-10-CM

## 2019-12-26 DIAGNOSIS — R0600 Dyspnea, unspecified: Secondary | ICD-10-CM

## 2019-12-26 DIAGNOSIS — J45990 Exercise induced bronchospasm: Secondary | ICD-10-CM

## 2019-12-26 NOTE — Patient Instructions (Signed)
Activity as tolerated. May use ProAir 1 to 2 puffs every 6 hours as needed for wheezing shortness of breath or prior to exercise Follow-up with Dr. Chase Caller in 4 to 6 months and as needed Please contact office for sooner follow up if symptoms do not improve or worsen or seek emergency care

## 2019-12-26 NOTE — Progress Notes (Signed)
@Patient  ID: Brittany Baldwin, female    DOB: 1971/01/10, 49 y.o.   MRN: [id number]  Chief Complaint  Patient presents with  . Follow-up    Dyspnea     Referring provider: Glean Hess, MD Virtual Visit via Telephone Note  I connected with Brittany Baldwin on 12/26/19 at 11:30 AM EDT by telephone and verified that I am speaking with the correct person using two identifiers.  Location: Patient: Home  Provider:  Office    I discussed the limitations, risks, security and privacy concerns of performing an evaluation and management service by telephone and the availability of in person appointments. I also discussed with the patient that there may be a patient responsible charge related to this service. The patient expressed understanding and agreed to proceed.   HPI: 49 year old female never smoker seen November 30, 2019 for pulmonary consult for shortness of breath and wheezing for 2 years.  Has been seen at [facility] for vocal cord nodule and dysphonia June 2020 Medical history significant for PE during childbirth  2000, chronic headaches previous evaluation at [place] Tachycardia seen by [facility] cardiology October 2020   TEST/EVENTS :  Walk test in the office November 30, 2019 showed no desaturations with O2 saturations at 100% on room air. 2D echo 2017 - for pulmonary hypertension VQ scan 2016 low probability for PE   12/26/2019 Follow up : Dyspnea , Wheezing  Patient returns for a 1 month follow-up.  Patient was seen last visit for a pulmonary consult for ongoing shortness of breath and wheezing with heavy exercise/or steps. Really started to happened when she returned to exercise.  Does not limit her activities . Able to run a mile .  Denies any chest pain palpitations or syncope. The symptoms have been going on for 2 years.  She was set up for Pulmonary function testing showed normal lung function with an FEV1 at 93%, ratio 81, FVC 93% no significant  bronchodilator response, mid flow obstruction and reversibility noted.  DLCO 100% Chest x-ray last visit showed clear lungs.  No acute process noted Allergy profile was negative.  IgE was 14, eosinophils were 100. Covid vaccine up-to-date.    Allergies  Allergen Reactions  . Codeine Shortness Of Breath  . Hydrocodone-Acetaminophen Rash  . Aspirin Other (See Comments)    Stomach issues  . Diphenhydramine Hcl Other (See Comments)    Through an I.V. Drip in addition to other meds. Caused hallucinations  . Adhesive [Tape] Rash    ELECTRODES FROM HOLTER MONITOR CAUSED RASH    Immunization History  Administered Date(s) Administered  . PFIZER SARS-COV-2 Vaccination 08/29/2019, 09/21/2019    Past Medical History:  Diagnosis Date  . Anemia    H/O   . Anxiety   . Dysrhythmia    SVT-HAD CARDIAC ABLATION DONE IN 2016 BUT PT STATES SHE STILL GETS INTERMITTENT TACHYCARDIC AND IS SYPMPTOMATIC WITH SOB, DIZZINESS DURING TACHY EPISODES (06-25-15)  . Fibroid   . GERD (gastroesophageal reflux disease)   . Headache    H/O  . Heart murmur    as child  . History of fundoplication 4/46/2863  . History of pulmonary embolus during pregnancy 05/16/2015   2001  . Hoarseness, persistent 10/06/2017  . Hyperlipemia   . Motion sickness    cars, boats  . PE (pulmonary thromboembolism) ([place]) 2000   while on bed rest during pregnancy  . PONV (postoperative nausea and vomiting)   . Shortness of breath dyspnea  ASSOCIATED WITH TACHYCARDIA  . Status post abdominal hysterectomy 07/02/2015   Status post TAH, bilateral salpingectomy. PATHOLOGY: Uterine fibroids and adenomyosis   . Tachycardia     Tobacco History: Social History   Tobacco Use  Smoking Status Never Smoker  Smokeless Tobacco Never Used   Counseling given: Not Answered   Outpatient Medications Prior to Visit  Medication Sig Dispense Refill  . atorvastatin (LIPITOR) 10 MG tablet TAKE 1 TABLET(10 MG) BY MOUTH DAILY AT 6 PM 90 tablet  3  . azelastine (ASTELIN) 0.1 % nasal spray Place 1 spray into both nostrils 2 (two) times daily.    . Biotin 1 MG CAPS Take by mouth.    . lubiprostone (AMITIZA) 24 MCG capsule Take 1 capsule (24 mcg total) by mouth 2 (two) times daily. 30 capsule 1  . Multiple Vitamins-Minerals (MULTIVITAMIN WITH MINERALS) tablet Take 1 tablet by mouth daily.    . nadolol (CORGARD) 40 MG tablet Take 40 mg by mouth 2 (two) times daily.    . propranolol (INDERAL) 20 MG tablet Take 1 tablet (20 mg total) by mouth daily as needed. 90 tablet 3   No facility-administered medications prior to visit.    ProBNP No results found for: PROBNP  Imaging: DG Chest 2 View  Result Date: 11/30/2019 CLINICAL DATA:  Dyspnea EXAM: CHEST - 2 VIEW COMPARISON:  2017 FINDINGS: The heart size and mediastinal contours are within normal limits. Both lungs are clear. No pleural effusion. The visualized skeletal structures are unremarkable. Cholecystectomy clips. IMPRESSION: No acute process in the chest. Electronically Signed   By: Macy Mis M.D.   On: 11/30/2019 15:41      PFT Results Latest Ref Rng & Units 12/22/2019  FVC-Pre L 3.00  FVC-Predicted Pre % 95  FVC-Post L 2.91  FVC-Predicted Post % 93  Pre FEV1/FVC % % 76  Post FEV1/FCV % % 81  FEV1-Pre L 2.28  FEV1-Predicted Pre % 90  FEV1-Post L 2.36  DLCO UNC% % 100  DLCO COR %Predicted % 117  TLC L 4.61  TLC % Predicted % 86  RV % Predicted % 81    Lab Results  Component Value Date   NITRICOXIDE 31 12/22/2019    A/P :  Dyspnea/Wheezing - exercised associated.  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  Discussed adding in albuterol.  Patient is hesitant due to her history of tachycardia.  But says that she will try this if she needs it.  She does feel that she is improving her exercise tolerance , that symptoms are not limiting her activity.  We did  discuss adding a low-dose ICS inhaler however she declined this at this time.  Plan  Patient Instructions  Activity as tolerated. May use ProAir 1 to 2 puffs every 6 hours as needed for wheezing shortness of breath or prior to exercise Follow-up with Dr. Chase Caller in 4 to 6 months and as needed Please contact office for sooner follow up if symptoms do not improve or worsen or seek emergency care          I discussed the assessment and treatment plan with the patient. The patient was provided an opportunity to ask questions and all were answered. The patient agreed with the plan and demonstrated an understanding of the instructions.   The patient was advised to call back or seek an in-person evaluation if the symptoms worsen or if the condition fails to improve as  anticipated.  I provided 22 minutes of non-face-to-face time during this encounter.   Rexene Edison, NP

## 2019-12-30 ENCOUNTER — Other Ambulatory Visit: Payer: Self-pay

## 2019-12-30 MED ORDER — LUBIPROSTONE 24 MCG PO CAPS: 24.0000 ug | ORAL_CAPSULE | Freq: Two times a day (BID) | ORAL | 1 refills | Status: DC

## 2019-12-30 NOTE — Telephone Encounter (Signed)
Patient called needing a refill on Amitiza. Sent medication to the pharmacy

## 2020-02-09 ENCOUNTER — Encounter: Payer: Self-pay | Admitting: Emergency Medicine

## 2020-02-09 ENCOUNTER — Other Ambulatory Visit: Payer: Self-pay

## 2020-02-09 ENCOUNTER — Ambulatory Visit: Payer: Self-pay | Admitting: Internal Medicine

## 2020-02-09 ENCOUNTER — Ambulatory Visit
Admission: EM | Admit: 2020-02-09 | Discharge: 2020-02-09 | Disposition: A | Discharge status: Home / Self Care | Payer: Managed Care, Other (non HMO) | Attending: Physician Assistant | Admitting: Physician Assistant

## 2020-02-09 ENCOUNTER — Ambulatory Visit (INDEPENDENT_AMBULATORY_CARE_PROVIDER_SITE_OTHER): Payer: Managed Care, Other (non HMO)

## 2020-02-09 DIAGNOSIS — R05 Cough: Secondary | ICD-10-CM

## 2020-02-09 DIAGNOSIS — Z20822 Contact with and (suspected) exposure to covid-19: Secondary | ICD-10-CM | POA: Diagnosis not present

## 2020-02-09 DIAGNOSIS — R079 Chest pain, unspecified: Secondary | ICD-10-CM | POA: Diagnosis not present

## 2020-02-09 DIAGNOSIS — K219 Gastro-esophageal reflux disease without esophagitis: Secondary | ICD-10-CM

## 2020-02-09 DIAGNOSIS — R0602 Shortness of breath: Secondary | ICD-10-CM | POA: Diagnosis not present

## 2020-02-09 DIAGNOSIS — B349 Viral infection, unspecified: Secondary | ICD-10-CM

## 2020-02-09 DIAGNOSIS — R059 Cough, unspecified: Secondary | ICD-10-CM

## 2020-02-09 MED ORDER — OMEPRAZOLE 20 MG PO CPDR: 20.0000 mg | DELAYED_RELEASE_CAPSULE | Freq: Every day | ORAL | 0 refills | Status: DC

## 2020-02-09 MED ORDER — PSEUDOEPH-BROMPHEN-DM 30-2-10 MG/5ML PO SYRP: 10.0000 mL | ORAL_SOLUTION | Freq: Four times a day (QID) | ORAL | 0 refills | Status: AC | PRN

## 2020-02-09 MED ORDER — FLUTICASONE PROPIONATE 50 MCG/ACT NA SUSP: 1.0000 | Freq: Two times a day (BID) | NASAL | 0 refills | Status: DC

## 2020-02-09 NOTE — Discharge Instructions (Addendum)
Your chest x-ray is normal today.  Your vital signs are normal and your chest is clear.  Covid test will result within 24 hours.  At this time go home and rest and increase fluids.  Use nasal spray and cough syrup as prescribed.  You can take the omeprazole for acid reflux.  Consider over-the-counter Gas-X for any gas pains.  Go to ER if breathing worsens.  URI/COLD SYMPTOMS: Your exam today is consistent with a viral illness. Antibiotics are not indicated at this time. Use medications as directed, including cough syrup, nasal saline, and decongestants. Your symptoms should improve over the next few days and resolve within 7-10 days. Increase rest and fluids. F/u if symptoms worsen or predominate such as sore throat, ear pain, productive cough, shortness of breath, or if you develop high fevers or worsening fatigue over the next several days.    You have received COVID testing today either for positive exposure, concerning symptoms that could be related to COVID infection, screening purposes, or re-testing after confirmed positive.  Your test obtained today checks for active viral infection in the last 1-2 weeks. If your test is negative now, you can still test positive later. So, if you do develop symptoms you should either get re-tested and/or isolate x 10 days. Please follow CDC guidelines.  While Rapid antigen tests come back in 15-20 minutes, send out PCR/molecular test results typically come back within 24 hours. In the mean time, if you are symptomatic, assume this could be a positive test and treat/monitor yourself as if you do have COVID.   We will call with test results. Please download the MyChart app and set up a profile to access test results.   If symptomatic, go home and rest. Push fluids. Take Tylenol as needed for discomfort. Gargle warm salt water. Throat lozenges. Take Mucinex DM or Robitussin for cough. Humidifier in bedroom to ease coughing. Warm showers. Also review the COVID handout  for more information.  COVID-19 INFECTION: The incubation period of COVID-19 is approximately 14 days after exposure, with most symptoms developing in roughly 4-5 days. Symptoms may range in severity from mild to critically severe. Roughly 80% of those infected will have mild symptoms. People of any age may become infected with COVID-19 and have the ability to transmit the virus. The most common symptoms include: fever, fatigue, cough, body aches, headaches, sore throat, nasal congestion, shortness of breath, nausea, vomiting, diarrhea, changes in smell and/or taste.    COURSE OF ILLNESS Some patients may begin with mild disease which can progress quickly into critical symptoms. If your symptoms are worsening please call ahead to the Emergency Department and proceed there for further treatment. Recovery time appears to be roughly 1-2 weeks for mild symptoms and 3-6 weeks for severe disease.   GO IMMEDIATELY TO ER FOR FEVER YOU ARE UNABLE TO GET DOWN WITH TYLENOL, BREATHING PROBLEMS, CHEST PAIN, FATIGUE, LETHARGY, INABILITY TO EAT OR DRINK, ETC  QUARANTINE AND ISOLATION: To help decrease the spread of COVID-19 please remain isolated if you have COVID infection or are highly suspected to have COVID infection. This means -stay home and isolate to one room in the home if you live with others. Do not share a bed or bathroom with others while ill, sanitize and wipe down all countertops and keep common areas clean and disinfected. You may discontinue isolation if you have a mild case and are asymptomatic 10 days after symptom onset as long as you have been fever free >24 hours  without having to take Motrin or Tylenol. If your case is more severe (meaning you develop pneumonia or are admitted in the hospital), you may have to isolate longer.   If you have been in close contact (within 6 feet) of someone diagnosed with COVID 19, you are advised to quarantine in your home for 14 days as symptoms can develop anywhere  from 2-14 days after exposure to the virus. If you develop symptoms, you  must isolate.  Most current guidelines for COVID after exposure -isolate 10 days if you ARE NOT tested for COVID as long as symptoms do not develop -isolate 7 days if you are tested and remain asymptomatic -You do not necessarily need to be tested for COVID if you have + exposure and        develop   symptoms. Just isolate at home x10 days from symptom onset During this global pandemic, CDC advises to practice social distancing, try to stay at least 41ft away from others at all times. Wear a face covering. Wash and sanitize your hands regularly and avoid going anywhere that is not necessary.  KEEP IN MIND THAT THE COVID TEST IS NOT 100% ACCURATE AND YOU SHOULD STILL DO EVERYTHING TO PREVENT POTENTIAL SPREAD OF VIRUS TO OTHERS (WEAR MASK, WEAR GLOVES, [place] HANDS AND SANITIZE REGULARLY). IF INITIAL TEST IS NEGATIVE, THIS MAY NOT MEAN YOU ARE DEFINITELY NEGATIVE. MOST ACCURATE TESTING IS DONE 5-7 DAYS AFTER EXPOSURE.   It is not advised by CDC to get re-tested after receiving a positive COVID test since you can still test positive for weeks to months after you have already cleared the virus.   *If you have not been vaccinated for COVID, I strongly suggest you consider getting vaccinated as long as there are no contraindications.

## 2020-02-09 NOTE — ED Provider Notes (Signed)
I have also sent [facility]    CSN: [id number] Arrival date & time: 02/09/20  1622      History   Chief Complaint Chief Complaint  Patient presents with  . Shortness of Breath    HPI Brittany Baldwin is a 49 y.o. female.   49 year old female presents for feeling short of breath since yesterday.  She also admits to postnasal drainage, nasal congestion, cough, sore throat, and chest pressure when coughing.  Patient has been exposed to Covid several days before symptom onset.  She says she took a rapid [facility] Covid test that was negative but that was about 3 days ago.  Patient denies fever, weakness, sinus pain, ear pain, abdominal pain, nausea, vomiting, diarrhea, swelling taste changes.  She is fully vaccinated for Covid.  Patient has a medical history of anxiety, GERD, anemia, and PE during pregnancy.  Patient reports that her symptoms feel similar to when she has had bronchitis or pneumonia in the past.  No other concerns today.     Past Medical History:  Diagnosis Date  . Anemia    H/O   . Anxiety   . Dysrhythmia    SVT-HAD CARDIAC ABLATION DONE IN 2016 BUT PT STATES SHE STILL GETS INTERMITTENT TACHYCARDIC AND IS SYPMPTOMATIC WITH SOB, DIZZINESS DURING TACHY EPISODES (06-25-15)  . Fibroid   . GERD (gastroesophageal reflux disease)   . Headache    H/O  . Heart murmur    as child  . History of fundoplication 3/61/4431  . History of pulmonary embolus during pregnancy 05/16/2015   2001  . Hoarseness, persistent 10/06/2017  . Hyperlipemia   . Motion sickness    cars, boats  . PE (pulmonary thromboembolism) ([place]) 2000   while on bed rest during pregnancy  . PONV (postoperative nausea and vomiting)   . Shortness of breath dyspnea    ASSOCIATED WITH TACHYCARDIA  . Status post abdominal hysterectomy 07/02/2015   Status post TAH, bilateral salpingectomy. PATHOLOGY: Uterine fibroids and adenomyosis   . Tachycardia     Patient Active Problem List   Diagnosis Date Noted    . Atypical chest pain   . Acanthosis nigricans 07/30/2018  . Esophageal dysphagia   . Functional dyspepsia 02/20/2017  . Hypercalcemia 01/18/2017  . Muscle spasm of right shoulder 08/05/2016  . Dependent edema 08/05/2016  . Plantar fasciitis, bilateral 08/05/2016  . 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 Elevated parathyroid hormone 07/03/2016  . 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 Tachycardia 05/10/2015  . Hyperlipidemia, mixed 03/28/2014    Past Surgical History:  Procedure Laterality Date  . ABDOMINAL HYSTERECTOMY Bilateral 07/02/2015   Procedure: HYSTERECTOMY ABDOMINAL WITH BS;  Surgeon: Brayton Mars, MD;  Location: [facility];  Service: Gynecology;  Laterality: Bilateral;  . APPENDECTOMY    . CARDIAC ELECTROPHYSIOLOGY STUDY AND ABLATION    . CHOLECYSTECTOMY    . COLONOSCOPY  2013  . COLONOSCOPY WITH PROPOFOL N/A 03/10/2017   Procedure: COLONOSCOPY WITH PROPOFOL;  Surgeon: Lin Landsman, MD;  Location: [facility];  Service: Endoscopy;  Laterality: N/A;  . CYSTOSCOPY Bilateral 07/02/2015   Procedure: CYSTOSCOPY;  Surgeon: Brayton Mars, MD;  Location: [facility];  Service: Gynecology;  Laterality: Bilateral;  . ESOPHAGEAL DILATION  03/10/2017   Procedure: ESOPHAGEAL DILATION;  Surgeon: Lin Landsman, MD;  Location: [place];  Service: Endoscopy;;  . ESOPHAGOGASTRODUODENOSCOPY N/A 03/10/2017   Procedure: ESOPHAGOGASTRODUODENOSCOPY (EGD);  Surgeon: Lin Landsman, MD;  Location: [place];  Service: Endoscopy;  Laterality: N/A;  . ESOPHAGOGASTRODUODENOSCOPY (EGD) WITH PROPOFOL N/A 07/01/2019   Procedure: ESOPHAGOGASTRODUODENOSCOPY (EGD) WITH PROPOFOL;  Surgeon: Lin Landsman, MD;  Location: [place];  Service: Endoscopy;  Laterality: N/A;  . LAPAROTOMY N/A 07/11/2015   Procedure: hematoma evacuation;  Surgeon: Brayton Mars, MD;  Location: [facility];  Service: Gynecology;  Laterality: N/A;  .  NISSEN FUNDOPLICATION    . TUBAL LIGATION      OB History    Gravida  8   Para  3   Term  1   Preterm  2   AB  5   Living  1     SAB  4   TAB  1   Ectopic      Multiple      Live Births  1            Home Medications    Prior to Admission medications   Medication Sig Start Date End Date Taking? Authorizing Provider  atorvastatin (LIPITOR) 10 MG tablet TAKE 1 TABLET(10 MG) BY MOUTH DAILY AT 6 PM 07/18/19  Yes Glean Hess, MD  Biotin 1 MG CAPS Take by mouth.   Yes [provider]  lubiprostone (AMITIZA) 24 MCG capsule Take 1 capsule (24 mcg total) by mouth 2 (two) times daily. 12/30/19  Yes Vanga, Tally Due, MD  Multiple Vitamins-Minerals (MULTIVITAMIN WITH MINERALS) tablet Take 1 tablet by mouth daily.   Yes [provider]  nadolol (CORGARD) 40 MG tablet Take 40 mg by mouth 2 (two) times daily.   Yes [provider]  propranolol (INDERAL) 20 MG tablet Take 1 tablet (20 mg total) by mouth daily as needed. 06/23/18  Yes Glean Hess, MD  azelastine (ASTELIN) 0.1 % nasal spray Place 1 spray into both nostrils 2 (two) times daily. 04/15/19   [provider]  brompheniramine-pseudoephedrine-DM 30-2-10 MG/5ML syrup Take 10 mLs by mouth 4 (four) times daily as needed for up to 7 days. 02/09/20 02/16/20  Laurene Footman B, PA-C  fluticasone (FLONASE) 50 MCG/ACT nasal spray Place 1 spray into both nostrils in the morning and at bedtime for 7 days. 02/09/20 02/16/20  Danton Clap, PA-C  omeprazole (PRILOSEC) 20 MG capsule Take 1 capsule (20 mg total) by mouth daily. 02/09/20 03/10/20  Danton Clap, PA-C    Family History Family History  Problem Relation Age of Onset  . Diabetes Mother   . Prostate cancer Father   . Congestive Heart Failure Father   . Heart failure Father   . Cancer Neg Hx     Social History Social History   Tobacco Use  . Smoking status: Never Smoker  . Smokeless tobacco: Never Used  Vaping Use  .  Vaping Use: Never used  Substance Use Topics  . Alcohol use: No  . Drug use: No     Allergies   Codeine, Hydrocodone-acetaminophen, Aspirin, Diphenhydramine hcl, and Adhesive [tape]   Review of Systems Review of Systems  Constitutional: Positive for fatigue. Negative for chills, diaphoresis and fever.  HENT: Positive for congestion, postnasal drip, rhinorrhea and sore throat. Negative for ear pain, sinus pressure and sinus pain.   Respiratory: Positive for shortness of breath. Negative for cough and wheezing.   Cardiovascular: Positive for chest pain ("chest pressure when coughing").  Gastrointestinal: Negative for abdominal pain, diarrhea, nausea and vomiting.  Musculoskeletal: Negative for arthralgias and myalgias.  Skin: Negative for rash.  Neurological: Negative for weakness and headaches.  Hematological:  Negative for adenopathy.     Physical Exam Triage Vital Signs ED Triage Vitals  Enc Vitals Group     BP 02/09/20 1651 133/83     Pulse Rate 02/09/20 1651 79     Resp 02/09/20 1651 18     Temp 02/09/20 1651 97.8 F (36.6 C)     Temp Source 02/09/20 1651 Oral     SpO2 02/09/20 1651 100 %     Weight 02/09/20 1649 197 lb 12 oz (89.7 kg)     Height 02/09/20 1649 5\' 6"  (1.676 m)     Head Circumference --      Peak Flow --      Pain Score 02/09/20 1648 3     Pain Loc --      Pain Edu? --      Excl. in [place]? --    No data found.  Updated Vital Signs BP 133/83 (BP Location: Right Arm)   Pulse 79   Temp 97.8 F (36.6 C) (Oral)   Resp 18   Ht 5\' 6"  (1.676 m)   Wt 197 lb 12 oz (89.7 kg)   LMP 06/26/2015 (Exact Date)   SpO2 100%   BMI 31.92 kg/m        Physical Exam Vitals and nursing note reviewed.  Constitutional:      General: She is not in acute distress.    Appearance: Normal appearance. She is not ill-appearing or toxic-appearing.  HENT:     Head: Normocephalic and atraumatic.     Nose: Congestion and rhinorrhea present.     Mouth/Throat:     Mouth:  Mucous membranes are moist.     Pharynx: Oropharynx is clear. Posterior oropharyngeal erythema present.     Comments: Has clear PND Eyes:     General: No scleral icterus.       Right eye: No discharge.        Left eye: No discharge.     Conjunctiva/sclera: Conjunctivae normal.  Cardiovascular:     Rate and Rhythm: Normal rate and regular rhythm.     Heart sounds: Normal heart sounds. No murmur heard.   Pulmonary:     Effort: Pulmonary effort is normal. No respiratory distress.     Breath sounds: Normal breath sounds. No wheezing, rhonchi or rales.  Musculoskeletal:     Cervical back: Neck supple.  Skin:    General: Skin is dry.  Neurological:     General: No focal deficit present.     Mental Status: She is alert. Mental status is at baseline.     Motor: No weakness.     Gait: Gait normal.  Psychiatric:        Mood and Affect: Mood normal.        Behavior: Behavior normal.        Thought Content: Thought content normal.      UC Treatments / Results  Labs (all labs ordered are listed, but only abnormal results are displayed) Labs Reviewed  SARS CORONAVIRUS 2 (TAT 6-24 HRS)    EKG   Radiology DG Chest 2 View  Result Date: 02/09/2020 CLINICAL DATA:  49 year old female with shortness of breath, cough, chest tightness. EXAM: CHEST - 2 VIEW COMPARISON:  11/30/2019 and earlier. FINDINGS: Lung volumes and mediastinal contours are within normal limits. Visualized tracheal air column is within normal limits. Both lungs appear stable and clear. Stable cholecystectomy clips. Negative visible bowel gas pattern. No acute osseous abnormality identified. IMPRESSION: Negative.  No cardiopulmonary abnormality.  Electronically Signed   By: Genevie Ann M.D.   On: 02/09/2020 18:23    Procedures Procedures (including critical care time)  Medications Ordered in UC Medications - No data to display  Initial Impression / Assessment and Plan / UC Course  I have reviewed the triage vital signs  and the nursing notes.  Pertinent labs & imaging results that were available during my care of the patient were reviewed by me and considered in my medical decision making (see chart for details).   Chest x-ray is within normal limits today.  All vital signs are stable and normal.  Chest is clear to auscultation.  Patient does appear somewhat anxious since she has been exposed to Covid.  Covid testing performed today.  Discussed monitoring how to access results.  CDC guidelines on isolation protocol also discussed with patient if Covid positive.  When I went to discuss the results of her chest x-ray with her, patient said that she "belched" and that seemed to relieve the chest discomfort.  She does have a history of acid reflux and asked for medication to help.  I have sent Prilosec and advised her to take over-the-counter Gas-X.  Advised her to rest increase fluid intake.  The cough could be due to reflux, or she could have a viral infection.  I have also sent Bromfed and fluticasone to pharmacy for her postnasal drainage and nasal congestion.  Advised patient to rest increase fluid intake.  If she has any increase shortness of breath or pain in her chest or any new or worsening symptoms, she should proceed to the emergency department.  Patient understanding and agreeable.  Final Clinical Impressions(s) / UC Diagnoses   Final diagnoses:  Viral illness  SOB (shortness of breath)  Exposure to COVID-19 virus  Cough  Gastroesophageal reflux disease without esophagitis     Discharge Instructions     Your chest x-ray is normal today.  Your vital signs are normal and your chest is clear.  Covid test will result within 24 hours.  At this time go home and rest and increase fluids.  Use nasal spray and cough syrup as prescribed.  You can take the omeprazole for acid reflux.  Consider over-the-counter Gas-X for any gas pains.  Go to ER if breathing worsens.  URI/COLD SYMPTOMS: Your exam today is  consistent with a viral illness. Antibiotics are not indicated at this time. Use medications as directed, including cough syrup, nasal saline, and decongestants. Your symptoms should improve over the next few days and resolve within 7-10 days. Increase rest and fluids. F/u if symptoms worsen or predominate such as sore throat, ear pain, productive cough, shortness of breath, or if you develop high fevers or worsening fatigue over the next several days.    You have received COVID testing today either for positive exposure, concerning symptoms that could be related to COVID infection, screening purposes, or re-testing after confirmed positive.  Your test obtained today checks for active viral infection in the last 1-2 weeks. If your test is negative now, you can still test positive later. So, if you do develop symptoms you should either get re-tested and/or isolate x 10 days. Please follow CDC guidelines.  While Rapid antigen tests come back in 15-20 minutes, send out PCR/molecular test results typically come back within 24 hours. In the mean time, if you are symptomatic, assume this could be a positive test and treat/monitor yourself as if you do have COVID.   We will call with  test results. Please download the MyChart app and set up a profile to access test results.   If symptomatic, go home and rest. Push fluids. Take Tylenol as needed for discomfort. Gargle warm salt water. Throat lozenges. Take Mucinex DM or Robitussin for cough. Humidifier in bedroom to ease coughing. Warm showers. Also review the COVID handout for more information.  COVID-19 INFECTION: The incubation period of COVID-19 is approximately 14 days after exposure, with most symptoms developing in roughly 4-5 days. Symptoms may range in severity from mild to critically severe. Roughly 80% of those infected will have mild symptoms. People of any age may become infected with COVID-19 and have the ability to transmit the virus. The most  common symptoms include: fever, fatigue, cough, body aches, headaches, sore throat, nasal congestion, shortness of breath, nausea, vomiting, diarrhea, changes in smell and/or taste.    COURSE OF ILLNESS Some patients may begin with mild disease which can progress quickly into critical symptoms. If your symptoms are worsening please call ahead to the Emergency Department and proceed there for further treatment. Recovery time appears to be roughly 1-2 weeks for mild symptoms and 3-6 weeks for severe disease.   GO IMMEDIATELY TO ER FOR FEVER YOU ARE UNABLE TO GET DOWN WITH TYLENOL, BREATHING PROBLEMS, CHEST PAIN, FATIGUE, LETHARGY, INABILITY TO EAT OR DRINK, ETC  QUARANTINE AND ISOLATION: To help decrease the spread of COVID-19 please remain isolated if you have COVID infection or are highly suspected to have COVID infection. This means -stay home and isolate to one room in the home if you live with others. Do not share a bed or bathroom with others while ill, sanitize and wipe down all countertops and keep common areas clean and disinfected. You may discontinue isolation if you have a mild case and are asymptomatic 10 days after symptom onset as long as you have been fever free >24 hours without having to take Motrin or Tylenol. If your case is more severe (meaning you develop pneumonia or are admitted in the hospital), you may have to isolate longer.   If you have been in close contact (within 6 feet) of someone diagnosed with COVID 19, you are advised to quarantine in your home for 14 days as symptoms can develop anywhere from 2-14 days after exposure to the virus. If you develop symptoms, you  must isolate.  Most current guidelines for COVID after exposure -isolate 10 days if you ARE NOT tested for COVID as long as symptoms do not develop -isolate 7 days if you are tested and remain asymptomatic -You do not necessarily need to be tested for COVID if you have + exposure and        develop   symptoms.  Just isolate at home x10 days from symptom onset During this global pandemic, CDC advises to practice social distancing, try to stay at least 86ft away from others at all times. Wear a face covering. Wash and sanitize your hands regularly and avoid going anywhere that is not necessary.  KEEP IN MIND THAT THE COVID TEST IS NOT 100% ACCURATE AND YOU SHOULD STILL DO EVERYTHING TO PREVENT POTENTIAL SPREAD OF VIRUS TO OTHERS (WEAR MASK, WEAR GLOVES, [place] HANDS AND SANITIZE REGULARLY). IF INITIAL TEST IS NEGATIVE, THIS MAY NOT MEAN YOU ARE DEFINITELY NEGATIVE. MOST ACCURATE TESTING IS DONE 5-7 DAYS AFTER EXPOSURE.   It is not advised by CDC to get re-tested after receiving a positive COVID test since you can still test positive for weeks to months  after you have already cleared the virus.   *If you have not been vaccinated for COVID, I strongly suggest you consider getting vaccinated as long as there are no contraindications.      ED Prescriptions    Medication Sig Dispense Auth. Provider   fluticasone (FLONASE) 50 MCG/ACT nasal spray Place 1 spray into both nostrils in the morning and at bedtime for 7 days. 1 g Laurene Footman B, PA-C   brompheniramine-pseudoephedrine-DM 30-2-10 MG/5ML syrup Take 10 mLs by mouth 4 (four) times daily as needed for up to 7 days. 200 mL Laurene Footman B, PA-C   omeprazole (PRILOSEC) 20 MG capsule Take 1 capsule (20 mg total) by mouth daily. 30 capsule Danton Clap, PA-C     PDMP not reviewed this encounter.   Danton Clap, PA-C 02/10/20 1246

## 2020-02-09 NOTE — Telephone Encounter (Signed)
Pt reports "Post nasal drip" x 2 days. States mild cough "I coughed 2 times today but I feel thick mucous in back of my throat." States "Very mild" chest tightness, mid chest "Like when you have a cold." Denies SOB. afebrile.States is coughing up "Small amount of thick white mucous."  Aware with covid screening could not be seen in office. Pt questioning if medication could be called in or virtual appt needed.  Please advise: [phone]  Reason for Disposition  [1] Nasal discharge AND [2] present > 10 days    "Lots of post nasal drip" per patient  Answer Assessment - Initial Assessment Questions 1. ONSET: "When did the cough begin?"      2 days ago 2. SEVERITY: "How bad is the cough today?"      mild 3. SPUTUM: "Describe the color of your sputum" (none, dry cough; clear, white, yellow, green)     whitish 4. HEMOPTYSIS: "Are you coughing up any blood?" If so ask: "How much?" (flecks, streaks, tablespoons, etc.)     no 5. DIFFICULTY BREATHING: "Are you having difficulty breathing?" If Yes, ask: "How bad is it?" (e.g., mild, moderate, severe)    - MILD: No SOB at rest, mild SOB with walking, speaks normally in sentences, can lay down, no retractions, pulse < 100.    - MODERATE: SOB at rest, SOB with minimal exertion and prefers to sit, cannot lie down flat, speaks in phrases, mild retractions, audible wheezing, pulse 100-120.    - SEVERE: Very SOB at rest, speaks in single words, struggling to breathe, sitting hunched forward, retractions, pulse > 120      mild 6. FEVER: "Do you have a fever?" If Yes, ask: "What is your temperature, how was it measured, and when did it start?"     no 7. CARDIAC HISTORY: "Do you have any history of heart disease?" (e.g., heart attack, congestive heart failure)     no 8. LUNG HISTORY: "Do you have any history of lung disease?"  (e.g., pulmonary embolus, asthma, emphysema)     no 9. PE RISK FACTORS: "Do you have a history of blood clots?" (or: recent major  surgery, recent prolonged travel, bedridden)     no 10. OTHER SYMPTOMS: "Do you have any other symptoms?" (e.g., runny nose, wheezing, chest pain)      Chest feels tight, mid center "Very mild."  Protocols used: COUGH - ACUTE PRODUCTIVE-A-AH

## 2020-02-09 NOTE — Telephone Encounter (Signed)
Called pt informed her that Dr. Army Melia is completely booked for tomorrow 02/10/2020 and she will also be out of office 9/13-9/17. Told pt to go to UC. Pt verbalized understanding and stated that she would go there.  KP

## 2020-02-09 NOTE — ED Triage Notes (Signed)
Pt c/o post nasal drip, intermittent shortness of breath, cough, and chest tightness. Started yesterday. She states she took an at home covid test and was negative. She has has covid vaccine.

## 2020-02-10 LAB — SARS CORONAVIRUS 2 (TAT 6-24 HRS): SARS Coronavirus 2: NEGATIVE

## 2020-03-07 ENCOUNTER — Other Ambulatory Visit: Payer: Self-pay

## 2020-03-07 MED ORDER — LUBIPROSTONE 24 MCG PO CAPS: 24.0000 ug | ORAL_CAPSULE | Freq: Two times a day (BID) | ORAL | 1 refills | Status: DC

## 2020-03-07 NOTE — Telephone Encounter (Signed)
Last office visit 08/29/2019 constipation Last refill 12/30/2019 1 refills Follow up as needed  Patient states she wants more then one refill

## 2020-04-17 ENCOUNTER — Ambulatory Visit
Admission: RE | Admit: 2020-04-17 | Discharge: 2020-04-17 | Disposition: A | Discharge status: Home / Self Care | Payer: Managed Care, Other (non HMO) | Attending: Internal Medicine | Admitting: Internal Medicine

## 2020-04-17 ENCOUNTER — Other Ambulatory Visit: Payer: Self-pay

## 2020-04-17 ENCOUNTER — Ambulatory Visit (INDEPENDENT_AMBULATORY_CARE_PROVIDER_SITE_OTHER): Payer: Managed Care, Other (non HMO) | Admitting: Internal Medicine

## 2020-04-17 ENCOUNTER — Encounter: Payer: Self-pay | Admitting: Internal Medicine

## 2020-04-17 ENCOUNTER — Ambulatory Visit
Admission: RE | Admit: 2020-04-17 | Discharge: 2020-04-17 | Disposition: A | Discharge status: Home / Self Care | Payer: Managed Care, Other (non HMO) | Source: Ambulatory Visit | Attending: Internal Medicine | Admitting: Internal Medicine

## 2020-04-17 ENCOUNTER — Telehealth: Payer: Self-pay

## 2020-04-17 VITALS — BP 118/64 | HR 72 | Temp 97.7°F | Ht 66.0 in | Wt 194.0 lb

## 2020-04-17 DIAGNOSIS — M25552 Pain in left hip: Secondary | ICD-10-CM | POA: Diagnosis present

## 2020-04-17 DIAGNOSIS — J0100 Acute maxillary sinusitis, unspecified: Secondary | ICD-10-CM

## 2020-04-17 MED ORDER — AZITHROMYCIN 250 MG PO TABS: ORAL_TABLET | ORAL | 0 refills | Status: AC

## 2020-04-17 MED ORDER — MELOXICAM 15 MG PO TABS: 15.0000 mg | ORAL_TABLET | Freq: Every day | ORAL | 0 refills | Status: DC

## 2020-04-17 NOTE — Telephone Encounter (Signed)
Copied from [place] [id number]. Topic: Quick Communication - See Telephone Encounter >> Apr 17, 2020 11:30 AM [place] wrote: CRM for notification. See Telephone encounter for: 04/17/20. Pt needs a FU. Has a terrible sore throat with white spots has had a couple days. Thinks Strep. Told pt that office would call back and confirm in office visit as this is her request FU possible today for visit. # [phone]

## 2020-04-17 NOTE — Progress Notes (Signed)
Date:  04/17/2020   Name:  Brittany Baldwin   DOB:  Nov 05, 1970   MRN:  [id number]   Chief Complaint: Sore Throat (Sore throat with white spots in back of throat per patient x 2 days. No fever or cough.) and Hip Pain (Left hip pain- almost 2 weeks when walking and turning a certain way. Has been told her hips are not aligned. )  Sore Throat  This is a new problem. The current episode started in the past 7 days. The problem has been unchanged. Neither side of throat is experiencing more pain than the other. There has been no fever. The pain is moderate. Associated symptoms include ear pain. Pertinent negatives include no coughing, headaches or shortness of breath. She has tried nothing for the symptoms.  Hip Pain  There was no injury mechanism. The pain is present in the left leg. The quality of the pain is described as shooting. The pain is moderate. The pain has been fluctuating since onset. Pertinent negatives include no inability to bear weight, loss of motion, muscle weakness or numbness. Exacerbated by: certain movements.    Lab Results  Component Value Date   CREATININE 0.96 06/29/2019   BUN 9 06/29/2019   NA 138 06/29/2019   K 4.3 06/29/2019   CL 104 06/29/2019   CO2 21 06/29/2019   Lab Results  Component Value Date   CHOL 181 06/29/2019   HDL 50 06/29/2019   LDLCALC 116 (H) 06/29/2019   TRIG 78 06/29/2019   CHOLHDL 3.6 06/29/2019   Lab Results  Component Value Date   TSH 2.810 06/29/2019   No results found for: HGBA1C Lab Results  Component Value Date   WBC 4.7 11/30/2019   HGB 13.7 11/30/2019   HCT 41.3 11/30/2019   MCV 90.2 11/30/2019   PLT 205.0 11/30/2019   Lab Results  Component Value Date   ALT 21 06/29/2019   AST 17 06/29/2019   ALKPHOS 46 06/29/2019   BILITOT 0.6 06/29/2019     Review of Systems  Constitutional: Negative for chills, fatigue and fever.  HENT: Positive for ear pain, sinus pressure and sore throat.   Respiratory: Negative for cough,  chest tightness and shortness of breath.   Cardiovascular: Negative for chest pain.  Musculoskeletal: Positive for arthralgias. Negative for joint swelling and myalgias.  Neurological: Negative for dizziness, numbness and headaches.    Patient Active Problem List   Diagnosis Date Noted  . Atypical chest pain   . Acanthosis nigricans 07/30/2018  . Esophageal dysphagia   . Functional dyspepsia 02/20/2017  . Hypercalcemia 01/18/2017  . Muscle spasm of right shoulder 08/05/2016  . Dependent edema 08/05/2016  . Plantar fasciitis, bilateral 08/05/2016  . 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 Elevated parathyroid hormone 07/03/2016  . 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 Tachycardia 05/10/2015  . Hyperlipidemia, mixed 03/28/2014    Allergies  Allergen Reactions  . Codeine Shortness Of Breath  . Hydrocodone-Acetaminophen Rash  . Aspirin Other (See Comments)    Stomach issues  . Diphenhydramine Hcl Other (See Comments)    Through an I.V. Drip in addition to other meds. Caused hallucinations  . Adhesive [Tape] Rash    ELECTRODES FROM HOLTER MONITOR CAUSED RASH    Past Surgical History:  Procedure Laterality Date  . ABDOMINAL HYSTERECTOMY Bilateral 07/02/2015   Procedure: HYSTERECTOMY ABDOMINAL WITH BS;  Surgeon: Brayton Mars, MD;  Location: [facility];  Service: Gynecology;  Laterality: Bilateral;  . APPENDECTOMY    .  CARDIAC ELECTROPHYSIOLOGY STUDY AND ABLATION    . CHOLECYSTECTOMY    . COLONOSCOPY  2013  . COLONOSCOPY WITH PROPOFOL N/A 03/10/2017   Procedure: COLONOSCOPY WITH PROPOFOL;  Surgeon: Lin Landsman, MD;  Location: [place];  Service: Endoscopy;  Laterality: N/A;  . CYSTOSCOPY Bilateral 07/02/2015   Procedure: CYSTOSCOPY;  Surgeon: Brayton Mars, MD;  Location: [facility];  Service: Gynecology;  Laterality: Bilateral;  . ESOPHAGEAL DILATION  03/10/2017   Procedure: ESOPHAGEAL DILATION;  Surgeon: Lin Landsman, MD;  Location:  [place];  Service: Endoscopy;;  . ESOPHAGOGASTRODUODENOSCOPY N/A 03/10/2017   Procedure: ESOPHAGOGASTRODUODENOSCOPY (EGD);  Surgeon: Lin Landsman, MD;  Location: [place];  Service: Endoscopy;  Laterality: N/A;  . ESOPHAGOGASTRODUODENOSCOPY (EGD) WITH PROPOFOL N/A 07/01/2019   Procedure: ESOPHAGOGASTRODUODENOSCOPY (EGD) WITH PROPOFOL;  Surgeon: Lin Landsman, MD;  Location: [place];  Service: Endoscopy;  Laterality: N/A;  . LAPAROTOMY N/A 07/11/2015   Procedure: hematoma evacuation;  Surgeon: Brayton Mars, MD;  Location: [facility];  Service: Gynecology;  Laterality: N/A;  . NISSEN FUNDOPLICATION    . TUBAL LIGATION      Social History   Tobacco Use  . Smoking status: Never Smoker  . Smokeless tobacco: Never Used  Vaping Use  . Vaping Use: Never used  Substance Use Topics  . Alcohol use: No  . Drug use: No     Medication list has been reviewed and updated.  Current Meds  Medication Sig  . atorvastatin (LIPITOR) 10 MG tablet TAKE 1 TABLET(10 MG) BY MOUTH DAILY AT 6 PM  . Biotin 1 MG CAPS Take by mouth.  . fluticasone (FLONASE) 50 MCG/ACT nasal spray Place 1 spray into both nostrils in the morning and at bedtime for 7 days.  Marland Kitchen lubiprostone (AMITIZA) 24 MCG capsule Take 1 capsule (24 mcg total) by mouth 2 (two) times daily.  . Multiple Vitamins-Minerals (MULTIVITAMIN WITH MINERALS) tablet Take 1 tablet by mouth daily.  . nadolol (CORGARD) 40 MG tablet Take 40 mg by mouth 2 (two) times daily.  Marland Kitchen omeprazole (PRILOSEC) 20 MG capsule Take 1 capsule (20 mg total) by mouth daily. (Patient taking differently: Take 20 mg by mouth as needed. )  . propranolol (INDERAL) 20 MG tablet Take 1 tablet (20 mg total) by mouth daily as needed.    PHQ 2/9 Scores 04/17/2020 06/29/2019 02/28/2019 07/30/2018  PHQ - 2 Score 0 0 0 0  PHQ- 9 Score 0 - - -    GAD 7 : Generalized Anxiety Score 04/17/2020  Nervous, Anxious, on Edge 0  Control/stop worrying  0  Worry too much - different things 0  Trouble relaxing 0  Restless 0  Easily annoyed or irritable 0  Afraid - awful might happen 0  Total GAD 7 Score 0  Anxiety Difficulty Not difficult at all    BP Readings from Last 3 Encounters:  04/17/20 118/64  02/09/20 133/83  11/30/19 118/78    Physical Exam Vitals and nursing note reviewed.  Constitutional:      General: She is not in acute distress.    Appearance: She is well-developed.  HENT:     Head: Normocephalic and atraumatic.     Right Ear: No middle ear effusion. Tympanic membrane is erythematous.     Left Ear:  No middle ear effusion. Tympanic membrane is erythematous.     Mouth/Throat:     Mouth: Mucous membranes are moist. No oral lesions.     Pharynx: Posterior oropharyngeal  erythema present. No oropharyngeal exudate.     Tonsils: Tonsillar exudate (left tonsilith) present.  Cardiovascular:     Rate and Rhythm: Normal rate and regular rhythm.     Heart sounds: Normal heart sounds.  Pulmonary:     Effort: Pulmonary effort is normal. No respiratory distress.  Musculoskeletal:        General: Normal range of motion.     Cervical back: Normal range of motion.     Right hip: Normal.     Left hip: No tenderness or bony tenderness. Normal range of motion.     Comments: Sharp anterior hip pain with hip extension and external rotation under stress  Lymphadenopathy:     Cervical: No cervical adenopathy.  Skin:    General: Skin is warm and dry.     Capillary Refill: Capillary refill takes less than 2 seconds.     Findings: No rash.  Neurological:     Mental Status: She is alert and oriented to person, place, and time.  Psychiatric:        Behavior: Behavior normal.        Thought Content: Thought content normal.     Wt Readings from Last 3 Encounters:  04/17/20 194 lb (88 kg)  02/09/20 197 lb 12 oz (89.7 kg)  11/30/19 197 lb 12.8 oz (89.7 kg)    BP 118/64   Pulse 72   Temp 97.7 F (36.5 C) (Oral)   Ht 5\' 6"   (1.676 m)   Wt 194 lb (88 kg)   LMP 06/26/2015 (Exact Date)   SpO2 99%   BMI 31.31 kg/m   Assessment and Plan: 1. Acute non-recurrent maxillary sinusitis Recommend Claritin for drainage Flonase as needed for congestion Sore throat from drainage; white lesion is a tonsiltih - azithromycin (ZITHROMAX Z-PAK) 250 MG tablet; UAD  Dispense: 6 each; Refill: 0  2. Left hip pain Avoid left hip extension and rotation Begin Mobic daily - consider PT if no improvement - meloxicam (MOBIC) 15 MG tablet; Take 1 tablet (15 mg total) by mouth daily.  Dispense: 30 tablet; Refill: 0 - DG Hip Unilat W OR W/O Pelvis 2-3 Views Left; Future   Partially dictated using Editor, commissioning. Any errors are unintentional.  Halina Maidens, MD [facility]  04/17/2020

## 2020-04-17 NOTE — Telephone Encounter (Signed)
Pt has been sch for today

## 2020-04-17 NOTE — Telephone Encounter (Signed)
Please call pt to schedule appt. If pt does not have a cough or fever she's ok to be seen.  KP

## 2020-04-19 ENCOUNTER — Other Ambulatory Visit: Payer: Self-pay

## 2020-04-19 DIAGNOSIS — M25752 Osteophyte, left hip: Secondary | ICD-10-CM

## 2020-04-19 DIAGNOSIS — M25552 Pain in left hip: Secondary | ICD-10-CM

## 2020-04-19 DIAGNOSIS — M4820 Kissing spine, site unspecified: Secondary | ICD-10-CM

## 2020-04-30 ENCOUNTER — Other Ambulatory Visit: Payer: Self-pay

## 2020-04-30 ENCOUNTER — Encounter: Payer: Self-pay | Admitting: Gastroenterology

## 2020-04-30 ENCOUNTER — Ambulatory Visit: Payer: Managed Care, Other (non HMO) | Admitting: Gastroenterology

## 2020-04-30 VITALS — BP 106/69 | HR 86 | Temp 98.2°F | Ht 66.0 in | Wt 196.0 lb

## 2020-04-30 DIAGNOSIS — K219 Gastro-esophageal reflux disease without esophagitis: Secondary | ICD-10-CM | POA: Diagnosis not present

## 2020-04-30 DIAGNOSIS — R0789 Other chest pain: Secondary | ICD-10-CM

## 2020-04-30 DIAGNOSIS — K5904 Chronic idiopathic constipation: Secondary | ICD-10-CM | POA: Diagnosis not present

## 2020-04-30 DIAGNOSIS — R140 Abdominal distension (gaseous): Secondary | ICD-10-CM

## 2020-04-30 MED ORDER — OMEPRAZOLE 40 MG PO CPDR: 40.0000 mg | DELAYED_RELEASE_CAPSULE | Freq: Two times a day (BID) | ORAL | 0 refills | Status: DC

## 2020-04-30 MED ORDER — LUBIPROSTONE 24 MCG PO CAPS: 24.0000 ug | ORAL_CAPSULE | Freq: Two times a day (BID) | ORAL | 5 refills | Status: DC

## 2020-04-30 MED ORDER — POLYETHYLENE GLYCOL 3350 17 GM/SCOOP PO POWD: 1.0000 | Freq: Every day | ORAL | 3 refills | Status: DC

## 2020-04-30 NOTE — Progress Notes (Signed)
Cephas Darby, MD [address]  [place]  [place], [place] [address]  Main: [phone]  Fax: [phone]    Gastroenterology Consultation  Referring Provider:     Glean Hess, MD Primary Care Physician:  Glean Hess, MD Primary Gastroenterologist:  None Reason for Consultation: Sore throat, atypical chest pain, abdominal bloating        HPI:   Brittany Baldwin is a 49 y.o. African-American female referred by Dr. Army Melia, Jesse Sans, MD  for consultation & management of chronic constipation, atypical chest pain.  Brittany Baldwin underwent EGD with esophageal and gastric biopsies for atypical chest pain and epigastric pain.  EGD was unremarkable including biopsies.  She also reports having an coronary angiogram after the EGD which was unremarkable.  She started running, about 1 mile per day and excited that she is able to do it now.  She thinks her chest pain is getting better.  Also correlates the pain with episodes of tachycardia.  She does not think her symptoms are stress related.  She is currently taking omeprazole 40 mg 2 times a day.  She does report feeling better with regards to her constipation, taking Benefiber 3 times a day along with MiraLAX once a day She is not interested to try tricyclic antidepressant at this time.  She had questions about what is functional GI problem and I answered her concerns in today's visit  Follow-up visit 08/29/2019 Patient reports doing well with regards to her dyspepsia symptoms.  She was able to wean her off PPI.  Currently off medication for about a week and asymptomatic.  She is still struggling with constipation.  MiraLAX and high-fiber diet with fiber supplements is not helping.  She has to take Senokot to have a bowel movement.  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  She does not have any other concerns today  Follow-up visit 04/30/2020 Patient reports  approximately 1 month history of sore throat, was initially treated for sinusitis by Dr. Army Melia in mid November with Claritin, Flonase as well as azithromycin . She originally had postnasal drip, cough, chest tightness and intermittent shortness of breath in early September, went to ER, Was told to take over-the-counter  Decongestants, nasal spray as well as omeprazole. However, her sore throat persisted associated with episodes of food stuck in her lower chest.  She started taking omeprazole 20 mg daily with not much relief.  She reports gagging episodes whenever she brushes and that results in severe burning in her throat.  She denies episodes of typical regurgitation or heartburn that she had before fundoplication.  She also reports abdominal bloating, sensation of incomplete emptying despite being on Amitiza 24 MCG twice daily She denies any significant stress in her life  NSAIDs: None  Antiplts/Anticoagulants/Anti thrombotics: None  She denies family history of GI malignancy She works at a front desk He does not smoke or drink alcohol She is on propranolol for benign tachycardia  GI Procedures:  EGD 07/01/2019 - Normal duodenal bulb and second portion of the duodenum. - A Nissen fundoplication was found. The wrap appears intact. - Normal stomach. Biopsied. - Normal esophagus. Biopsied.  DIAGNOSIS:  A. STOMACH; COLD BIOPSY:  - OXYNTIC MUCOSA WITHOUT PATHOLOGIC CHANGES.  - NEGATIVE FOR H. PYLORI, INTESTINAL METAPLASIA, DYSPLASIA, AND  MALIGNANCY.   B. ESOPHAGUS, DISTAL; COLD BIOPSY:  - STRATIFIED SQUAMOUS EPITHELIUM WITHOUT EOSINOPHILS, NEUTROPHILS, OR  REACTIVE CHANGES.  - EXTREMELY SCANT TINY  STRIPS OF COLUMNAR EPITHELIUM.  - NEGATIVE FOR GOBLET CELLS, DYSPLASIA, AND MALIGNANCY.   C. ESOPHAGUS, PROXIMAL; COLD BIOPSY:  - STRATIFIED SQUAMOUS EPITHELIUM WITHOUT EOSINOPHILS, NEUTROPHILS, OR  REACTIVE CHANGES.  - NEGATIVE FOR DYSPLASIA AND MALIGNANCY.  EGD and colonoscopy  03/10/2017 - Normal duodenal bulb and second portion of the duodenum. - A Nissen fundoplication was found. The wrap appears intact. Dilated to 18 mm. - Normal gastroesophageal junction and esophagus. - No specimens collected.  - Non-bleeding internal hemorrhoids. - The entire examined colon is normal. - No specimens collected.  EGD on 06/05/2014 A. GASTRIC ANTRUM (ENDOSCOPIC BIOPSY):  GASTRIC ANTRAL MUCOSA WITH REACTIVE FOVEOLAR HYPERPLASIA AND MILD CHRONIC GASTRITIS. NO ACTIVE GASTRITIS IS SEEN. IMMUNOHISTOCHEMISTRY FOR HELICOBACTER PYLORI IS NEGATIVE. B. ESOPHAGUS AT GASTROESOPHAGEAL JUNCTION (ENDOSCOPIC BIOPSY):  SQUAMOUS MUCOSA WITH NO PATHOLOGIC ABNORMALITY. NO EVIDENCE OF REFLUX OR EOSINOPHILIC ESOPHAGITIS IS SEEN. NEGATIVE FOR INTESTINAL METAPLASIA, DYSPLASIA OR MALIGNANCY. ----------------------------------------------------------------------------------------------------------- pH Bravo Testing [facility]/12/[facility]  Results of 48 hour pH monitoring by way of an endoscopically  placed pH Bravo probe revealed: - NO Significant presence of acid as evidenced by pH lower than 4  throughout the monitoring period (DeMeester score of 1.4 & 2.1 on  days 1 & 2 respectively) - Given the above and recent workup demonstrating intact Nissen  Fundoplication, No additional reflux testing/interventions  Recommended. ------------------------------------------------------------------------------------------------ NM gastric emptying study 12/22/2014  Findings : The stomach is located normally in the left upper abdomen.  Solid gastric emptying at 60 minutes is 48% (normal range at 60 minutes is >10%). Solid gastric emptying at 120 minutes is 69% (normal range at 120 minutes is >40%). Solid gastric emptying at 244 minutes is 97% (normal range at 240 minutes is >90%).   Past Medical History:  Diagnosis Date  . Anemia    H/O   . Anxiety   . Dysrhythmia    SVT-HAD CARDIAC  ABLATION DONE IN 2016 BUT PT STATES SHE STILL GETS INTERMITTENT TACHYCARDIC AND IS SYPMPTOMATIC WITH SOB, DIZZINESS DURING TACHY EPISODES (06-25-15)  . Fibroid   . GERD (gastroesophageal reflux disease)   . Headache    H/O  . Heart murmur    as child  . History of fundoplication 0/93/2355  . History of pulmonary embolus during pregnancy 05/16/2015   2001  . Hoarseness, persistent 10/06/2017  . Hyperlipemia   . Motion sickness    cars, boats  . PE (pulmonary thromboembolism) ([place]) 2000   while on bed rest during pregnancy  . PONV (postoperative nausea and vomiting)   . Shortness of breath dyspnea    ASSOCIATED WITH TACHYCARDIA  . Status post abdominal hysterectomy 07/02/2015   Status post TAH, bilateral salpingectomy. PATHOLOGY: Uterine fibroids and adenomyosis   . Tachycardia     Past Surgical History:  Procedure Laterality Date  . ABDOMINAL HYSTERECTOMY Bilateral 07/02/2015   Procedure: HYSTERECTOMY ABDOMINAL WITH BS;  Surgeon: Brayton Mars, MD;  Location: [facility];  Service: Gynecology;  Laterality: Bilateral;  . APPENDECTOMY    . CARDIAC ELECTROPHYSIOLOGY STUDY AND ABLATION    . CHOLECYSTECTOMY    . COLONOSCOPY  2013  . COLONOSCOPY WITH PROPOFOL N/A 03/10/2017   Procedure: COLONOSCOPY WITH PROPOFOL;  Surgeon: Lin Landsman, MD;  Location: [place];  Service: Endoscopy;  Laterality: N/A;  . CYSTOSCOPY Bilateral 07/02/2015   Procedure: CYSTOSCOPY;  Surgeon: Brayton Mars, MD;  Location: [facility];  Service: Gynecology;  Laterality: Bilateral;  . ESOPHAGEAL DILATION  03/10/2017   Procedure: ESOPHAGEAL DILATION;  Surgeon:  Lin Landsman, MD;  Location: [place];  Service: Endoscopy;;  . ESOPHAGOGASTRODUODENOSCOPY N/A 03/10/2017   Procedure: ESOPHAGOGASTRODUODENOSCOPY (EGD);  Surgeon: Lin Landsman, MD;  Location: [place];  Service: Endoscopy;  Laterality: N/A;  . ESOPHAGOGASTRODUODENOSCOPY (EGD) WITH PROPOFOL N/A 07/01/2019    Procedure: ESOPHAGOGASTRODUODENOSCOPY (EGD) WITH PROPOFOL;  Surgeon: Lin Landsman, MD;  Location: [place];  Service: Endoscopy;  Laterality: N/A;  . LAPAROTOMY N/A 07/11/2015   Procedure: hematoma evacuation;  Surgeon: Brayton Mars, MD;  Location: [facility];  Service: Gynecology;  Laterality: N/A;  . NISSEN FUNDOPLICATION    . TUBAL LIGATION     Current Outpatient Medications:  .  atorvastatin (LIPITOR) 10 MG tablet, TAKE 1 TABLET(10 MG) BY MOUTH DAILY AT 6 PM, Disp: 90 tablet, Rfl: 3 .  Biotin 1 MG CAPS, Take by mouth., Disp: , Rfl:  .  Multiple Vitamins-Minerals (MULTIVITAMIN WITH MINERALS) tablet, Take 1 tablet by mouth daily., Disp: , Rfl:  .  nadolol (CORGARD) 40 MG tablet, Take 40 mg by mouth 2 (two) times daily., Disp: , Rfl:  .  propranolol (INDERAL) 20 MG tablet, Take 1 tablet (20 mg total) by mouth daily as needed., Disp: 90 tablet, Rfl: 3 .  fluticasone (FLONASE) 50 MCG/ACT nasal spray, Place 1 spray into both nostrils in the morning and at bedtime for 7 days. (Patient not taking: Reported on 04/30/2020), Disp: 1 g, Rfl: 0 .  lubiprostone (AMITIZA) 24 MCG capsule, Take 1 capsule (24 mcg total) by mouth 2 (two) times daily with a meal., Disp: 60 capsule, Rfl: 5 .  meloxicam (MOBIC) 15 MG tablet, Take 1 tablet (15 mg total) by mouth daily. (Patient not taking: Reported on 04/30/2020), Disp: 30 tablet, Rfl: 0 .  omeprazole (PRILOSEC) 40 MG capsule, Take 1 capsule (40 mg total) by mouth 2 (two) times daily before a meal., Disp: 60 capsule, Rfl: 0 .  polyethylene glycol powder (GLYCOLAX/MIRALAX) 17 GM/SCOOP powder, Take 255 g by mouth daily., Disp: 255 g, Rfl: 3    Family History  Problem Relation Age of Onset  . Diabetes Mother   . Prostate cancer Father   . Congestive Heart Failure Father   . Heart failure Father   . Cancer Neg Hx      Social History   Tobacco Use  . Smoking status: Never Smoker  . Smokeless tobacco: Never Used  Vaping Use  .  Vaping Use: Never used  Substance Use Topics  . Alcohol use: No  . Drug use: No    Allergies as of 04/30/2020 - Review Complete 04/30/2020  Allergen Reaction Noted  . Codeine Shortness Of Breath 05/09/2015  . Hydrocodone-acetaminophen Rash 05/09/2015  . Aspirin Other (See Comments) 05/09/2015  . Diphenhydramine hcl Other (See Comments) 05/09/2015  . Adhesive [tape] Rash 06/25/2015    Review of Systems:    All systems reviewed and negative except where noted in HPI.   Physical Exam:  BP 106/69 (BP Location: Left Arm, Patient Position: Sitting, Cuff Size: Normal)   Pulse 86   Temp 98.2 F (36.8 C) (Oral)   Ht 5\' 6"  (1.676 m)   Wt 196 lb (88.9 kg)   LMP 06/26/2015 (Exact Date)   BMI 31.64 kg/m  Patient's last menstrual period was 06/26/2015 (exact date).  General:   Alert,  Well-developed, well-nourished, pleasant and cooperative in NAD Head:  Normocephalic and atraumatic. Eyes:  Sclera clear, no icterus.   Conjunctiva pink. Ears:  Normal auditory acuity. Nose:  No  deformity, discharge, or lesions. Mouth:  No deformity or lesions,oropharynx pink & moist. Neck:  Supple; no masses or thyromegaly. Abdomen:  Normal bowel sounds.  No bruits.  Soft, nontender and non-distended without masses, hepatosplenomegaly or hernias noted.  No guarding or rebound tenderness.   Rectal: Nor performed Msk:  Symmetrical without gross deformities. Good, equal movement & strength bilaterally. Pulses:  Normal pulses noted. Extremities:  No clubbing or edema.  No cyanosis. Neurologic:  Alert and oriented x3;  grossly normal neurologically. Skin:  Intact without significant lesions or rashes. No jaundice. Psych:  Alert and cooperative. Normal mood and affect.  Imaging Studies: Reviewed  Assessment and Plan:   Brittany Baldwin is a 49 y.o. African American female with history of refractory GERD status post Nissen's fundoplication for follow-upofrecurrence of burning in the throat, sensation of  food stuck in the chest, atypical chest pain, chronic constipation  Atypical chest pain, sore throat: Patient was weaned off PPI previously Given recurrence of symptoms, recommend trial of omeprazole 40 mg before meals twice daily for 1 month.  If symptoms are persistent, will consider repeating EGD Extensive work-up has been done in the past which is negative to date EGD unremarkable including esophageal biopsies Encouraged her to exercise regularly and adapt healthy eating habits  Chronic constipation: Ongoing issue Continue high-fiber diet, fiber supplements MiraLAX does not help Patient preferred to try Amitiza, continue 24 MCG twice daily Suspect that her constipation is not under control, recommend MiraLAX bowel prep Encouraged regular physical activity  Colon cancer screening: Repeat in 03/2027    Follow up via [place] in 2 to 3 weeks  Cephas Darby, MD

## 2020-05-14 ENCOUNTER — Other Ambulatory Visit: Payer: Self-pay | Admitting: Internal Medicine

## 2020-05-14 DIAGNOSIS — M25552 Pain in left hip: Secondary | ICD-10-CM

## 2020-05-16 ENCOUNTER — Encounter: Payer: Self-pay | Admitting: Gastroenterology

## 2020-05-17 ENCOUNTER — Other Ambulatory Visit: Payer: Self-pay

## 2020-05-17 DIAGNOSIS — K219 Gastro-esophageal reflux disease without esophagitis: Secondary | ICD-10-CM

## 2020-05-17 NOTE — Telephone Encounter (Signed)
Please advised  

## 2020-05-21 ENCOUNTER — Other Ambulatory Visit: Payer: Self-pay

## 2020-05-21 ENCOUNTER — Encounter: Payer: Self-pay | Admitting: Gastroenterology

## 2020-05-21 MED ORDER — METRONIDAZOLE 500 MG PO TABS: 500.0000 mg | ORAL_TABLET | Freq: Three times a day (TID) | ORAL | 0 refills | Status: AC

## 2020-05-21 NOTE — Telephone Encounter (Signed)
Sent medication to the pharmacy  

## 2020-05-22 ENCOUNTER — Other Ambulatory Visit
Admission: RE | Admit: 2020-05-22 | Discharge: 2020-05-22 | Disposition: A | Discharge status: Home / Self Care | Payer: Managed Care, Other (non HMO) | Source: Ambulatory Visit | Attending: Gastroenterology | Admitting: Gastroenterology

## 2020-05-22 DIAGNOSIS — Z01812 Encounter for preprocedural laboratory examination: Secondary | ICD-10-CM | POA: Diagnosis not present

## 2020-05-22 DIAGNOSIS — Z20822 Contact with and (suspected) exposure to covid-19: Secondary | ICD-10-CM | POA: Insufficient documentation

## 2020-05-22 LAB — SARS CORONAVIRUS 2 (TAT 6-24 HRS): SARS Coronavirus 2: NEGATIVE

## 2020-05-23 ENCOUNTER — Other Ambulatory Visit: Payer: Managed Care, Other (non HMO)

## 2020-05-23 NOTE — Discharge Instructions (Signed)
General Anesthesia, Adult, Care After This sheet gives you information about how to care for yourself after your procedure. Your health care provider may also give you more specific instructions. If you have problems or questions, contact your health care provider. What can I expect after the procedure? After the procedure, the following side effects are common:  Pain or discomfort at the IV site.  Nausea.  Vomiting.  Sore throat.  Trouble concentrating.  Feeling cold or chills.  Weak or tired.  Sleepiness and fatigue.  Soreness and body aches. These side effects can affect parts of the body that were not involved in surgery. Follow these instructions at home:  For at least 24 hours after the procedure:  Have a responsible adult stay with you. It is important to have someone help care for you until you are awake and alert.  Rest as needed.  Do not: ? Participate in activities in which you could fall or become injured. ? Drive. ? Use heavy machinery. ? Drink alcohol. ? Take sleeping pills or medicines that cause drowsiness. ? Make important decisions or sign legal documents. ? Take care of children on your own. Eating and drinking  Follow any instructions from your health care provider about eating or drinking restrictions.  When you feel hungry, start by eating small amounts of foods that are soft and easy to digest (bland), such as toast. Gradually return to your regular diet.  Drink enough fluid to keep your urine pale yellow.  If you vomit, rehydrate by drinking water, juice, or clear broth. General instructions  If you have sleep apnea, surgery and certain medicines can increase your risk for breathing problems. Follow instructions from your health care provider about wearing your sleep device: ? Anytime you are sleeping, including during daytime naps. ? While taking prescription pain medicines, sleeping medicines, or medicines that make you drowsy.  Return to  your normal activities as told by your health care provider. Ask your health care provider what activities are safe for you.  Take over-the-counter and prescription medicines only as told by your health care provider.  If you smoke, do not smoke without supervision.  Keep all follow-up visits as told by your health care provider. This is important. Contact a health care provider if:  You have nausea or vomiting that does not get better with medicine.  You cannot eat or drink without vomiting.  You have pain that does not get better with medicine.  You are unable to pass urine.  You develop a skin rash.  You have a fever.  You have redness around your IV site that gets worse. Get help right away if:  You have difficulty breathing.  You have chest pain.  You have blood in your urine or stool, or you vomit blood. Summary  After the procedure, it is common to have a sore throat or nausea. It is also common to feel tired.  Have a responsible adult stay with you for the first 24 hours after general anesthesia. It is important to have someone help care for you until you are awake and alert.  When you feel hungry, start by eating small amounts of foods that are soft and easy to digest (bland), such as toast. Gradually return to your regular diet.  Drink enough fluid to keep your urine pale yellow.  Return to your normal activities as told by your health care provider. Ask your health care provider what activities are safe for you. This information is not   intended to replace advice given to you by your health care provider. Make sure you discuss any questions you have with your health care provider. Document Revised: 05/22/2017 Document Reviewed: 01/02/2017 Elsevier Patient Education  2020 [employer].  

## 2020-05-24 ENCOUNTER — Ambulatory Visit: Payer: Managed Care, Other (non HMO) | Admitting: Anesthesiology

## 2020-05-24 ENCOUNTER — Ambulatory Visit
Admission: RE | Admit: 2020-05-24 | Discharge: 2020-05-24 | Disposition: A | Discharge status: Home / Self Care | Payer: Managed Care, Other (non HMO) | Attending: Gastroenterology | Admitting: Gastroenterology

## 2020-05-24 ENCOUNTER — Other Ambulatory Visit: Payer: Self-pay

## 2020-05-24 ENCOUNTER — Encounter
Admission: RE | Disposition: A | Discharge status: Home / Self Care | Payer: Self-pay | Source: Home / Self Care | Attending: Gastroenterology

## 2020-05-24 ENCOUNTER — Encounter: Payer: Self-pay | Admitting: Gastroenterology

## 2020-05-24 DIAGNOSIS — Z886 Allergy status to analgesic agent status: Secondary | ICD-10-CM | POA: Diagnosis not present

## 2020-05-24 DIAGNOSIS — Z791 Long term (current) use of non-steroidal anti-inflammatories (NSAID): Secondary | ICD-10-CM | POA: Insufficient documentation

## 2020-05-24 DIAGNOSIS — Z885 Allergy status to narcotic agent status: Secondary | ICD-10-CM | POA: Insufficient documentation

## 2020-05-24 DIAGNOSIS — K219 Gastro-esophageal reflux disease without esophagitis: Secondary | ICD-10-CM | POA: Diagnosis not present

## 2020-05-24 DIAGNOSIS — K3189 Other diseases of stomach and duodenum: Secondary | ICD-10-CM | POA: Diagnosis not present

## 2020-05-24 DIAGNOSIS — Z9889 Other specified postprocedural states: Secondary | ICD-10-CM | POA: Diagnosis not present

## 2020-05-24 DIAGNOSIS — R1013 Epigastric pain: Secondary | ICD-10-CM | POA: Diagnosis present

## 2020-05-24 DIAGNOSIS — Z79899 Other long term (current) drug therapy: Secondary | ICD-10-CM | POA: Diagnosis not present

## 2020-05-24 HISTORY — PX: ESOPHAGOGASTRODUODENOSCOPY (EGD) WITH PROPOFOL: SHX5813

## 2020-05-24 HISTORY — PX: BIOPSY: SHX5522

## 2020-05-24 SURGERY — ESOPHAGOGASTRODUODENOSCOPY (EGD) WITH PROPOFOL
Anesthesia: General | Site: Throat

## 2020-05-24 MED ORDER — GLYCOPYRROLATE 0.2 MG/ML IJ SOLN
INTRAMUSCULAR | Status: DC | PRN
  Administered 2020-05-24: .1 mg via INTRAVENOUS

## 2020-05-24 MED ORDER — STERILE WATER FOR IRRIGATION IR SOLN
Status: DC | PRN
  Administered 2020-05-24: 10:00:00 100 mL

## 2020-05-24 MED ORDER — PROPOFOL 10 MG/ML IV BOLUS
INTRAVENOUS | Status: DC | PRN
  Administered 2020-05-24: 80 mg via INTRAVENOUS
  Administered 2020-05-24 (×3): 40 mg via INTRAVENOUS
  Administered 2020-05-24 (×6): 20 mg via INTRAVENOUS

## 2020-05-24 MED ORDER — LACTATED RINGERS IV SOLN: INTRAVENOUS | Status: DC

## 2020-05-24 MED ORDER — LIDOCAINE HCL (CARDIAC) PF 100 MG/5ML IV SOSY
PREFILLED_SYRINGE | INTRAVENOUS | Status: DC | PRN
  Administered 2020-05-24: 50 mg via INTRAVENOUS

## 2020-05-24 MED ORDER — SODIUM CHLORIDE 0.9 % IV SOLN: INTRAVENOUS | Status: DC

## 2020-05-24 SURGICAL SUPPLY — 34 items
BALLN DILATOR 10-12 8 (BALLOONS)
BALLN DILATOR 12-15 8 (BALLOONS)
BALLN DILATOR 15-18 8 (BALLOONS)
BALLN DILATOR CRE 0-12 8 (BALLOONS)
BALLN DILATOR ESOPH 8 10 CRE (MISCELLANEOUS) IMPLANT
BALLOON DILATOR 12-15 8 (BALLOONS) IMPLANT
BALLOON DILATOR 15-18 8 (BALLOONS) IMPLANT
BALLOON DILATOR CRE 0-12 8 (BALLOONS) IMPLANT
BLOCK BITE 60FR ADLT L/F GRN (MISCELLANEOUS) ×3 IMPLANT
CLIP HMST 235XBRD CATH ROT (MISCELLANEOUS) IMPLANT
CLIP RESOLUTION 360 11X235 (MISCELLANEOUS)
ELECT REM PT RETURN 9FT ADLT (ELECTROSURGICAL)
ELECTRODE REM PT RTRN 9FT ADLT (ELECTROSURGICAL) IMPLANT
FCP ESCP3.2XJMB 240X2.8X (MISCELLANEOUS)
FORCEPS BIOP RAD 4 LRG CAP 4 (CUTTING FORCEPS) ×3 IMPLANT
FORCEPS BIOP RJ4 240 W/NDL (MISCELLANEOUS)
FORCEPS ESCP3.2XJMB 240X2.8X (MISCELLANEOUS) IMPLANT
GOWN CVR UNV OPN BCK APRN NK (MISCELLANEOUS) ×2 IMPLANT
GOWN ISOL THUMB LOOP REG UNIV (MISCELLANEOUS) ×6
INJECTOR VARIJECT VIN23 (MISCELLANEOUS) IMPLANT
KIT DEFENDO VALVE AND CONN (KITS) IMPLANT
KIT PRC NS LF DISP ENDO (KITS) ×1 IMPLANT
KIT PROCEDURE OLYMPUS (KITS) ×3
MANIFOLD NEPTUNE II (INSTRUMENTS) ×3 IMPLANT
MARKER SPOT ENDO TATTOO 5ML (MISCELLANEOUS) IMPLANT
RETRIEVER NET PLAT FOOD (MISCELLANEOUS) IMPLANT
SNARE SHORT THROW 13M SML OVAL (MISCELLANEOUS) IMPLANT
SNARE SHORT THROW 30M LRG OVAL (MISCELLANEOUS) IMPLANT
SPOT EX ENDOSCOPIC TATTOO (MISCELLANEOUS)
SYR INFLATION 60ML (SYRINGE) IMPLANT
TRAP ETRAP POLY (MISCELLANEOUS) IMPLANT
VARIJECT INJECTOR VIN23 (MISCELLANEOUS)
WATER STERILE IRR 250ML POUR (IV SOLUTION) ×3 IMPLANT
WIRE CRE 18-20MM 8CM F G (MISCELLANEOUS) IMPLANT

## 2020-05-24 NOTE — Anesthesia Postprocedure Evaluation (Signed)
Anesthesia Post Note  Patient: Brittany Baldwin  Procedure(s) Performed: ESOPHAGOGASTRODUODENOSCOPY (EGD) WITH PROPOFOL (N/A Throat) BIOPSY (N/A Throat)     Patient location during evaluation: PACU Anesthesia Type: General Level of consciousness: awake and alert Pain management: pain level controlled Vital Signs Assessment: post-procedure vital signs reviewed and stable Cardiovascular status: stable Anesthetic complications: no   No complications documented.  Gillian Scarce

## 2020-05-24 NOTE — Op Note (Signed)
[facility] Patient Name: Brittany Baldwin Procedure Date: 05/24/2020 9:34 AM MRN: [id number] Account #: [id number] Date of Birth: 1970-09-23 Admit Type: Outpatient Age: 49 Room: [facility] OR ROOM 01 Gender: Female Note Status: Finalized Procedure:             Upper GI endoscopy Indications:           Dyspepsia, Esophageal reflux symptoms that persist                         despite appropriate therapy Providers:             Lin Landsman MD, MD Referring MD:          Halina Maidens, MD (Referring MD) Medicines:             General Anesthesia Complications:         No immediate complications. Estimated blood loss: None. Procedure:             Pre-Anesthesia Assessment:                        - Prior to the procedure, a History and Physical was                         performed, and patient medications and allergies were                         reviewed. The patient is competent. The risks and                         benefits of the procedure and the sedation options and                         risks were discussed with the patient. All questions                         were answered and informed consent was obtained.                         Patient identification and proposed procedure were                         verified by the physician, the nurse, the                         anesthesiologist, the anesthetist and the technician                         in the pre-procedure area in the procedure room in the                         endoscopy suite. Mental Status Examination: alert and                         oriented. Airway Examination: normal oropharyngeal                         airway and neck mobility. Respiratory Examination:  clear to auscultation. CV Examination: normal.                         Prophylactic Antibiotics: The patient does not require                         prophylactic antibiotics. Prior Anticoagulants:  The                         patient has taken no previous anticoagulant or                         antiplatelet agents. ASA Grade Assessment: II - A                         patient with mild systemic disease. After reviewing                         the risks and benefits, the patient was deemed in                         satisfactory condition to undergo the procedure. The                         anesthesia plan was to use general anesthesia.                         Immediately prior to administration of medications,                         the patient was re-assessed for adequacy to receive                         sedatives. The heart rate, respiratory rate, oxygen                         saturations, blood pressure, adequacy of pulmonary                         ventilation, and response to care were monitored                         throughout the procedure. The physical status of the                         patient was re-assessed after the procedure.                        After obtaining informed consent, the endoscope was                         passed under direct vision. Throughout the procedure,                         the patient's blood pressure, pulse, and oxygen                         saturations were monitored continuously. The was  introduced through the mouth, and advanced to the                         second part of duodenum. The upper GI endoscopy was                         accomplished without difficulty. The patient tolerated                         the procedure well. Findings:      The duodenal bulb and second portion of the duodenum were normal.      Diffuse mildly erythematous mucosa without bleeding was found in the       gastric body and in the gastric antrum. Biopsies were taken with a cold       forceps for Helicobacter pylori testing.      Evidence of a partial fundoplication was found in the cardia. The wrap       appeared somewhat  loose. This was traversed.      Esophagogastric landmarks were identified: the gastroesophageal junction       was found at 39 cm from the incisors.      The gastroesophageal junction and examined esophagus were normal.       Biopsies were taken with a cold forceps for histology. Impression:            - Normal duodenal bulb and second portion of the                         duodenum.                        - Erythematous mucosa in the gastric body and antrum.                         Biopsied.                        - A partial fundoplication was found. The wrap appears                         loose.                        - Esophagogastric landmarks identified.                        - Normal gastroesophageal junction and esophagus.                         Biopsied. Recommendation:        - Await pathology results.                        - Discharge patient to home (with escort).                        - Resume previous diet today.                        - Continue present medications.                        -  Return to my office as previously scheduled. Procedure Code(s):     --- Professional ---                        [phone], Esophagogastroduodenoscopy, flexible,                         transoral; with biopsy, single or multiple Diagnosis Code(s):     --- Professional ---                        K31.89, Other diseases of stomach and duodenum                        Z98.890, Other specified postprocedural states                        R10.13, Epigastric pain                        K21.9, Gastro-esophageal reflux disease without                         esophagitis CPT copyright 2019 American Medical Association. All rights reserved. The codes documented in this report are preliminary and upon coder review may  be revised to meet current compliance requirements. Dr. Libby Maw Toney Reil MD, MD 05/24/2020 10:01:10 AM This report has been signed electronically. Number of  Addenda: 0 Note Initiated On: 05/24/2020 9:34 AM Estimated Blood Loss:  Estimated blood loss: none.      [facility]

## 2020-05-24 NOTE — H&P (Signed)
Cephas Darby, MD [address]  [place]  [place], [place] [address]  Main: [phone]  Fax: [phone] Pager: [phone]  Primary Care Physician:  Glean Hess, MD Primary Gastroenterologist:  Dr. Cephas Darby  Pre-Procedure History & Physical: HPI:  Brittany Baldwin is a 49 y.o. female is here for an endoscopy.   Past Medical History:  Diagnosis Date  . Anemia    H/O   . Anxiety   . Dysrhythmia    SVT-HAD CARDIAC ABLATION DONE IN 2016 BUT PT STATES SHE STILL GETS INTERMITTENT TACHYCARDIC AND IS SYPMPTOMATIC WITH SOB, DIZZINESS DURING TACHY EPISODES (06-25-15)  . Fibroid   . GERD (gastroesophageal reflux disease)   . Headache    H/O  . Heart murmur    as child  . History of fundoplication [id number]  . History of pulmonary embolus during pregnancy 05/16/2015   2001  . Hoarseness, persistent 10/06/2017  . Hyperlipemia   . Motion sickness    cars, boats  . PE (pulmonary thromboembolism) ([place]) 2000   while on bed rest during pregnancy  . PONV (postoperative nausea and vomiting)   . Shortness of breath dyspnea    ASSOCIATED WITH TACHYCARDIA  . Status post abdominal hysterectomy 07/02/2015   Status post TAH, bilateral salpingectomy. PATHOLOGY: Uterine fibroids and adenomyosis   . Tachycardia     Past Surgical History:  Procedure Laterality Date  . ABDOMINAL HYSTERECTOMY Bilateral 07/02/2015   Procedure: HYSTERECTOMY ABDOMINAL WITH BS;  Surgeon: Brayton Mars, MD;  Location: [facility];  Service: Gynecology;  Laterality: Bilateral;  . APPENDECTOMY    . CARDIAC ELECTROPHYSIOLOGY STUDY AND ABLATION    . CHOLECYSTECTOMY    . COLONOSCOPY  2013  . COLONOSCOPY WITH PROPOFOL N/A 03/10/2017   Procedure: COLONOSCOPY WITH PROPOFOL;  Surgeon: Lin Landsman, MD;  Location: [place];  Service: Endoscopy;  Laterality: N/A;  . CYSTOSCOPY Bilateral 07/02/2015   Procedure: CYSTOSCOPY;  Surgeon: Brayton Mars, MD;  Location: [facility];  Service:  Gynecology;  Laterality: Bilateral;  . ESOPHAGEAL DILATION  03/10/2017   Procedure: ESOPHAGEAL DILATION;  Surgeon: Lin Landsman, MD;  Location: [place];  Service: Endoscopy;;  . ESOPHAGOGASTRODUODENOSCOPY N/A 03/10/2017   Procedure: ESOPHAGOGASTRODUODENOSCOPY (EGD);  Surgeon: Lin Landsman, MD;  Location: [place];  Service: Endoscopy;  Laterality: N/A;  . ESOPHAGOGASTRODUODENOSCOPY (EGD) WITH PROPOFOL N/A 07/01/2019   Procedure: ESOPHAGOGASTRODUODENOSCOPY (EGD) WITH PROPOFOL;  Surgeon: Lin Landsman, MD;  Location: [place];  Service: Endoscopy;  Laterality: N/A;  . LAPAROTOMY N/A 07/11/2015   Procedure: hematoma evacuation;  Surgeon: Brayton Mars, MD;  Location: [facility];  Service: Gynecology;  Laterality: N/A;  . NISSEN FUNDOPLICATION    . TUBAL LIGATION      Prior to Admission medications   Medication Sig Start Date End Date Taking? Authorizing Provider  atorvastatin (LIPITOR) 10 MG tablet TAKE 1 TABLET(10 MG) BY MOUTH DAILY AT 6 PM 07/18/19  Yes Glean Hess, MD  Biotin 1 MG CAPS Take by mouth.   Yes [provider]  lubiprostone (AMITIZA) 24 MCG capsule Take 1 capsule (24 mcg total) by mouth 2 (two) times daily with a meal. 04/30/20  Yes Rual Vermeer, Tally Due, MD  meloxicam (MOBIC) 15 MG tablet TAKE 1 TABLET(15 MG) BY MOUTH DAILY 05/14/20  Yes Glean Hess, MD  metroNIDAZOLE (FLAGYL) 500 MG tablet Take 1 tablet (500 mg total) by mouth 3 (three) times daily for 10 days. 05/21/20 05/31/20 Yes Richell Corker  Reece Levy, MD  Multiple Vitamins-Minerals (MULTIVITAMIN WITH MINERALS) tablet Take 1 tablet by mouth daily.   Yes [provider]  nadolol (CORGARD) 40 MG tablet Take 40 mg by mouth 2 (two) times daily.   Yes [provider]  omeprazole (PRILOSEC) 40 MG capsule Take 1 capsule (40 mg total) by mouth 2 (two) times daily before a meal. 04/30/20 05/30/20 Yes Shylee Durrett, Tally Due, MD  polyethylene glycol  powder (GLYCOLAX/MIRALAX) 17 GM/SCOOP powder Take 255 g by mouth daily. 04/30/20  Yes Caprisha Bridgett, Tally Due, MD  propranolol (INDERAL) 20 MG tablet Take 1 tablet (20 mg total) by mouth daily as needed. 06/23/18  Yes Glean Hess, MD  fluticasone [facility]) 50 MCG/ACT nasal spray Place 1 spray into both nostrils in the morning and at bedtime for 7 days. Patient not taking: Reported on 04/30/2020 02/09/20 04/17/20  Laurene Footman B, PA-C    Allergies as of 05/17/2020 - Review Complete 04/30/2020  Allergen Reaction Noted  . Codeine Shortness Of Breath 05/09/2015  . Hydrocodone-acetaminophen Rash 05/09/2015  . Aspirin Other (See Comments) 05/09/2015  . Diphenhydramine hcl Other (See Comments) 05/09/2015  . Adhesive [tape] Rash 06/25/2015    Family History  Problem Relation Age of Onset  . Diabetes Mother   . Prostate cancer Father   . Congestive Heart Failure Father   . Heart failure Father   . Cancer Neg Hx     Social History   Socioeconomic History  . Marital status: Married    Spouse name: Not on file  . Number of children: Not on file  . Years of education: Not on file  . Highest education level: Not on file  Occupational History  . Not on file  Tobacco Use  . Smoking status: Never Smoker  . Smokeless tobacco: Never Used  Vaping Use  . Vaping Use: Never used  Substance and Sexual Activity  . Alcohol use: No  . Drug use: No  . Sexual activity: Yes    Birth control/protection: Surgical  Other Topics Concern  . Not on file  Social History Narrative  . Not on file   Social Determinants of Health   Financial Resource Strain: Not on file  Food Insecurity: Not on file  Transportation Needs: Not on file  Physical Activity: Not on file  Stress: Not on file  Social Connections: Not on file  Intimate Partner Violence: Not on file    Review of Systems: See HPI, otherwise negative ROS  Physical Exam: BP 107/81   Pulse 73   Temp (!) 97.5 F (36.4 C) (Temporal)    Resp 16   Ht 5\' 6"  (1.676 m)   Wt 89.8 kg   LMP 06/26/2015 (Exact Date)   SpO2 100%   BMI 31.96 kg/m  General:   Alert,  pleasant and cooperative in NAD Head:  Normocephalic and atraumatic. Neck:  Supple; no masses or thyromegaly. Lungs:  Clear throughout to auscultation.    Heart:  Regular rate and rhythm. Abdomen:  Soft, nontender and nondistended. Normal bowel sounds, without guarding, and without rebound.   Neurologic:  Alert and  oriented x4;  grossly normal neurologically.  Impression/Plan: Brittany Baldwin is here for an endoscopy to be performed for gerd  Risks, benefits, limitations, and alternatives regarding  endoscopy have been reviewed with the patient.  Questions have been answered.  All parties agreeable.   Sherri Sear, MD  05/24/2020, 9:30 AM

## 2020-05-24 NOTE — Transfer of Care (Signed)
Immediate Anesthesia Transfer of Care Note  Patient: Brittany Baldwin  Procedure(s) Performed: ESOPHAGOGASTRODUODENOSCOPY (EGD) WITH PROPOFOL (N/A Throat) BIOPSY (N/A Throat)  Patient Location: PACU  Anesthesia Type: General  Level of Consciousness: awake, alert  and patient cooperative  Airway and Oxygen Therapy: Patient Spontanous Breathing and Patient connected to supplemental oxygen  Post-op Assessment: Post-op Vital signs reviewed, Patient's Cardiovascular Status Stable, Respiratory Function Stable, Patent Airway and No signs of Nausea or vomiting  Post-op Vital Signs: Reviewed and stable  Complications: No complications documented.

## 2020-05-24 NOTE — Anesthesia Preprocedure Evaluation (Signed)
Anesthesia Evaluation  Patient identified by MRN, date of birth, ID band Patient awake    Airway Mallampati: II  TM Distance: >3 FB Neck ROM: full    Dental no notable dental hx.    Pulmonary    Pulmonary exam normal        Cardiovascular Normal cardiovascular exam Rhythm:regular Rate:Normal     Neuro/Psych  Headaches,    GI/Hepatic Neg liver ROS, Medicated,  Endo/Other  negative endocrine ROS  Renal/GU negative Renal ROS  negative genitourinary   Musculoskeletal   Abdominal   Peds  Hematology  (+) Blood dyscrasia, anemia ,   Anesthesia Other Findings   Reproductive/Obstetrics                             Anesthesia Physical Anesthesia Plan  ASA: II  Anesthesia Plan: General   Post-op Pain Management:    Induction:   PONV Risk Score and Plan: 3 and Treatment may vary due to age or medical condition, TIVA and Propofol infusion  Airway Management Planned:   Additional Equipment:   Intra-op Plan:   Post-operative Plan:   Informed Consent: I have reviewed the patients History and Physical, chart, labs and discussed the procedure including the risks, benefits and alternatives for the proposed anesthesia with the patient or authorized representative who has indicated his/her understanding and acceptance.       Plan Discussed with:   Anesthesia Plan Comments:        Anesthesia Quick Evaluation

## 2020-05-24 NOTE — Anesthesia Procedure Notes (Signed)
Performed by: Mayme Genta, CRNA Pre-anesthesia Checklist: Patient identified, Emergency Drugs available, Suction available, Timeout performed and Patient being monitored Patient Re-evaluated:Patient Re-evaluated prior to induction Oxygen Delivery Method: Nasal cannula Placement Confirmation: positive ETCO2

## 2020-05-28 ENCOUNTER — Ambulatory Visit: Payer: Managed Care, Other (non HMO) | Attending: Internal Medicine

## 2020-05-28 DIAGNOSIS — Z23 Encounter for immunization: Secondary | ICD-10-CM

## 2020-05-28 LAB — SURGICAL PATHOLOGY

## 2020-05-28 NOTE — Progress Notes (Signed)
   Covid-19 Vaccination Clinic  Name:  Brittany Baldwin    MRN: [id number] DOB: 1970/11/09  05/28/2020  [name] was observed post Covid-19 immunization for 15 minutes without incident. She was provided with Vaccine Information Sheet and instruction to access the V-Safe system.   [name] was instructed to call 911 with any severe reactions post vaccine: Marland Kitchen Difficulty breathing  . Swelling of face and throat  . A fast heartbeat  . A bad rash all over body  . Dizziness and weakness   Immunizations Administered    Name Date Dose VIS Date Route   Pfizer COVID-19 Vaccine 05/28/2020  1:36 PM 0.3 mL 03/21/2020 Intramuscular   Manufacturer: [employer], [employer]   Lot: [id number]   NDC: [address]-7

## 2020-07-01 ENCOUNTER — Other Ambulatory Visit: Payer: Self-pay | Admitting: Gastroenterology

## 2020-07-17 ENCOUNTER — Other Ambulatory Visit: Payer: Self-pay | Admitting: Internal Medicine

## 2020-07-17 DIAGNOSIS — E782 Mixed hyperlipidemia: Secondary | ICD-10-CM

## 2020-07-30 ENCOUNTER — Encounter: Payer: Self-pay | Admitting: Internal Medicine

## 2020-08-21 ENCOUNTER — Other Ambulatory Visit: Payer: Self-pay | Admitting: Internal Medicine

## 2020-08-21 DIAGNOSIS — E782 Mixed hyperlipidemia: Secondary | ICD-10-CM

## 2020-08-21 NOTE — Telephone Encounter (Signed)
   Notes to clinic: Patient has upcoming appt on 09/03/2020 Script is expired Review for continued use and refill    Requested Prescriptions  Pending Prescriptions Disp Refills   atorvastatin (LIPITOR) 10 MG tablet [Pharmacy Med Name: ATORVASTATIN 10MG  TABLETS] 90 tablet     Sig: TAKE 1 TABLET(10 MG) BY MOUTH DAILY AT 6 PM      Cardiovascular:  Antilipid - Statins Failed - 08/21/2020  8:18 AM      Failed - Total Cholesterol in normal range and within 360 days    Cholesterol, Total  Date Value Ref Range Status  06/29/2019 181 100 - 199 mg/dL Final          Failed - LDL in normal range and within 360 days    LDL Chol Calc (NIH)  Date Value Ref Range Status  06/29/2019 116 (H) 0 - 99 mg/dL Final          Failed - HDL in normal range and within 360 days    HDL  Date Value Ref Range Status  06/29/2019 50 >39 mg/dL Final          Failed - Triglycerides in normal range and within 360 days    Triglycerides  Date Value Ref Range Status  06/29/2019 78 0 - 149 mg/dL Final          Failed - Valid encounter within last 12 months    Recent Outpatient Visits           4 months ago Acute non-recurrent maxillary sinusitis   [facility] Glean Hess, MD   1 year ago Annual physical exam   [facility] Glean Hess, MD   1 year ago Tachycardia   [facility] Glean Hess, MD   2 years ago Acanthosis nigricans   [facility] Glean Hess, MD   2 years ago Annual physical exam   [facility] Glean Hess, MD       Future Appointments             In 1 week Army Melia Jesse Sans, MD [facility] [place] - Patient is not pregnant

## 2020-09-03 ENCOUNTER — Ambulatory Visit (INDEPENDENT_AMBULATORY_CARE_PROVIDER_SITE_OTHER): Payer: PRIVATE HEALTH INSURANCE | Admitting: Internal Medicine

## 2020-09-03 ENCOUNTER — Encounter: Payer: Self-pay | Admitting: Internal Medicine

## 2020-09-03 ENCOUNTER — Other Ambulatory Visit: Payer: Self-pay

## 2020-09-03 VITALS — BP 106/78 | HR 73 | Temp 98.0°F | Ht 66.0 in | Wt 196.0 lb

## 2020-09-03 DIAGNOSIS — K219 Gastro-esophageal reflux disease without esophagitis: Secondary | ICD-10-CM

## 2020-09-03 DIAGNOSIS — R000 Tachycardia, unspecified: Secondary | ICD-10-CM

## 2020-09-03 DIAGNOSIS — Z0000 Encounter for general adult medical examination without abnormal findings: Secondary | ICD-10-CM | POA: Diagnosis not present

## 2020-09-03 DIAGNOSIS — Z1159 Encounter for screening for other viral diseases: Secondary | ICD-10-CM | POA: Diagnosis not present

## 2020-09-03 DIAGNOSIS — E782 Mixed hyperlipidemia: Secondary | ICD-10-CM

## 2020-09-03 DIAGNOSIS — K5901 Slow transit constipation: Secondary | ICD-10-CM

## 2020-09-03 NOTE — Progress Notes (Signed)
Date:  09/03/2020   Name:  Brittany Baldwin   DOB:  [birth year]   MRN:  [id number]   Chief Complaint: Annual Exam (Breast exam no pap)  Brittany Baldwin is a 50 y.o. female who presents today for her Complete Annual Exam. She feels well. She reports exercising walking 7 days a week. She reports she is sleeping fairly well. Breast complaints none.  Mammogram: 05/2020 @ [facility] DEXA: none Pap smear: discontinued Colonoscopy: 03/2017 repeat 2028  Immunization History  Administered Date(s) Administered  . PFIZER(Purple Top)SARS-COV-2 Vaccination 08/29/2019, 09/21/2019, 05/28/2020  Not a candidate for Shingrix due to no hx of varicella  Gastroesophageal Reflux She reports no abdominal pain, no chest pain, no coughing or no wheezing. Pertinent negatives include no fatigue. She has tried a PPI (PPI now discontinued by GI) for the symptoms. The treatment provided significant relief. Past procedures include an EGD.  Constipation This is a chronic problem. The problem is unchanged. Pertinent negatives include no abdominal pain, diarrhea, fever or vomiting. She has tried laxatives and fiber (currently on Amitiza) for the symptoms.  Hyperlipidemia This is a chronic problem. The problem is controlled. Pertinent negatives include no chest pain or shortness of breath. Current antihyperlipidemic treatment includes statins. There are no compliance problems.   Tachycardia - s/p ablation; on nadolol daily and PRN propranolol.  Lab Results  Component Value Date   CREATININE 0.96 06/29/2019   BUN 9 06/29/2019   NA 138 06/29/2019   K 4.3 06/29/2019   CL 104 06/29/2019   CO2 21 06/29/2019   Lab Results  Component Value Date   CHOL 181 06/29/2019   HDL 50 06/29/2019   LDLCALC 116 (H) 06/29/2019   TRIG 78 06/29/2019   CHOLHDL 3.6 06/29/2019   Lab Results  Component Value Date   TSH 2.810 06/29/2019   No results found for: HGBA1C Lab Results  Component Value Date   WBC 4.7 11/30/2019   HGB 13.7  11/30/2019   HCT 41.3 11/30/2019   MCV 90.2 11/30/2019   PLT 205.0 11/30/2019   Lab Results  Component Value Date   ALT 21 06/29/2019   AST 17 06/29/2019   ALKPHOS 46 06/29/2019   BILITOT 0.6 06/29/2019     Review of Systems  Constitutional: Negative for chills, fatigue and fever.  HENT: Negative for congestion, hearing loss, tinnitus, trouble swallowing and voice change.   Eyes: Negative for visual disturbance.  Respiratory: Negative for cough, chest tightness, shortness of breath and wheezing.   Cardiovascular: Negative for chest pain, palpitations and leg swelling.  Gastrointestinal: Positive for constipation. Negative for abdominal pain, diarrhea and vomiting.  Endocrine: Negative for polydipsia and polyuria.  Genitourinary: Negative for dysuria, frequency, genital sores, vaginal bleeding and vaginal discharge.  Musculoskeletal: Negative for arthralgias, gait problem and joint swelling.  Skin: Negative for color change and rash.  Neurological: Negative for dizziness, tremors, light-headedness and headaches.  Hematological: Negative for adenopathy. Does not bruise/bleed easily.  Psychiatric/Behavioral: Negative for dysphoric mood and sleep disturbance. The patient is not nervous/anxious.     Patient Active Problem List   Diagnosis Date Noted  . Osteophyte of left hip 04/19/2020  . Acanthosis nigricans 07/30/2018  . Esophageal dysphagia   . Functional dyspepsia 02/20/2017  . Dependent edema 08/05/2016  . Plantar fasciitis, bilateral 08/05/2016  . 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 S/P total hysterectomy 07/02/2015  . Tachycardia 05/10/2015  . Gastroesophageal reflux disease without esophagitis 03/28/2014  .  Hyperlipidemia, mixed 03/28/2014    Allergies  Allergen Reactions  . Codeine Shortness Of Breath  . Hydrocodone-Acetaminophen Rash  . Aspirin Other (See Comments)    Stomach issues  . Diphenhydramine Hcl  Other (See Comments)    Through an I.V. Drip in addition to other meds. Caused hallucinations  . Adhesive [Tape] Rash    ELECTRODES FROM HOLTER MONITOR CAUSED RASH    Past Surgical History:  Procedure Laterality Date  . ABDOMINAL HYSTERECTOMY Bilateral 07/02/2015   Procedure: HYSTERECTOMY ABDOMINAL WITH BS;  Surgeon: Brayton Mars, MD;  Location: [facility];  Service: Gynecology;  Laterality: Bilateral;  . APPENDECTOMY    . BIOPSY N/A 05/24/2020   Procedure: BIOPSY;  Surgeon: Lin Landsman, MD;  Location: [place];  Service: Endoscopy;  Laterality: N/A;  . CARDIAC ELECTROPHYSIOLOGY STUDY AND ABLATION    . CHOLECYSTECTOMY    . COLONOSCOPY  2013  . COLONOSCOPY WITH PROPOFOL N/A 03/10/2017   Procedure: COLONOSCOPY WITH PROPOFOL;  Surgeon: Lin Landsman, MD;  Location: [place];  Service: Endoscopy;  Laterality: N/A;  . CYSTOSCOPY Bilateral 07/02/2015   Procedure: CYSTOSCOPY;  Surgeon: Brayton Mars, MD;  Location: [facility];  Service: Gynecology;  Laterality: Bilateral;  . ESOPHAGEAL DILATION  03/10/2017   Procedure: ESOPHAGEAL DILATION;  Surgeon: Lin Landsman, MD;  Location: [place];  Service: Endoscopy;;  . ESOPHAGOGASTRODUODENOSCOPY N/A 03/10/2017   Procedure: ESOPHAGOGASTRODUODENOSCOPY (EGD);  Surgeon: Lin Landsman, MD;  Location: [place];  Service: Endoscopy;  Laterality: N/A;  . ESOPHAGOGASTRODUODENOSCOPY (EGD) WITH PROPOFOL N/A 07/01/2019   Procedure: ESOPHAGOGASTRODUODENOSCOPY (EGD) WITH PROPOFOL;  Surgeon: Lin Landsman, MD;  Location: [place];  Service: Endoscopy;  Laterality: N/A;  . ESOPHAGOGASTRODUODENOSCOPY (EGD) WITH PROPOFOL N/A 05/24/2020   Procedure: ESOPHAGOGASTRODUODENOSCOPY (EGD) WITH PROPOFOL;  Surgeon: Lin Landsman, MD;  Location: [place];  Service: Endoscopy;  Laterality: N/A;  . LAPAROTOMY N/A 07/11/2015   Procedure: hematoma evacuation;  Surgeon: Brayton Mars, MD;  Location: [facility];  Service: Gynecology;  Laterality: N/A;  . NISSEN FUNDOPLICATION    . TUBAL LIGATION      Social History   Tobacco Use  . Smoking status: Never Smoker  . Smokeless tobacco: Never Used  Vaping Use  . Vaping Use: Never used  Substance Use Topics  . Alcohol use: No  . Drug use: No     Medication list has been reviewed and updated.  Current Meds  Medication Sig  . atorvastatin (LIPITOR) 10 MG tablet TAKE 1 TABLET(10 MG) BY MOUTH DAILY AT 6 PM  . Biotin 1 MG CAPS Take by mouth.  . lubiprostone (AMITIZA) 24 MCG capsule Take 1 capsule (24 mcg total) by mouth 2 (two) times daily with a meal.  . Multiple Vitamins-Minerals (MULTIVITAMIN WITH MINERALS) tablet Take 1 tablet by mouth daily.  . nadolol (CORGARD) 40 MG tablet Take 40 mg by mouth 2 (two) times daily.  . propranolol (INDERAL) 20 MG tablet Take 1 tablet (20 mg total) by mouth daily as needed.    PHQ 2/9 Scores 09/03/2020 04/17/2020 06/29/2019 02/28/2019  PHQ - 2 Score 0 0 0 0  PHQ- 9 Score 0 0 - -    GAD 7 : Generalized Anxiety Score 09/03/2020 04/17/2020  Nervous, Anxious, on Edge 0 0  Control/stop worrying 0 0  Worry too much - different things 0 0  Trouble relaxing 0 0  Restless 0 0  Easily annoyed or irritable 0 0  Afraid -  awful might happen 0 0  Total GAD 7 Score 0 0  Anxiety Difficulty - Not difficult at all    BP Readings from Last 3 Encounters:  09/03/20 106/78  05/24/20 107/73  04/30/20 106/69    Physical Exam Vitals and nursing note reviewed.  Constitutional:      General: She is not in acute distress.    Appearance: She is well-developed.  HENT:     Head: Normocephalic and atraumatic.     Right Ear: Tympanic membrane and ear canal normal.     Left Ear: Tympanic membrane and ear canal normal.     Nose:     Right Sinus: No maxillary sinus tenderness.     Left Sinus: No maxillary sinus tenderness.  Eyes:     General: No scleral icterus.       Right eye: No  discharge.        Left eye: No discharge.     Conjunctiva/sclera: Conjunctivae normal.  Neck:     Thyroid: No thyromegaly.     Vascular: No carotid bruit.  Cardiovascular:     Rate and Rhythm: Normal rate and regular rhythm.     Pulses: Normal pulses.     Heart sounds: Normal heart sounds.  Pulmonary:     Effort: Pulmonary effort is normal. No respiratory distress.     Breath sounds: No wheezing.  Chest:  Breasts:     Right: No mass, nipple discharge, skin change or tenderness.     Left: No mass, nipple discharge, skin change or tenderness.    Abdominal:     General: Bowel sounds are normal.     Palpations: Abdomen is soft.     Tenderness: There is no abdominal tenderness.  Musculoskeletal:     Cervical back: Normal range of motion. No erythema.     Right lower leg: No edema.     Left lower leg: No edema.  Lymphadenopathy:     Cervical: No cervical adenopathy.  Skin:    General: Skin is warm and dry.     Capillary Refill: Capillary refill takes less than 2 seconds.     Findings: No rash.  Neurological:     General: No focal deficit present.     Mental Status: She is alert and oriented to person, place, and time.     Cranial Nerves: No cranial nerve deficit.     Sensory: No sensory deficit.     Deep Tendon Reflexes: Reflexes are normal and symmetric.  Psychiatric:        Attention and Perception: Attention normal.        Mood and Affect: Mood normal.     Wt Readings from Last 3 Encounters:  09/03/20 196 lb (88.9 kg)  05/24/20 198 lb (89.8 kg)  04/30/20 196 lb (88.9 kg)    BP 106/78   Pulse 73   Temp 98 F (36.7 C) (Oral)   Ht 5\' 6"  (1.676 m)   Wt 196 lb (88.9 kg)   LMP 06/26/2015 (Exact Date)   SpO2 100%   BMI 31.64 kg/m   Assessment and Plan: 1. Annual physical exam Normal exam except for weight. Continue exercise, dietary changes Schedule Mammogram at [facility] in December - Comprehensive metabolic panel - TSH  2. Need for hepatitis C screening  test - Hepatitis C antibody  3. Hyperlipidemia, mixed Check labs and advise - Lipid panel  4. Gastroesophageal reflux disease without esophagitis Symptoms well controlled. No red flag signs such as weight loss, n/v,  melena - CBC with Differential/Platelet  5. Tachycardia Rate controlled on bid Corgard Continue propranolol PRN  6. Constipation by delayed colonic transit Doing very well on daily Amitiza. Colonoscopy up to date.   Partially dictated using Editor, commissioning. Any errors are unintentional.  Halina Maidens, MD [facility]  09/03/2020

## 2020-09-04 ENCOUNTER — Encounter: Payer: Self-pay | Admitting: Internal Medicine

## 2020-09-04 LAB — CBC WITH DIFFERENTIAL/PLATELET
Basophils Absolute: 0.1 10*3/uL (ref 0.0–0.2)
Basos: 1 %
EOS (ABSOLUTE): 0.1 10*3/uL (ref 0.0–0.4)
Eos: 2 %
Hematocrit: 41 % (ref 34.0–46.6)
Hemoglobin: 14 g/dL (ref 11.1–15.9)
Immature Grans (Abs): 0 10*3/uL (ref 0.0–0.1)
Immature Granulocytes: 0 %
Lymphocytes Absolute: 1.8 10*3/uL (ref 0.7–3.1)
Lymphs: 43 %
MCH: 29.8 pg (ref 26.6–33.0)
MCHC: 34.1 g/dL (ref 31.5–35.7)
MCV: 87 fL (ref 79–97)
Monocytes Absolute: 0.6 10*3/uL (ref 0.1–0.9)
Monocytes: 14 %
Neutrophils Absolute: 1.7 10*3/uL (ref 1.4–7.0)
Neutrophils: 40 %
Platelets: 209 10*3/uL (ref 150–450)
RBC: 4.7 x10E6/uL (ref 3.77–5.28)
RDW: 12.5 % (ref 11.7–15.4)
WBC: 4.2 10*3/uL (ref 3.4–10.8)

## 2020-09-04 LAB — COMPREHENSIVE METABOLIC PANEL
ALT: 21 IU/L (ref 0–32)
AST: 22 IU/L (ref 0–40)
Albumin/Globulin Ratio: 2.3 — ABNORMAL HIGH (ref 1.2–2.2)
Albumin: 4.6 g/dL (ref 3.8–4.8)
Alkaline Phosphatase: 53 IU/L (ref 44–121)
BUN/Creatinine Ratio: 7 — ABNORMAL LOW (ref 9–23)
BUN: 7 mg/dL (ref 6–24)
Bilirubin Total: 0.7 mg/dL (ref 0.0–1.2)
CO2: 24 mmol/L (ref 20–29)
Calcium: 10.7 mg/dL — ABNORMAL HIGH (ref 8.7–10.2)
Chloride: 102 mmol/L (ref 96–106)
Creatinine, Ser: 0.96 mg/dL (ref 0.57–1.00)
Globulin, Total: 2 g/dL (ref 1.5–4.5)
Glucose: 103 mg/dL — ABNORMAL HIGH (ref 65–99)
Potassium: 4.6 mmol/L (ref 3.5–5.2)
Sodium: 138 mmol/L (ref 134–144)
Total Protein: 6.6 g/dL (ref 6.0–8.5)
eGFR: 72 mL/min/{1.73_m2} (ref >59)

## 2020-09-04 LAB — LIPID PANEL
Chol/HDL Ratio: 3 ratio (ref 0.0–4.4)
Cholesterol, Total: 149 mg/dL (ref 100–199)
HDL: 50 mg/dL (ref >39)
LDL Chol Calc (NIH): 86 mg/dL (ref 0–99)
Triglycerides: 64 mg/dL (ref 0–149)
VLDL Cholesterol Cal: 13 mg/dL (ref 5–40)

## 2020-09-04 LAB — HEPATITIS C ANTIBODY: Hep C Virus Ab: <0.1 s/co ratio (ref 0.0–0.9)

## 2020-09-04 LAB — TSH: TSH: 3.17 u[IU]/mL (ref 0.450–4.500)

## 2020-09-21 ENCOUNTER — Telehealth: Payer: Self-pay

## 2020-09-21 NOTE — Telephone Encounter (Signed)
Copied from [place] [id number]. Topic: General - Other >> Sep 21, 2020  8:55 AM Tessa Lerner A wrote: Reason for CRM: Patient would like to be prescribed a medication for sinus discomfort  They've been experiencing congestion as well as a runny nose.   Patient took an at home covid test, that was negative.  Patient declined to make an appt at the time of call with agent.   Please contact to further advise if needed

## 2020-09-21 NOTE — Telephone Encounter (Signed)
Pt needs an appt before any medication can be prescribed. Last visit for sinus infection was 04/2020.  KP

## 2020-09-21 NOTE — Telephone Encounter (Signed)
Pt scheduled for 09/25/20

## 2020-09-25 ENCOUNTER — Ambulatory Visit: Payer: PRIVATE HEALTH INSURANCE | Admitting: Internal Medicine

## 2020-10-05 ENCOUNTER — Other Ambulatory Visit: Payer: PRIVATE HEALTH INSURANCE

## 2020-10-05 ENCOUNTER — Other Ambulatory Visit: Payer: Self-pay

## 2020-10-06 ENCOUNTER — Encounter: Payer: Self-pay | Admitting: Internal Medicine

## 2020-10-06 LAB — CALCIUM: Calcium: 10.7 mg/dL — ABNORMAL HIGH (ref 8.7–10.2)

## 2020-10-08 ENCOUNTER — Other Ambulatory Visit: Payer: Self-pay | Admitting: Internal Medicine

## 2020-10-09 ENCOUNTER — Other Ambulatory Visit: Payer: Self-pay

## 2020-10-09 LAB — PTH, INTACT AND CALCIUM
Calcium: 10.2 mg/dL (ref 8.7–10.2)
PTH: 62 pg/mL (ref 15–65)

## 2020-10-16 ENCOUNTER — Encounter: Payer: Self-pay | Admitting: Internal Medicine

## 2020-11-16 ENCOUNTER — Other Ambulatory Visit: Payer: Self-pay | Admitting: Internal Medicine

## 2020-11-16 DIAGNOSIS — E782 Mixed hyperlipidemia: Secondary | ICD-10-CM

## 2020-12-19 ENCOUNTER — Telehealth (INDEPENDENT_AMBULATORY_CARE_PROVIDER_SITE_OTHER): Payer: PRIVATE HEALTH INSURANCE | Admitting: Internal Medicine

## 2020-12-19 ENCOUNTER — Ambulatory Visit: Payer: Self-pay

## 2020-12-19 ENCOUNTER — Encounter: Payer: Self-pay | Admitting: Internal Medicine

## 2020-12-19 ENCOUNTER — Telehealth: Payer: Self-pay

## 2020-12-19 ENCOUNTER — Other Ambulatory Visit: Payer: Self-pay

## 2020-12-19 VITALS — Temp 99.7°F | Ht 66.0 in

## 2020-12-19 DIAGNOSIS — U071 COVID-19: Secondary | ICD-10-CM | POA: Diagnosis not present

## 2020-12-19 DIAGNOSIS — R062 Wheezing: Secondary | ICD-10-CM | POA: Diagnosis not present

## 2020-12-19 DIAGNOSIS — R059 Cough, unspecified: Secondary | ICD-10-CM | POA: Diagnosis not present

## 2020-12-19 MED ORDER — PROMETHAZINE-DM 6.25-15 MG/5ML PO SYRP: 5.0000 mL | ORAL_SOLUTION | Freq: Four times a day (QID) | ORAL | 0 refills | Status: DC | PRN

## 2020-12-19 MED ORDER — ALBUTEROL SULFATE HFA 108 (90 BASE) MCG/ACT IN AERS
2.0000 | INHALATION_SPRAY | Freq: Four times a day (QID) | RESPIRATORY_TRACT | 0 refills | Status: DC | PRN
Start: 2020-12-19 — End: 2021-05-31

## 2020-12-19 NOTE — Telephone Encounter (Signed)
Pt. Started coughing Monday. Productive with yellow mucus. Had a fever last night 101.7. This morning 99.7. Has wheezing mainly at night. No shortness of breath. Home COVID test negative. Virtual visit today.

## 2020-12-19 NOTE — Telephone Encounter (Signed)

## 2020-12-19 NOTE — Telephone Encounter (Signed)
Answer Assessment - Initial Assessment Questions 1. ONSET: "When did the cough begin?"      Monday 2. SEVERITY: "How bad is the cough today?"      Severe 3. SPUTUM: "Describe the color of your sputum" (none, dry cough; clear, white, yellow, green)     Yellow 4. HEMOPTYSIS: "Are you coughing up any blood?" If so ask: "How much?" (flecks, streaks, tablespoons, etc.)     No 5. DIFFICULTY BREATHING: "Are you having difficulty breathing?" If Yes, ask: "How bad is it?" (e.g., mild, moderate, severe)    - MILD: No SOB at rest, mild SOB with walking, speaks normally in sentences, can lie down, no retractions, pulse < 100.    - MODERATE: SOB at rest, SOB with minimal exertion and prefers to sit, cannot lie down flat, speaks in phrases, mild retractions, audible wheezing, pulse 100-120.    - SEVERE: Very SOB at rest, speaks in single words, struggling to breathe, sitting hunched forward, retractions, pulse > 120      No 6. FEVER: "Do you have a fever?" If Yes, ask: "What is your temperature, how was it measured, and when did it start?"     99.7-101.7 7. CARDIAC HISTORY: "Do you have any history of heart disease?" (e.g., heart attack, congestive heart failure)      No 8. LUNG HISTORY: "Do you have any history of lung disease?"  (e.g., pulmonary embolus, asthma, emphysema)     No 9. PE RISK FACTORS: "Do you have a history of blood clots?" (or: recent major surgery, recent prolonged travel, bedridden)     No 10. OTHER SYMPTOMS: "Do you have any other symptoms?" (e.g., runny nose, wheezing, chest pain)       Wheezing 11. PREGNANCY: "Is there any chance you are pregnant?" "When was your last menstrual period?"       No 12. TRAVEL: "Have you traveled out of the country in the last month?" (e.g., travel history, exposures)       No  Protocols used: Cough - Acute Productive-A-AH

## 2020-12-19 NOTE — Progress Notes (Signed)
Date:  12/19/2020   Name:  Brittany Baldwin   DOB:  [birth year]   MRN:  [id number]  This encounter was conducted via video encounter due to the need for social distancing in light of the Covid-19 pandemic.  The patient was correctly identified.  I advised that I am conducting the visit from a secure room in my office at [facility].  The patient is located at home. The limitations of this form of encounter were discussed with the patient and he/she agreed to proceed.  Some vital signs will be absent.  Visit converted to phone visit due to blockage by her android.  Chief Complaint: Covid Positive (Tested positive today, symptoms started Monday 12/17/2020, cough with clear/yellow mucous and temp last 101.7 now 99.7 and wheezing, feels like its in chest, had chills, sore throat)  Immunization History  Administered Date(s) Administered   PFIZER(Purple Top)SARS-COV-2 Vaccination 08/29/2019, 09/21/2019, 05/28/2020    Cough This is a new problem. The current episode started yesterday. The problem has been gradually worsening. The problem occurs every few minutes. The cough is Non-productive. Associated symptoms include chills, a fever, a sore throat and wheezing. Pertinent negatives include no chest pain or headaches.   Lab Results  Component Value Date   CREATININE 0.96 09/03/2020   BUN 7 09/03/2020   NA 138 09/03/2020   K 4.6 09/03/2020   CL 102 09/03/2020   CO2 24 09/03/2020   Lab Results  Component Value Date   CHOL 149 09/03/2020   HDL 50 09/03/2020   LDLCALC 86 09/03/2020   TRIG 64 09/03/2020   CHOLHDL 3.0 09/03/2020   Lab Results  Component Value Date   TSH 3.170 09/03/2020   No results found for: HGBA1C Lab Results  Component Value Date   WBC 4.2 09/03/2020   HGB 14.0 09/03/2020   HCT 41.0 09/03/2020   MCV 87 09/03/2020   PLT 209 09/03/2020   Lab Results  Component Value Date   ALT 21 09/03/2020   AST 22 09/03/2020   ALKPHOS 53 09/03/2020   BILITOT 0.7  09/03/2020     Review of Systems  Constitutional:  Positive for chills, fatigue and fever.  HENT:  Positive for sore throat.   Respiratory:  Positive for cough and wheezing.   Cardiovascular:  Negative for chest pain and palpitations.  Gastrointestinal:  Negative for diarrhea and vomiting.  Neurological:  Negative for dizziness, light-headedness and headaches.   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 Drip in addition to other meds. Caused hallucinations   Adhesive [Tape] Rash    ELECTRODES FROM HOLTER MONITOR CAUSED RASH    Past Surgical History:  Procedure Laterality Date   ABDOMINAL HYSTERECTOMY Bilateral 07/02/2015   Procedure: HYSTERECTOMY ABDOMINAL WITH BS;  Surgeon: Brayton Mars, MD;  Location: [facility];  Service: Gynecology;  Laterality:  Bilateral;   APPENDECTOMY     BIOPSY N/A 05/24/2020   Procedure: BIOPSY;  Surgeon: Lin Landsman, MD;  Location: [place];  Service: Endoscopy;  Laterality: N/A;   CARDIAC ELECTROPHYSIOLOGY STUDY AND ABLATION     CHOLECYSTECTOMY     COLONOSCOPY  2013   COLONOSCOPY WITH PROPOFOL N/A 03/10/2017   Procedure: COLONOSCOPY WITH PROPOFOL;  Surgeon: Lin Landsman, MD;  Location: [place];  Service: Endoscopy;  Laterality: N/A;   CYSTOSCOPY  Bilateral 07/02/2015   Procedure: CYSTOSCOPY;  Surgeon: Brayton Mars, MD;  Location: [facility];  Service: Gynecology;  Laterality: Bilateral;   ESOPHAGEAL DILATION  03/10/2017   Procedure: ESOPHAGEAL DILATION;  Surgeon: Lin Landsman, MD;  Location: [place];  Service: Endoscopy;;   ESOPHAGOGASTRODUODENOSCOPY N/A 03/10/2017   Procedure: ESOPHAGOGASTRODUODENOSCOPY (EGD);  Surgeon: Lin Landsman, MD;  Location: [place];  Service: Endoscopy;  Laterality: N/A;   ESOPHAGOGASTRODUODENOSCOPY (EGD) WITH PROPOFOL N/A 07/01/2019   Procedure: ESOPHAGOGASTRODUODENOSCOPY (EGD) WITH PROPOFOL;  Surgeon: Lin Landsman, MD;  Location: [place];  Service: Endoscopy;  Laterality: N/A;   ESOPHAGOGASTRODUODENOSCOPY (EGD) WITH PROPOFOL N/A 05/24/2020   Procedure: ESOPHAGOGASTRODUODENOSCOPY (EGD) WITH PROPOFOL;  Surgeon: Lin Landsman, MD;  Location: [place];  Service: Endoscopy;  Laterality: N/A;   LAPAROTOMY N/A 07/11/2015   Procedure: hematoma evacuation;  Surgeon: Brayton Mars, MD;  Location: [facility];  Service: Gynecology;  Laterality: N/A;   NISSEN FUNDOPLICATION     TUBAL LIGATION      Social History   Tobacco Use   Smoking status: Never   Smokeless tobacco: Never  Vaping Use   Vaping Use: Never used  Substance Use Topics   Alcohol use: No   Drug use: No     Medication list has been reviewed and updated.  Current Meds  Medication Sig   atorvastatin (LIPITOR) 10 MG tablet TAKE 1 TABLET(10 MG) BY MOUTH DAILY AT 6 PM   Biotin 1 MG CAPS Take by mouth.   nadolol (CORGARD) 40 MG tablet Take 40 mg by mouth 2 (two) times daily.   propranolol (INDERAL) 20 MG tablet Take 1 tablet (20 mg total) by mouth daily as needed.    PHQ 2/9 Scores 12/19/2020 09/03/2020 04/17/2020 06/29/2019  PHQ - 2 Score 0 0 0 0  PHQ- 9 Score 0 0 0 -    GAD 7 : Generalized Anxiety Score 12/19/2020 09/03/2020 04/17/2020  Nervous, Anxious, on Edge 0 0 0   Control/stop worrying 0 0 0  Worry too much - different things 0 0 0  Trouble relaxing 0 0 0  Restless 0 0 0  Easily annoyed or irritable 0 0 0  Afraid - awful might happen 0 0 0  Total GAD 7 Score 0 0 0  Anxiety Difficulty - - Not difficult at all    BP Readings from Last 3 Encounters:  09/03/20 106/78  05/24/20 107/73  04/30/20 106/69    Physical Exam Pulmonary:     Effort: Pulmonary effort is normal.     Comments: Frequent loose cough during the call. No audible wheezing. Neurological:     Mental Status: She is alert.  Psychiatric:        Attention and Perception: Attention normal.        Mood and Affect: Mood normal.        Thought Content: Thought content normal.        Cognition and Memory: Cognition normal.    Wt  Readings from Last 3 Encounters:  09/03/20 196 lb (88.9 kg)  05/24/20 198 lb (89.8 kg)  04/30/20 196 lb (88.9 kg)    Temp 99.7 F (37.6 C) (Oral)   Ht 5\' 6"  (1.676 m)   LMP 06/26/2015 (Exact Date)   BMI 31.64 kg/m   Assessment and Plan: 1. COVID-19 Low risk of complications so will not prescribe antivirals. Supportive care, fluids, tylenol OTC for fever  2. Cough - promethazine-dextromethorphan (PROMETHAZINE-DM) 6.25-15 MG/5ML syrup; Take 5 mLs by mouth 4 (four) times daily as needed for cough.  Dispense: 118 mL; Refill: 0  3. Wheezing Use albuterol as needed Call for antibiotics if s/s of bacterial URI occur - albuterol (VENTOLIN HFA) 108 (90 Base) MCG/ACT inhaler; Inhale 2 puffs into the lungs every 6 (six) hours as needed for wheezing or shortness of breath.  Dispense: 1 each; Refill: 0  I spent 13 minutes on this encounter. Partially dictated using Editor, commissioning. Any errors are unintentional.  Halina Maidens, MD [facility]  12/19/2020

## 2020-12-23 ENCOUNTER — Encounter: Payer: Self-pay | Admitting: Internal Medicine

## 2021-01-14 ENCOUNTER — Other Ambulatory Visit: Payer: Self-pay | Admitting: Internal Medicine

## 2021-01-14 DIAGNOSIS — R000 Tachycardia, unspecified: Secondary | ICD-10-CM

## 2021-01-14 NOTE — Telephone Encounter (Signed)
  Notes to clinic:  script requested is expired  Review for continued use and refill    Requested Prescriptions  Pending Prescriptions Disp Refills   propranolol (INDERAL) 20 MG tablet [Pharmacy Med Name: PROPRANOLOL '20MG'$  TABLETS] 270 tablet     Sig: TAKE 1 TABLET BY MOUTH TWICE DAILY AS NEEDED     Cardiovascular:  Beta Blockers Passed - 01/14/2021 11:38 AM      Passed - Last BP in normal range    BP Readings from Last 1 Encounters:  09/03/20 106/78          Passed - Last Heart Rate in normal range    Pulse Readings from Last 1 Encounters:  09/03/20 73          Passed - Valid encounter within last 6 months    Recent Outpatient Visits           3 weeks ago [facility] Glean Hess, MD   4 months ago Annual physical exam   [facility] Glean Hess, MD   9 months ago Acute non-recurrent maxillary sinusitis   [facility] Glean Hess, MD   1 year ago Annual physical exam   [facility] Glean Hess, MD   1 year ago Tachycardia   [facility] Glean Hess, MD       Future Appointments             In 7 months Army Melia Jesse Sans, MD [facility], [facility]

## 2021-02-15 ENCOUNTER — Other Ambulatory Visit: Payer: Self-pay | Admitting: Internal Medicine

## 2021-02-15 DIAGNOSIS — E782 Mixed hyperlipidemia: Secondary | ICD-10-CM

## 2021-05-13 ENCOUNTER — Other Ambulatory Visit: Payer: Self-pay | Admitting: Internal Medicine

## 2021-05-13 DIAGNOSIS — E782 Mixed hyperlipidemia: Secondary | ICD-10-CM

## 2021-05-13 NOTE — Telephone Encounter (Signed)
Requested Prescriptions  Pending Prescriptions Disp Refills  . atorvastatin (LIPITOR) 10 MG tablet [Pharmacy Med Name: ATORVASTATIN 10MG  TABLETS] 90 tablet 0    Sig: TAKE 1 TABLET(10 MG) BY MOUTH DAILY AT 6 PM     Cardiovascular:  Antilipid - Statins Passed - 05/13/2021  3:32 AM      Passed - Total Cholesterol in normal range and within 360 days    Cholesterol, Total  Date Value Ref Range Status  09/03/2020 149 100 - 199 mg/dL Final         Passed - LDL in normal range and within 360 days    LDL Chol Calc (NIH)  Date Value Ref Range Status  09/03/2020 86 0 - 99 mg/dL Final         Passed - HDL in normal range and within 360 days    HDL  Date Value Ref Range Status  09/03/2020 50 >39 mg/dL Final         Passed - Triglycerides in normal range and within 360 days    Triglycerides  Date Value Ref Range Status  09/03/2020 64 0 - 149 mg/dL Final         Passed - Patient is not pregnant      Passed - Valid encounter within last 12 months    Recent Outpatient Visits          4 months ago [facility] Glean Hess, MD   8 months ago Annual physical exam   [facility] Glean Hess, MD   1 year ago Acute non-recurrent maxillary sinusitis   [facility] Glean Hess, MD   1 year ago Annual physical exam   [facility] Glean Hess, MD   2 years ago [facility] Glean Hess, MD      Future Appointments            In 3 months Army Melia Jesse Sans, MD [facility], [facility]

## 2021-05-31 ENCOUNTER — Other Ambulatory Visit: Payer: Self-pay

## 2021-05-31 ENCOUNTER — Ambulatory Visit (INDEPENDENT_AMBULATORY_CARE_PROVIDER_SITE_OTHER): Payer: PRIVATE HEALTH INSURANCE | Admitting: Internal Medicine

## 2021-05-31 ENCOUNTER — Encounter: Payer: Self-pay | Admitting: Internal Medicine

## 2021-05-31 VITALS — BP 122/70 | HR 68 | Ht 66.0 in | Wt 210.0 lb

## 2021-05-31 DIAGNOSIS — M545 Low back pain, unspecified: Secondary | ICD-10-CM | POA: Diagnosis not present

## 2021-05-31 MED ORDER — METHOCARBAMOL 500 MG PO TABS
500.0000 mg | ORAL_TABLET | Freq: Three times a day (TID) | ORAL | 0 refills | Status: DC | PRN
Start: 2021-05-31 — End: 2021-07-24

## 2021-05-31 NOTE — Progress Notes (Signed)
Date:  05/31/2021   Name:  Brittany Baldwin   DOB:  1970/10/11   MRN:  [id number]   Chief Complaint: Back Pain  Back Pain This is a new problem. The current episode started in the past 7 days. The pain is present in the lumbar spine. The pain is at a severity of 5/10. The pain is moderate. The symptoms are aggravated by standing. Stiffness is present All day. Associated symptoms include tingling. Pertinent negatives include no bladder incontinence, chest pain, dysuria, fever, headaches, leg pain, numbness or weakness. She has tried heat (and tylenol) for the symptoms. The treatment provided moderate (but then rode in the car again yesterday and now more pain and stiffness) relief. Patient set in the car driving to and from [place] and when she got out of the car she could not stand up - her back was stiff and tight.   Lab Results  Component Value Date   NA 138 09/03/2020   K 4.6 09/03/2020   CO2 24 09/03/2020   GLUCOSE 103 (H) 09/03/2020   BUN 7 09/03/2020   CREATININE 0.96 09/03/2020   CALCIUM 10.2 10/08/2020   EGFR 72 09/03/2020   GFRNONAA 70 06/29/2019   Lab Results  Component Value Date   CHOL 149 09/03/2020   HDL 50 09/03/2020   LDLCALC 86 09/03/2020   TRIG 64 09/03/2020   CHOLHDL 3.0 09/03/2020   Lab Results  Component Value Date   TSH 3.170 09/03/2020   No results found for: HGBA1C Lab Results  Component Value Date   WBC 4.2 09/03/2020   HGB 14.0 09/03/2020   HCT 41.0 09/03/2020   MCV 87 09/03/2020   PLT 209 09/03/2020   Lab Results  Component Value Date   ALT 21 09/03/2020   AST 22 09/03/2020   ALKPHOS 53 09/03/2020   BILITOT 0.7 09/03/2020   Lab Results  Component Value Date   VD25OH 35.6 01/19/2017     Review of Systems  Constitutional:  Positive for fatigue. Negative for chills and fever.  Respiratory:  Negative for chest tightness and shortness of breath.   Cardiovascular:  Positive for palpitations. Negative for chest pain and leg swelling.   Genitourinary:  Negative for bladder incontinence, difficulty urinating and dysuria.  Musculoskeletal:  Positive for back pain.  Neurological:  Positive for tingling. Negative for dizziness, weakness, numbness and headaches.   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 Drip in addition to other meds. Caused hallucinations   Adhesive [Tape] Rash    ELECTRODES FROM HOLTER MONITOR CAUSED RASH    Past Surgical History:  Procedure Laterality Date   ABDOMINAL HYSTERECTOMY Bilateral 07/02/2015   Procedure: HYSTERECTOMY ABDOMINAL WITH BS;  Surgeon: Brayton Mars, MD;  Location: [facility];  Service: Gynecology;  Laterality: Bilateral;   APPENDECTOMY     BIOPSY N/A 05/24/2020  Procedure: BIOPSY;  Surgeon: Lin Landsman, MD;  Location: [place];  Service: Endoscopy;  Laterality: N/A;   CARDIAC ELECTROPHYSIOLOGY STUDY AND ABLATION     CHOLECYSTECTOMY     COLONOSCOPY  2013   COLONOSCOPY WITH PROPOFOL N/A 03/10/2017   Procedure: COLONOSCOPY WITH PROPOFOL;  Surgeon: Lin Landsman, MD;  Location: [place];  Service: Endoscopy;  Laterality: N/A;   CYSTOSCOPY Bilateral 07/02/2015   Procedure: CYSTOSCOPY;  Surgeon: Brayton Mars, MD;  Location: [facility];  Service: Gynecology;  Laterality:  Bilateral;   ESOPHAGEAL DILATION  03/10/2017   Procedure: ESOPHAGEAL DILATION;  Surgeon: Lin Landsman, MD;  Location: [place];  Service: Endoscopy;;   ESOPHAGOGASTRODUODENOSCOPY N/A 03/10/2017   Procedure: ESOPHAGOGASTRODUODENOSCOPY (EGD);  Surgeon: Lin Landsman, MD;  Location: [place];  Service: Endoscopy;  Laterality: N/A;   ESOPHAGOGASTRODUODENOSCOPY (EGD) WITH PROPOFOL N/A 07/01/2019   Procedure: ESOPHAGOGASTRODUODENOSCOPY (EGD) WITH PROPOFOL;  Surgeon: Lin Landsman, MD;  Location: [place];  Service: Endoscopy;  Laterality: N/A;   ESOPHAGOGASTRODUODENOSCOPY (EGD) WITH PROPOFOL N/A 05/24/2020   Procedure: ESOPHAGOGASTRODUODENOSCOPY (EGD) WITH PROPOFOL;  Surgeon: Lin Landsman, MD;  Location: [place];  Service: Endoscopy;  Laterality: N/A;   LAPAROTOMY N/A 07/11/2015   Procedure: hematoma evacuation;  Surgeon: Brayton Mars, MD;  Location: [facility];  Service: Gynecology;  Laterality: N/A;   NISSEN FUNDOPLICATION     TUBAL LIGATION      Social History   Tobacco Use   Smoking status: Never   Smokeless tobacco: Never  Vaping Use   Vaping Use: Never used  Substance Use Topics   Alcohol use: No   Drug use: No     Medication list has been reviewed and updated.  Current Meds  Medication Sig   atorvastatin (LIPITOR) 10 MG tablet TAKE 1 TABLET(10 MG) BY MOUTH DAILY AT 6 PM   Cholecalciferol 50 MCG (2000 UT) CAPS Take by mouth.   methocarbamol (ROBAXIN) 500 MG tablet Take 1 tablet (500 mg total) by mouth every 8 (eight) hours as needed for muscle spasms.   Multiple Vitamins-Minerals (MULTIVITAMIN WITH MINERALS) tablet Take 1 tablet by mouth daily.   nadolol (CORGARD) 40 MG tablet Take 40 mg by mouth 2 (two) times daily.   propranolol (INDERAL) 20 MG tablet Take 1 tablet (20 mg total) by mouth as needed.    PHQ 2/9 Scores 05/31/2021 12/19/2020 09/03/2020 04/17/2020  PHQ - 2 Score 0 0 0 0  PHQ- 9 Score 1 0 0 0     GAD 7 : Generalized Anxiety Score 05/31/2021 12/19/2020 09/03/2020 04/17/2020  Nervous, Anxious, on Edge 0 0 0 0  Control/stop worrying 0 0 0 0  Worry too much - different things 0 0 0 0  Trouble relaxing 0 0 0 0  Restless 0 0 0 0  Easily annoyed or irritable 0 0 0 0  Afraid - awful might happen 0 0 0 0  Total GAD 7 Score 0 0 0 0  Anxiety Difficulty - - - Not difficult at all    BP Readings from Last 3 Encounters:  05/31/21 122/70  09/03/20 106/78  05/24/20 107/73    Physical Exam Vitals and nursing note reviewed.  Constitutional:      General: She is not in acute distress.    Appearance: She is well-developed.     Comments: Appears mildly uncomfortable  HENT:     Head: Normocephalic and atraumatic.  Cardiovascular:     Rate and  Rhythm: Normal rate and regular rhythm.  Pulmonary:     Effort: Pulmonary effort is normal. No respiratory distress.     Breath sounds: No wheezing or rhonchi.  Musculoskeletal:     Lumbar back: Spasms and tenderness present. Positive right straight leg raise test and positive left straight leg raise test.  Skin:    General: Skin is warm and dry.     Findings: No rash.  Neurological:     Mental Status: She is alert and oriented to person, place, and time.     Sensory: Sensation is intact.     Gait: Gait is intact.     Deep Tendon Reflexes:     Reflex Scores:      Patellar reflexes are 2+ on the right side and 2+ on the left side. Psychiatric:        Mood and Affect: Mood normal.        Behavior: Behavior normal.    Wt Readings from Last 3 Encounters:  05/31/21 210 lb (95.3 kg)  09/03/20 196 lb (88.9 kg)  05/24/20 198 lb (89.8 kg)    BP 122/70    Pulse 68    Ht 5' 6"  (1.676 m)    Wt 210 lb (95.3 kg)    LMP 06/26/2015 (Exact Date)    SpO2 98%    BMI 33.89 kg/m   Assessment and Plan: 1. Acute midline low back pain without sciatica Likely due to muscle spasm/strain Continue heat and Tylenol (unable to take NSAIDS) Add Robaxin every  8 hours as needed - methocarbamol (ROBAXIN) 500 MG tablet; Take 1 tablet (500 mg total) by mouth every 8 (eight) hours as needed for muscle spasms.  Dispense: 30 tablet; Refill: 0   Partially dictated using Editor, commissioning. Any errors are unintentional.  Halina Maidens, MD [facility]  05/31/2021

## 2021-06-06 ENCOUNTER — Encounter: Payer: Self-pay | Admitting: Gastroenterology

## 2021-06-06 ENCOUNTER — Ambulatory Visit (INDEPENDENT_AMBULATORY_CARE_PROVIDER_SITE_OTHER): Payer: PRIVATE HEALTH INSURANCE | Admitting: Gastroenterology

## 2021-06-06 VITALS — BP 125/75 | HR 83 | Temp 98.8°F | Ht 66.0 in | Wt 209.5 lb

## 2021-06-06 DIAGNOSIS — R140 Abdominal distension (gaseous): Secondary | ICD-10-CM | POA: Diagnosis not present

## 2021-06-06 DIAGNOSIS — R0789 Other chest pain: Secondary | ICD-10-CM

## 2021-06-06 DIAGNOSIS — R1013 Epigastric pain: Secondary | ICD-10-CM

## 2021-06-06 DIAGNOSIS — K5904 Chronic idiopathic constipation: Secondary | ICD-10-CM

## 2021-06-06 DIAGNOSIS — K219 Gastro-esophageal reflux disease without esophagitis: Secondary | ICD-10-CM | POA: Diagnosis not present

## 2021-06-06 MED ORDER — LUBIPROSTONE 24 MCG PO CAPS: 24.0000 ug | ORAL_CAPSULE | Freq: Two times a day (BID) | ORAL | 1 refills | Status: DC

## 2021-06-06 NOTE — Progress Notes (Signed)
Brittany Darby, MD [address]  [place]  [place], [place] [address]  Main: [phone]  Fax: [phone]    Gastroenterology Consultation  Referring Provider:     Glean Hess, MD Primary Care Physician:  Glean Hess, MD Primary Gastroenterologist:  None Reason for Consultation: Chronic GERD and dyspepsia        HPI:   Brittany Baldwin is a 51 y.o. African-American female referred by Dr. Army Melia, Jesse Sans, MD  for consultation & management of chronic constipation, atypical chest pain.  [name] underwent EGD with esophageal and gastric biopsies for atypical chest pain and epigastric pain.  EGD was unremarkable including biopsies.  She also reports having an coronary angiogram after the EGD which was unremarkable.  She started running, about 1 mile per day and excited that she is able to do it now.  She thinks her chest pain is getting better.  Also correlates the pain with episodes of tachycardia.  She does not think her symptoms are stress related.  She is currently taking omeprazole 40 mg 2 times a day.  She does report feeling better with regards to her constipation, taking Benefiber 3 times a day along with MiraLAX once a day She is not interested to try tricyclic antidepressant at this time.  She had questions about what is functional GI problem and I answered her concerns in today's visit   Follow-up visit 08/29/2019 Patient reports doing well with regards to her dyspepsia symptoms.  She was able to wean her off PPI.  Currently off medication for about a week and asymptomatic.  She is still struggling with constipation.  MiraLAX and high-fiber diet with fiber supplements is not helping.  She has to take Senokot to have a bowel movement.  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  She does not have any other concerns today  Follow-up visit 04/30/2020 Patient reports approximately 1 month history of  sore throat, was initially treated for sinusitis by Dr. Army Melia in mid November with Claritin, Flonase as well as azithromycin . She originally had postnasal drip, cough, chest tightness and intermittent shortness of breath in early September, went to ER, Was told to take over-the-counter  Decongestants, nasal spray as well as omeprazole. However, her sore throat persisted associated with episodes of food stuck in her lower chest.  She started taking omeprazole 20 mg daily with not much relief.  She reports gagging episodes whenever she brushes and that results in severe burning in her throat.  She denies episodes of typical regurgitation or heartburn that she had before fundoplication.  She also reports abdominal bloating, sensation of incomplete emptying despite being on Amitiza 24 MCG twice daily She denies any significant stress in her life  Follow-up visit 06/06/21 Patient is here for follow-up of chronic GERD, dyspepsia as well as chronic constipation.  With regards to chronic GERD, patient is currently off PPI.  She does have episodes of sensation of food stuck in her throat, regurgitation, burning pain in the chest.  Her last EGD on 05/24/2020 came back unremarkable except for partial fundoplication with loose wrap.  She reports frequent burping, intermittent nausea.  She also reports epigastric pain associated with abdominal bloating.  She gained few pounds.  She has stopped taking Amitiza as her insurance did not cover.  She currently has new insurance.  She takes a stool softener as needed only.    NSAIDs: None  Antiplts/Anticoagulants/Anti thrombotics: None  She denies family history of GI malignancy She works at a front desk He does not smoke or drink alcohol She is on propranolol for benign tachycardia   GI Procedures:  Upper endoscopy 05/24/2020 - Normal duodenal bulb and second portion of the duodenum. - Erythematous mucosa in the gastric body and antrum. Biopsied. - A partial  fundoplication was found. The wrap appears loose. - Esophagogastric landmarks identified. - Normal gastroesophageal junction and esophagus. Biopsied. DIAGNOSIS:  A. STOMACH; COLD BIOPSY:  - GASTRIC OXYNTIC MUCOSA WITH MILD REACTIVE FOVEOLAR HYPERPLASIA.  - NEGATIVE FOR H. PYLORI, DYSPLASIA, AND MALIGNANCY.   B. ESOPHAGUS; COLD BIOPSY:  - BENIGN SQUAMOUS MUCOSA WITH NO SIGNIFICANT HISTOPATHOLOGIC CHANGE.  - NO INCREASE IN INTRAEPITHELIAL EOSINOPHILS (LESS THAN 2 PER HPF).  - NEGATIVE FOR DYSPLASIA AND MALIGNANCY.   EGD 07/01/2019 - Normal duodenal bulb and second portion of the duodenum. - A Nissen fundoplication was found. The wrap appears intact. - Normal stomach. Biopsied. - Normal esophagus. Biopsied.   DIAGNOSIS:  A.  STOMACH; COLD BIOPSY:  - OXYNTIC MUCOSA WITHOUT PATHOLOGIC CHANGES.  - NEGATIVE FOR H. PYLORI, INTESTINAL METAPLASIA, DYSPLASIA, AND  MALIGNANCY.   B.  ESOPHAGUS, DISTAL; COLD BIOPSY:  - STRATIFIED SQUAMOUS EPITHELIUM WITHOUT EOSINOPHILS, NEUTROPHILS, OR  REACTIVE CHANGES.  - EXTREMELY SCANT TINY STRIPS OF COLUMNAR EPITHELIUM.  - NEGATIVE FOR GOBLET CELLS, DYSPLASIA, AND MALIGNANCY.   C.  ESOPHAGUS, PROXIMAL; COLD BIOPSY:  - STRATIFIED SQUAMOUS EPITHELIUM WITHOUT EOSINOPHILS, NEUTROPHILS, OR  REACTIVE CHANGES.  - NEGATIVE FOR DYSPLASIA AND MALIGNANCY.  EGD and colonoscopy 03/10/2017 - Normal duodenal bulb and second portion of the duodenum. - A Nissen fundoplication was found. The wrap appears intact. Dilated to 18 mm. - Normal gastroesophageal junction and esophagus. - No specimens collected.  - Non-bleeding internal hemorrhoids. - The entire examined colon is normal. - No specimens collected.  EGD on 06/05/2014 A. GASTRIC ANTRUM (ENDOSCOPIC BIOPSY):  GASTRIC ANTRAL MUCOSA WITH REACTIVE FOVEOLAR HYPERPLASIA AND MILD CHRONIC GASTRITIS. NO ACTIVE GASTRITIS IS SEEN. IMMUNOHISTOCHEMISTRY FOR HELICOBACTER PYLORI IS NEGATIVE. B. ESOPHAGUS AT  GASTROESOPHAGEAL JUNCTION (ENDOSCOPIC BIOPSY):  SQUAMOUS MUCOSA WITH NO PATHOLOGIC ABNORMALITY. NO EVIDENCE OF REFLUX OR EOSINOPHILIC ESOPHAGITIS IS SEEN. NEGATIVE FOR INTESTINAL METAPLASIA, DYSPLASIA OR MALIGNANCY. ----------------------------------------------------------------------------------------------------------- pH Bravo Testing Results 06/08/2014 at [facility]  Results of 48 hour pH monitoring by way of an endoscopically  placed pH Bravo probe revealed: - NO Significant presence of acid as evidenced by pH lower than 4  throughout the monitoring period (DeMeester score of 1.4 & 2.1 on  days 1 & 2 respectively) - Given the above and recent workup demonstrating intact Nissen  Fundoplication, No additional reflux testing/interventions  Recommended. ------------------------------------------------------------------------------------------------ NM gastric emptying study 12/22/2014  Findings : The stomach is located normally in the left upper abdomen.  Solid gastric emptying at   60 minutes is 48% (normal range at   60 minutes is >10%). Solid gastric emptying at 120 minutes is 69% (normal range at 120 minutes is >40%). Solid gastric emptying at 244 minutes is 97% (normal range at 240 minutes is >90%).   Past Medical History:  Diagnosis Date   Anemia    H/O    Anxiety    Dysrhythmia    SVT-HAD CARDIAC ABLATION DONE IN 2016 BUT PT STATES SHE STILL GETS INTERMITTENT TACHYCARDIC AND IS SYPMPTOMATIC WITH SOB, DIZZINESS DURING TACHY EPISODES (06-25-15)   Elevated parathyroid hormone 07/03/2016   Fibroid    GERD (gastroesophageal reflux disease)    Headache  H/O   Heart murmur    as child   History of fundoplication 7/90/2409   History of pulmonary embolus during pregnancy 05/16/2015   2001   Hoarseness, persistent 10/06/2017   Hypercalcemia 01/18/2017   PTH, PTHrP, 1,25 vit D and 25 vit D, Thyroid and Serum free light chains - normal  Repeat Calcium WNL   Hyperlipemia    Motion  sickness    cars, boats   PE (pulmonary thromboembolism) ([place]) 2000   while on bed rest during pregnancy   PONV (postoperative nausea and vomiting)    Shortness of breath dyspnea    ASSOCIATED WITH TACHYCARDIA   Status post abdominal hysterectomy 07/02/2015   Status post TAH, bilateral salpingectomy. PATHOLOGY: Uterine fibroids and adenomyosis    Tachycardia     Past Surgical History:  Procedure Laterality Date   ABDOMINAL HYSTERECTOMY Bilateral 07/02/2015   Procedure: HYSTERECTOMY ABDOMINAL WITH BS;  Surgeon: Brayton Mars, MD;  Location: [facility];  Service: Gynecology;  Laterality: Bilateral;   APPENDECTOMY     BIOPSY N/A 05/24/2020   Procedure: BIOPSY;  Surgeon: Lin Landsman, MD;  Location: [place];  Service: Endoscopy;  Laterality: N/A;   CARDIAC ELECTROPHYSIOLOGY STUDY AND ABLATION     CHOLECYSTECTOMY     COLONOSCOPY  2013   COLONOSCOPY WITH PROPOFOL N/A 03/10/2017   Procedure: COLONOSCOPY WITH PROPOFOL;  Surgeon: Lin Landsman, MD;  Location: [place];  Service: Endoscopy;  Laterality: N/A;   CYSTOSCOPY Bilateral 07/02/2015   Procedure: CYSTOSCOPY;  Surgeon: Brayton Mars, MD;  Location: [facility];  Service: Gynecology;  Laterality: Bilateral;   ESOPHAGEAL DILATION  03/10/2017   Procedure: ESOPHAGEAL DILATION;  Surgeon: Lin Landsman, MD;  Location: [place];  Service: Endoscopy;;   ESOPHAGOGASTRODUODENOSCOPY N/A 03/10/2017   Procedure: ESOPHAGOGASTRODUODENOSCOPY (EGD);  Surgeon: Lin Landsman, MD;  Location: [place];  Service: Endoscopy;  Laterality: N/A;   ESOPHAGOGASTRODUODENOSCOPY (EGD) WITH PROPOFOL N/A 07/01/2019   Procedure: ESOPHAGOGASTRODUODENOSCOPY (EGD) WITH PROPOFOL;  Surgeon: Lin Landsman, MD;  Location: [place];  Service: Endoscopy;  Laterality: N/A;   ESOPHAGOGASTRODUODENOSCOPY (EGD) WITH PROPOFOL N/A 05/24/2020   Procedure: ESOPHAGOGASTRODUODENOSCOPY (EGD) WITH  PROPOFOL;  Surgeon: Lin Landsman, MD;  Location: [place];  Service: Endoscopy;  Laterality: N/A;   LAPAROTOMY N/A 07/11/2015   Procedure: hematoma evacuation;  Surgeon: Brayton Mars, MD;  Location: [facility];  Service: Gynecology;  Laterality: N/A;   NISSEN FUNDOPLICATION     TUBAL LIGATION     Current Outpatient Medications:    atorvastatin (LIPITOR) 10 MG tablet, TAKE 1 TABLET(10 MG) BY MOUTH DAILY AT 6 PM, Disp: 90 tablet, Rfl: 0   Cholecalciferol 50 MCG (2000 UT) CAPS, Take by mouth., Disp: , Rfl:    lubiprostone (AMITIZA) 24 MCG capsule, Take 1 capsule (24 mcg total) by mouth 2 (two) times daily with a meal., Disp: 60 capsule, Rfl: 1   methocarbamol (ROBAXIN) 500 MG tablet, Take 1 tablet (500 mg total) by mouth every 8 (eight) hours as needed for muscle spasms., Disp: 30 tablet, Rfl: 0   Multiple Vitamins-Minerals (MULTIVITAMIN WITH MINERALS) tablet, Take 1 tablet by mouth daily., Disp: , Rfl:    nadolol (CORGARD) 40 MG tablet, Take 40 mg by mouth 2 (two) times daily., Disp: , Rfl:    propranolol (INDERAL) 20 MG tablet, Take 1 tablet (20 mg total) by mouth as needed., Disp: 90 tablet, Rfl: 1    Family History  Problem Relation Age  of Onset   Diabetes Mother    Prostate cancer Father    Congestive Heart Failure Father    Heart failure Father    Cancer Neg Hx      Social History   Tobacco Use   Smoking status: Never   Smokeless tobacco: Never  Vaping Use   Vaping Use: Never used  Substance Use Topics   Alcohol use: No   Drug use: No    Allergies as of 06/06/2021 - Review Complete 06/06/2021  Allergen Reaction Noted   Codeine Shortness Of Breath 05/09/2015   Hydrocodone-acetaminophen Rash 05/09/2015   Aspirin Other (See Comments) 05/09/2015   Diphenhydramine hcl Other (See Comments) 05/09/2015   Adhesive [tape] Rash 06/25/2015    Review of Systems:    All systems reviewed and negative except where noted in HPI.   Physical Exam:  BP 125/75  (BP Location: Left Arm, Patient Position: Sitting, Cuff Size: Normal)    Pulse 83    Temp 98.8 F (37.1 C) (Oral)    Ht 5\' 6"  (1.676 m)    Wt 209 lb 8 oz (95 kg)    LMP 06/26/2015 (Exact Date)    BMI 33.81 kg/m  Patient's last menstrual period was 06/26/2015 (exact date).  General:   Alert,  Well-developed, well-nourished, pleasant and cooperative in NAD Head:  Normocephalic and atraumatic. Eyes:  Sclera clear, no icterus.   Conjunctiva pink. Ears:  Normal auditory acuity. Nose:  No deformity, discharge, or lesions. Mouth:  No deformity or lesions,oropharynx pink & moist. Neck:  Supple; no masses or thyromegaly. Abdomen:  Normal bowel sounds.  No bruits.  Soft, nontender and non-distended without masses, hepatosplenomegaly or hernias noted.  No guarding or rebound tenderness.   Rectal: Nor performed Msk:  Symmetrical without gross deformities. Good, equal movement & strength bilaterally. Pulses:  Normal pulses noted. Extremities:  No clubbing or edema.  No cyanosis. Neurologic:  Alert and oriented x3;  grossly normal neurologically. Skin:  Intact without significant lesions or rashes. No jaundice. Psych:  Alert and cooperative. Normal mood and affect.  Imaging Studies: Reviewed  Assessment and Plan:   Brittany Baldwin is a 51 y.o. African American female with history of refractory GERD status post Nissen's fundoplication for follow-up of chronic GERD, atypical chest pain, dyspepsia, chronic constipation   Chronic GERD, atypical chest pain Patient was weaned off PPI previously Extensive work-up has been done in the past which is negative to date EGD unremarkable including esophageal biopsies Recommend pH impedance study as well as esophageal manometry to assess the need for redo fundoplication, will refer to [facility]  Dyspepsia EGD unremarkable Likely functional dyspepsia   Chronic constipation: Ongoing issue Continue high-fiber diet, fiber supplements Refill Amitiza,  continue 24 MCG twice daily Encouraged regular physical activity   Colon cancer screening: Repeat in 03/2027    Follow up via MyChart in 2 to 3 weeks  Brittany Darby, MD

## 2021-06-13 ENCOUNTER — Encounter: Payer: Self-pay | Admitting: Gastroenterology

## 2021-06-13 ENCOUNTER — Encounter: Payer: Self-pay | Admitting: Internal Medicine

## 2021-06-13 MED ORDER — LINACLOTIDE 145 MCG PO CAPS: 145.0000 ug | ORAL_CAPSULE | Freq: Every day | ORAL | 1 refills | Status: DC

## 2021-06-13 MED ORDER — GOLYTELY 236 G PO SOLR: 4000.0000 mL | Freq: Once | ORAL | 0 refills | Status: AC

## 2021-06-13 NOTE — Telephone Encounter (Signed)
Sent medications to the pharmacy

## 2021-06-14 ENCOUNTER — Ambulatory Visit (INDEPENDENT_AMBULATORY_CARE_PROVIDER_SITE_OTHER): Payer: PRIVATE HEALTH INSURANCE | Admitting: Family Medicine

## 2021-06-14 ENCOUNTER — Encounter: Payer: Self-pay | Admitting: Family Medicine

## 2021-06-14 ENCOUNTER — Other Ambulatory Visit: Payer: Self-pay

## 2021-06-14 ENCOUNTER — Ambulatory Visit
Admission: RE | Admit: 2021-06-14 | Discharge: 2021-06-14 | Disposition: A | Discharge status: Home / Self Care | Payer: PRIVATE HEALTH INSURANCE | Attending: Family Medicine | Admitting: Family Medicine

## 2021-06-14 ENCOUNTER — Ambulatory Visit
Admission: RE | Admit: 2021-06-14 | Discharge: 2021-06-14 | Disposition: A | Discharge status: Home / Self Care | Payer: PRIVATE HEALTH INSURANCE | Source: Ambulatory Visit | Attending: Family Medicine | Admitting: Family Medicine

## 2021-06-14 VITALS — BP 106/78 | HR 80 | Ht 66.0 in | Wt 211.0 lb

## 2021-06-14 DIAGNOSIS — M47817 Spondylosis without myelopathy or radiculopathy, lumbosacral region: Secondary | ICD-10-CM | POA: Insufficient documentation

## 2021-06-14 MED ORDER — DICLOFENAC SODIUM 50 MG PO TBEC: 50.0000 mg | DELAYED_RELEASE_TABLET | Freq: Two times a day (BID) | ORAL | 0 refills | Status: DC

## 2021-06-14 NOTE — Progress Notes (Signed)
°  ° °  Primary Care / Sports Medicine Office Visit  Patient Information:  Patient ID: Milyn Stapleton, female DOB: 1970/11/19 Age: 51 y.o. MRN: [id number]   Willella Harding is a pleasant 51 y.o. female presenting with the following:  Chief Complaint  Patient presents with   New Patient (Initial Visit)   Back Pain    Lower; since 05/27/21 after doing bending down to do daughter's hair for hours and driving for a long period of time between states ([place] to [place] round trip); pain when bending over, standing for prolonged periods of time; describes pain as throbbing, intermittent, and sharp; treatments include heat, Tylenol, and methocarbamol with no relief; 8/10 pain    Vitals:   06/14/21 1516  BP: 106/78  Pulse: 80  SpO2: 99%   Vitals:   06/14/21 1516  Weight: 211 lb (95.7 kg)  Height: 5\' 6"  (1.676 m)   Body mass index is 34.06 kg/m.  No results found.   Independent interpretation of notes and tests performed by another provider:   None  Procedures performed:   None  Pertinent History, Exam, Impression, and Recommendations:   Spondylosis of 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 Patient presents with nearly 69-month history of acute exacerbation of low back pain, she does give a history of chronicity with similar, though less severe, episodes in the past.  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  Denies any symptoms radiating down the legs, no paresthesias, no weakness, pain with standing upright, which she describes extension, somewhat alleviated by heat, Tylenol, did trial skeletal muscle relaxer without significant response.  Examination reveals patient ambulating in a slightly forward flexed position at the hips, axial loading during maximal extension of lumbar spine recreates pain left greater than right, left greater than right SI joint tenderness, mild tightness throughout the hamstrings during passive  motion, otherwise negative straight leg raise, equivocal FABER bilaterally, negative FADIR.  She is nontender about the greater trochanteric regions bilaterally, gluteus medius distribution and piriformis.  Patient's stated history of chronicity, mechanism of onset raise concern for facet arthropathy related arthralgia of the lumbosacral spine.  I have advised patient of the same as well as treatment strategies.  Plan for scheduled anti-inflammatory for 2 weeks, transition to as needed, home-based rehab with a focus on gentle mobilization, relative rest and activity modification, and close follow-up after x-rays.  If suboptimal response noted at return, can discuss escalation of pharmacotherapy, formal physical therapy, advanced imaging as options.  If improved/improving, with medication, gradual return to full activity, finish out home-based rehab.   Orders & Medications Meds ordered this encounter  Medications   diclofenac (VOLTAREN) 50 MG EC tablet    Sig: Take 1 tablet (50 mg total) by mouth 2 (two) times daily.    Dispense:  60 tablet    Refill:  0   Orders Placed This Encounter  Procedures   DG Lumbar Spine Complete     Return in about 4 weeks (around 07/12/2021).     Montel Culver, MD   Primary Care Sports Medicine [place]

## 2021-06-14 NOTE — Patient Instructions (Signed)
-   Obtain x-rays downstairs today - Start diclofenac every a.m. and every p.m. with food x2 weeks - After 2 weeks dose twice daily on an as-needed basis (take with food) - Start home exercises in 1 week, focus on gentle progression using low back symptoms as a guide - Hold from activity outside of daily tasks of living next-return for follow-up in 4 weeks

## 2021-06-14 NOTE — Assessment & Plan Note (Signed)
Patient presents with nearly 57-month history of acute exacerbation of low back pain, she does give a history of chronicity with similar, though less severe, episodes in the past.  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  Denies any symptoms radiating down the legs, no paresthesias, no weakness, pain with standing upright, which she describes extension, somewhat alleviated by heat, Tylenol, did trial skeletal muscle relaxer without significant response.  Examination reveals patient ambulating in a slightly forward flexed position at the hips, axial loading during maximal extension of lumbar spine recreates pain left greater than right, left greater than right SI joint tenderness, mild tightness throughout the hamstrings during passive motion, otherwise negative straight leg raise, equivocal FABER bilaterally, negative FADIR.  She is nontender about the greater trochanteric regions bilaterally, gluteus medius distribution and piriformis.  Patient's stated history of chronicity, mechanism of onset raise concern for facet arthropathy related arthralgia of the lumbosacral spine.  I have advised patient of the same as well as treatment strategies.  Plan for scheduled anti-inflammatory for 2 weeks, transition to as needed, home-based rehab with a focus on gentle mobilization, relative rest and activity modification, and close follow-up after x-rays.  If suboptimal response noted at return, can discuss escalation of pharmacotherapy, formal physical therapy, advanced imaging as options.  If improved/improving, with medication, gradual return to full activity, finish out home-based rehab.

## 2021-06-16 ENCOUNTER — Encounter: Payer: Self-pay | Admitting: Family Medicine

## 2021-06-17 ENCOUNTER — Encounter: Payer: Self-pay | Admitting: Family Medicine

## 2021-06-24 ENCOUNTER — Encounter: Payer: Self-pay | Admitting: Gastroenterology

## 2021-07-04 ENCOUNTER — Other Ambulatory Visit: Payer: Self-pay

## 2021-07-04 ENCOUNTER — Encounter: Payer: Self-pay | Admitting: Gastroenterology

## 2021-07-04 ENCOUNTER — Encounter: Payer: Self-pay | Admitting: Internal Medicine

## 2021-07-04 ENCOUNTER — Ambulatory Visit (INDEPENDENT_AMBULATORY_CARE_PROVIDER_SITE_OTHER): Payer: PRIVATE HEALTH INSURANCE | Admitting: Internal Medicine

## 2021-07-04 VITALS — BP 106/72 | HR 74 | Ht 66.0 in | Wt 210.0 lb

## 2021-07-04 DIAGNOSIS — B009 Herpesviral infection, unspecified: Secondary | ICD-10-CM | POA: Diagnosis not present

## 2021-07-04 DIAGNOSIS — K219 Gastro-esophageal reflux disease without esophagitis: Secondary | ICD-10-CM

## 2021-07-04 DIAGNOSIS — R140 Abdominal distension (gaseous): Secondary | ICD-10-CM

## 2021-07-04 DIAGNOSIS — K5904 Chronic idiopathic constipation: Secondary | ICD-10-CM

## 2021-07-04 MED ORDER — VALACYCLOVIR HCL 1 G PO TABS: 2000.0000 mg | ORAL_TABLET | Freq: Two times a day (BID) | ORAL | 0 refills | Status: DC

## 2021-07-04 NOTE — Progress Notes (Signed)
Date:  07/04/2021   Name:  Brittany Baldwin   DOB:  11/22/1970   MRN:  [id number]   Chief Complaint: Mouth Lesions (X 2 days, painful, small swelling, cluster, on top lip )  Mouth Lesions  The current episode started 2 days ago. The onset was gradual. The problem occurs rarely. The problem has been gradually worsening. The problem is mild. Nothing relieves the symptoms. Associated symptoms include mouth sores. Pertinent negatives include no fever and no cough. She has been Eating and drinking normally. Urine output has been normal. The last void occurred Less than 6 hours ago. There were no sick contacts.  She had a few fever blisters in childhood but none recently.  No hx of genital lesions. Her husband also has hx of fever blisters.  Lab Results  Component Value Date   NA 138 09/03/2020   K 4.6 09/03/2020   CO2 24 09/03/2020   GLUCOSE 103 (H) 09/03/2020   BUN 7 09/03/2020   CREATININE 0.96 09/03/2020   CALCIUM 10.2 10/08/2020   EGFR 72 09/03/2020   GFRNONAA 70 06/29/2019   Lab Results  Component Value Date   CHOL 149 09/03/2020   HDL 50 09/03/2020   LDLCALC 86 09/03/2020   TRIG 64 09/03/2020   CHOLHDL 3.0 09/03/2020   Lab Results  Component Value Date   TSH 3.170 09/03/2020   No results found for: HGBA1C Lab Results  Component Value Date   WBC 4.2 09/03/2020   HGB 14.0 09/03/2020   HCT 41.0 09/03/2020   MCV 87 09/03/2020   PLT 209 09/03/2020   Lab Results  Component Value Date   ALT 21 09/03/2020   AST 22 09/03/2020   ALKPHOS 53 09/03/2020   BILITOT 0.7 09/03/2020   Lab Results  Component Value Date   VD25OH 35.6 01/19/2017     Review of Systems  Constitutional:  Negative for chills, fatigue and fever.  HENT:  Positive for mouth sores.   Respiratory:  Negative for cough, chest tightness and shortness of breath.   Cardiovascular:  Negative for chest pain.   Patient Active Problem List   Diagnosis Date Noted   HSV-1 (herpes simplex virus 1) infection  07/04/2021   Spondylosis of 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 06/14/2021   Osteophyte of left hip 04/19/2020   Acanthosis nigricans 07/30/2018   Esophageal dysphagia    Functional dyspepsia 02/20/2017   Dependent edema 08/05/2016   Plantar fasciitis, bilateral 08/05/2016   Constipation by delayed colonic transit 08/05/2016   Vocal cord nodules 02/13/2016   S/P total hysterectomy 07/02/2015   Tachycardia 05/10/2015   Gastroesophageal reflux disease without esophagitis 03/28/2014   Hyperlipidemia, mixed 03/28/2014    Allergies  Allergen Reactions   Codeine Shortness Of Breath   Hydrocodone-Acetaminophen Rash   Aspirin Other (See Comments)    Stomach issues   Diphenhydramine Hcl Other (See Comments)    Through an I.V. Drip in addition to other meds. Caused hallucinations   Adhesive [Tape] Rash    ELECTRODES FROM HOLTER MONITOR CAUSED RASH    Past Surgical History:  Procedure Laterality Date   ABDOMINAL HYSTERECTOMY Bilateral 07/02/2015   Procedure: HYSTERECTOMY ABDOMINAL WITH BS;  Surgeon: Brayton Mars, MD;  Location: [facility];  Service: Gynecology;  Laterality: Bilateral;   APPENDECTOMY     BIOPSY N/A 05/24/2020   Procedure: BIOPSY;  Surgeon: Lin Landsman, MD;  Location: [place];  Service: Endoscopy;  Laterality: N/A;   CARDIAC ELECTROPHYSIOLOGY STUDY AND ABLATION  CHOLECYSTECTOMY     COLONOSCOPY  2013   COLONOSCOPY WITH PROPOFOL N/A 03/10/2017   Procedure: COLONOSCOPY WITH PROPOFOL;  Surgeon: Lin Landsman, MD;  Location: [place];  Service: Endoscopy;  Laterality: N/A;   CYSTOSCOPY Bilateral 07/02/2015   Procedure: CYSTOSCOPY;  Surgeon: Brayton Mars, MD;  Location: [facility];  Service: Gynecology;  Laterality: Bilateral;   ESOPHAGEAL DILATION  03/10/2017   Procedure: ESOPHAGEAL DILATION;  Surgeon: Lin Landsman, MD;  Location: [place];  Service: Endoscopy;;   ESOPHAGOGASTRODUODENOSCOPY  N/A 03/10/2017   Procedure: ESOPHAGOGASTRODUODENOSCOPY (EGD);  Surgeon: Lin Landsman, MD;  Location: [place];  Service: Endoscopy;  Laterality: N/A;   ESOPHAGOGASTRODUODENOSCOPY (EGD) WITH PROPOFOL N/A 07/01/2019   Procedure: ESOPHAGOGASTRODUODENOSCOPY (EGD) WITH PROPOFOL;  Surgeon: Lin Landsman, MD;  Location: [place];  Service: Endoscopy;  Laterality: N/A;   ESOPHAGOGASTRODUODENOSCOPY (EGD) WITH PROPOFOL N/A 05/24/2020   Procedure: ESOPHAGOGASTRODUODENOSCOPY (EGD) WITH PROPOFOL;  Surgeon: Lin Landsman, MD;  Location: [place];  Service: Endoscopy;  Laterality: N/A;   LAPAROTOMY N/A 07/11/2015   Procedure: hematoma evacuation;  Surgeon: Brayton Mars, MD;  Location: [facility];  Service: Gynecology;  Laterality: N/A;   NISSEN FUNDOPLICATION     TUBAL LIGATION      Social History   Tobacco Use   Smoking status: Never   Smokeless tobacco: Never  Vaping Use   Vaping Use: Never used  Substance Use Topics   Alcohol use: Not Currently   Drug use: Never     Medication list has been reviewed and updated.  Current Meds  Medication Sig   atorvastatin (LIPITOR) 10 MG tablet TAKE 1 TABLET(10 MG) BY MOUTH DAILY AT 6 PM   Cholecalciferol 50 MCG (2000 UT) CAPS Take 2,000 Units by mouth daily.   diclofenac (VOLTAREN) 50 MG EC tablet Take 1 tablet (50 mg total) by mouth 2 (two) times daily.   linaclotide (LINZESS) 145 MCG CAPS capsule Take 1 capsule (145 mcg total) by mouth daily before breakfast. (Patient taking differently: Take 145 mcg by mouth as needed.)   methocarbamol (ROBAXIN) 500 MG tablet Take 1 tablet (500 mg total) by mouth every 8 (eight) hours as needed for muscle spasms.   Methylcobalamin (B12-ACTIVE PO) Take 1 tablet by mouth daily.   Multiple Vitamins-Minerals (MULTIVITAMIN WITH MINERALS) tablet Take 1 tablet by mouth daily.   nadolol (CORGARD) 40 MG tablet Take 40 mg by mouth 2 (two) times daily.   propranolol (INDERAL)  20 MG tablet Take 1 tablet (20 mg total) by mouth as needed.   valACYclovir (VALTREX) 1000 MG tablet Take 2 tablets (2,000 mg total) by mouth 2 (two) times daily.   [DISCONTINUED] GAVILYTE-G 236 g solution Take 4,000 mLs by mouth as directed.    PHQ 2/9 Scores 07/04/2021 06/14/2021 05/31/2021 12/19/2020  PHQ - 2 Score 0 0 0 0  PHQ- 9 Score 0 0 1 0    GAD 7 : Generalized Anxiety Score 07/04/2021 06/14/2021 05/31/2021 12/19/2020  Nervous, Anxious, on Edge 0 0 0 0  Control/stop worrying 0 0 0 0  Worry too much - different things 0 0 0 0  Trouble relaxing 0 0 0 0  Restless 0 0 0 0  Easily annoyed or irritable 0 0 0 0  Afraid - awful might happen 0 0 0 0  Total GAD 7 Score 0 0 0 0  Anxiety Difficulty Not difficult at all Not difficult at all - -    BP Readings  from Last 3 Encounters:  07/04/21 106/72  06/14/21 106/78  06/06/21 125/75    Physical Exam Vitals and nursing note reviewed.  Constitutional:      General: She is not in acute distress.    Appearance: She is well-developed.  HENT:     Head: Normocephalic and atraumatic.     Mouth/Throat:      Comments: Cluster of vesicle with some swelling of the mid upper lip Pulmonary:     Effort: Pulmonary effort is normal. No respiratory distress.  Skin:    General: Skin is warm and dry.     Findings: No rash.  Neurological:     Mental Status: She is alert and oriented to person, place, and time.  Psychiatric:        Mood and Affect: Mood normal.        Behavior: Behavior normal.    Wt Readings from Last 3 Encounters:  07/04/21 210 lb (95.3 kg)  06/14/21 211 lb (95.7 kg)  06/06/21 209 lb 8 oz (95 kg)    BP 106/72    Pulse 74    Ht _0  (1.676 m)    Wt 210 lb (95.3 kg)    LMP 06/26/2015 (Exact Date)    SpO2 99%    BMI 33.89 kg/m   Assessment and Plan: 1. HSV-1 (herpes simplex virus 1) infection Take Valtrex 2000 mg bid for 2 doses as needed Use vaseline topically for comfort - valACYclovir (VALTREX) 1000 MG tablet; Take 2  tablets (2,000 mg total) by mouth 2 (two) times daily.  Dispense: 12 tablet; Refill: 0   Partially dictated using Editor, commissioning. Any errors are unintentional.  Halina Maidens, MD [facility]  07/04/2021

## 2021-07-05 NOTE — Telephone Encounter (Signed)
We order PH/ [facility] 24 hour off PPI

## 2021-07-09 ENCOUNTER — Encounter: Payer: Self-pay | Admitting: Internal Medicine

## 2021-07-11 ENCOUNTER — Ambulatory Visit (INDEPENDENT_AMBULATORY_CARE_PROVIDER_SITE_OTHER): Payer: PRIVATE HEALTH INSURANCE | Admitting: Internal Medicine

## 2021-07-11 ENCOUNTER — Other Ambulatory Visit: Payer: Self-pay | Admitting: Family Medicine

## 2021-07-11 ENCOUNTER — Encounter: Payer: Self-pay | Admitting: Internal Medicine

## 2021-07-11 ENCOUNTER — Telehealth: Payer: Self-pay | Admitting: Gastroenterology

## 2021-07-11 ENCOUNTER — Other Ambulatory Visit: Payer: Self-pay

## 2021-07-11 VITALS — BP 104/64 | HR 82 | Temp 98.0°F | Ht 66.0 in | Wt 211.0 lb

## 2021-07-11 DIAGNOSIS — J029 Acute pharyngitis, unspecified: Secondary | ICD-10-CM | POA: Diagnosis not present

## 2021-07-11 DIAGNOSIS — M47817 Spondylosis without myelopathy or radiculopathy, lumbosacral region: Secondary | ICD-10-CM

## 2021-07-11 MED ORDER — AZITHROMYCIN 250 MG PO TABS: ORAL_TABLET | ORAL | 0 refills | Status: AC

## 2021-07-11 NOTE — Progress Notes (Signed)
Date:  07/11/2021   Name:  Brittany Baldwin   DOB:  07/13/1970   MRN:  [id number]   Chief Complaint: Sore Throat  Sore Throat  This is a new problem. The current episode started in the past 7 days (2 days ago). The problem has been gradually worsening. There has been no fever. Associated symptoms include coughing, neck pain and trouble swallowing. Pertinent negatives include no ear pain. Tested herself for Covid and it was Negative.  Lab Results  Component Value Date   NA 138 09/03/2020   K 4.6 09/03/2020   CO2 24 09/03/2020   GLUCOSE 103 (H) 09/03/2020   BUN 7 09/03/2020   CREATININE 0.96 09/03/2020   CALCIUM 10.2 10/08/2020   EGFR 72 09/03/2020   GFRNONAA 70 06/29/2019   Lab Results  Component Value Date   CHOL 149 09/03/2020   HDL 50 09/03/2020   LDLCALC 86 09/03/2020   TRIG 64 09/03/2020   CHOLHDL 3.0 09/03/2020   Lab Results  Component Value Date   TSH 3.170 09/03/2020   No results found for: HGBA1C Lab Results  Component Value Date   WBC 4.2 09/03/2020   HGB 14.0 09/03/2020   HCT 41.0 09/03/2020   MCV 87 09/03/2020   PLT 209 09/03/2020   Lab Results  Component Value Date   ALT 21 09/03/2020   AST 22 09/03/2020   ALKPHOS 53 09/03/2020   BILITOT 0.7 09/03/2020   Lab Results  Component Value Date   VD25OH 35.6 01/19/2017     Review of Systems  Constitutional:  Negative for chills, fatigue and fever.  HENT:  Positive for sore throat and trouble swallowing. Negative for dental problem, ear pain and sinus pressure.   Respiratory:  Positive for cough. Negative for chest tightness and wheezing.   Cardiovascular:  Negative for chest pain and palpitations.  Musculoskeletal:  Positive for neck pain.   Patient Active Problem List   Diagnosis Date Noted   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   Spondylosis of 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 06/14/2021   Osteophyte of left hip 04/19/2020   Acanthosis nigricans 07/30/2018    Esophageal dysphagia    Functional dyspepsia 02/20/2017   Dependent edema 08/05/2016   Plantar fasciitis, bilateral 08/05/2016   Constipation by delayed colonic transit 08/05/2016   Vocal cord nodules 02/13/2016   S/P total hysterectomy 07/02/2015   Tachycardia 05/10/2015   Gastroesophageal reflux disease without esophagitis 03/28/2014   Hyperlipidemia, mixed 03/28/2014    Allergies  Allergen Reactions   Codeine Shortness Of Breath   Hydrocodone-Acetaminophen Rash   Aspirin Other (See Comments)    Stomach issues   Diphenhydramine Hcl Other (See Comments)    Through an I.V. Drip in addition to other meds. Caused hallucinations   Adhesive [Tape] Rash    ELECTRODES FROM HOLTER MONITOR CAUSED RASH    Past Surgical History:  Procedure Laterality Date   ABDOMINAL HYSTERECTOMY Bilateral 07/02/2015   Procedure: HYSTERECTOMY ABDOMINAL WITH BS;  Surgeon: Brayton Mars, MD;  Location: [facility];  Service: Gynecology;  Laterality: Bilateral;   APPENDECTOMY     BIOPSY N/A 05/24/2020   Procedure: BIOPSY;  Surgeon: Lin Landsman, MD;  Location: [place];  Service: Endoscopy;  Laterality: N/A;   CARDIAC ELECTROPHYSIOLOGY STUDY AND ABLATION     CHOLECYSTECTOMY     COLONOSCOPY  2013   COLONOSCOPY WITH PROPOFOL N/A 03/10/2017   Procedure: COLONOSCOPY WITH PROPOFOL;  Surgeon: Lin Landsman, MD;  Location:  [place];  Service: Endoscopy;  Laterality: N/A;   CYSTOSCOPY Bilateral 07/02/2015   Procedure: CYSTOSCOPY;  Surgeon: Brayton Mars, MD;  Location: [facility];  Service: Gynecology;  Laterality: Bilateral;   ESOPHAGEAL DILATION  03/10/2017   Procedure: ESOPHAGEAL DILATION;  Surgeon: Lin Landsman, MD;  Location: [place];  Service: Endoscopy;;   ESOPHAGOGASTRODUODENOSCOPY N/A 03/10/2017   Procedure: ESOPHAGOGASTRODUODENOSCOPY (EGD);  Surgeon: Lin Landsman, MD;  Location: [place];  Service: Endoscopy;  Laterality: N/A;    ESOPHAGOGASTRODUODENOSCOPY (EGD) WITH PROPOFOL N/A 07/01/2019   Procedure: ESOPHAGOGASTRODUODENOSCOPY (EGD) WITH PROPOFOL;  Surgeon: Lin Landsman, MD;  Location: [place];  Service: Endoscopy;  Laterality: N/A;   ESOPHAGOGASTRODUODENOSCOPY (EGD) WITH PROPOFOL N/A 05/24/2020   Procedure: ESOPHAGOGASTRODUODENOSCOPY (EGD) WITH PROPOFOL;  Surgeon: Lin Landsman, MD;  Location: [place];  Service: Endoscopy;  Laterality: N/A;   LAPAROTOMY N/A 07/11/2015   Procedure: hematoma evacuation;  Surgeon: Brayton Mars, MD;  Location: [facility];  Service: Gynecology;  Laterality: N/A;   NISSEN FUNDOPLICATION     TUBAL LIGATION      Social History   Tobacco Use   Smoking status: Never   Smokeless tobacco: Never  Vaping Use   Vaping Use: Never used  Substance Use Topics   Alcohol use: Not Currently   Drug use: Never     Medication list has been reviewed and updated.  Current Meds  Medication Sig   atorvastatin (LIPITOR) 10 MG tablet TAKE 1 TABLET(10 MG) BY MOUTH DAILY AT 6 PM   Cholecalciferol 50 MCG (2000 UT) CAPS Take 2,000 Units by mouth daily.   diclofenac (VOLTAREN) 50 MG EC tablet Take 1 tablet (50 mg total) by mouth 2 (two) times daily.   linaclotide (LINZESS) 145 MCG CAPS capsule Take 1 capsule (145 mcg total) by mouth daily before breakfast. (Patient taking differently: Take 145 mcg by mouth as needed.)   methocarbamol (ROBAXIN) 500 MG tablet Take 1 tablet (500 mg total) by mouth every 8 (eight) hours as needed for muscle spasms.   Methylcobalamin (B12-ACTIVE PO) Take 1 tablet by mouth daily.   Multiple Vitamins-Minerals (MULTIVITAMIN WITH MINERALS) tablet Take 1 tablet by mouth daily.   nadolol (CORGARD) 40 MG tablet Take 40 mg by mouth 2 (two) times daily.   propranolol (INDERAL) 20 MG tablet Take 1 tablet (20 mg total) by mouth as needed.   valACYclovir (VALTREX) 1000 MG tablet Take 2 tablets (2,000 mg total) by mouth 2 (two) times daily.     PHQ 2/9 Scores 07/11/2021 07/04/2021 06/14/2021 05/31/2021  PHQ - 2 Score 0 0 0 0  PHQ- 9 Score 0 0 0 1    GAD 7 : Generalized Anxiety Score 07/11/2021 07/04/2021 06/14/2021 05/31/2021  Nervous, Anxious, on Edge 0 0 0 0  Control/stop worrying 0 0 0 0  Worry too much - different things 0 0 0 0  Trouble relaxing 0 0 0 0  Restless 0 0 0 0  Easily annoyed or irritable 0 0 0 0  Afraid - awful might happen 0 0 0 0  Total GAD 7 Score 0 0 0 0  Anxiety Difficulty Not difficult at all Not difficult at all Not difficult at all -    BP Readings from Last 3 Encounters:  07/11/21 104/64  07/04/21 106/72  06/14/21 106/78    Physical Exam Vitals and nursing note reviewed.  Constitutional:      General: She is not in acute distress.  Appearance: She is well-developed.  HENT:     Head: Normocephalic and atraumatic.     Right Ear: Tympanic membrane normal.     Left Ear: Tympanic membrane normal.     Mouth/Throat:     Mouth: Mucous membranes are moist.     Pharynx: Posterior oropharyngeal erythema present. No pharyngeal swelling or oropharyngeal exudate.     Tonsils: No tonsillar exudate or tonsillar abscesses.  Eyes:     Conjunctiva/sclera: Conjunctivae normal.  Cardiovascular:     Rate and Rhythm: Normal rate and regular rhythm.     Heart sounds: Normal heart sounds. No murmur heard. Pulmonary:     Effort: Pulmonary effort is normal. No respiratory distress.  Musculoskeletal:     Cervical back: Normal range of motion.  Skin:    General: Skin is warm and dry.     Findings: No rash.  Neurological:     Mental Status: She is alert and oriented to person, place, and time.  Psychiatric:        Mood and Affect: Mood normal.        Behavior: Behavior normal.    Wt Readings from Last 3 Encounters:  07/11/21 211 lb (95.7 kg)  07/04/21 210 lb (95.3 kg)  06/14/21 211 lb (95.7 kg)    BP 104/64    Pulse 82    Temp 98 F (36.7 C) (Oral)    Ht 5' 6"  (1.676 m)    Wt 211 lb (95.7 kg)    LMP  06/26/2015 (Exact Date)    SpO2 97%    BMI 34.06 kg/m   Assessment and Plan: 1. Pharyngitis, unspecified etiology Suspect PND from sinus contributing Recommend Claritin or Allegra plus Flonase daily - azithromycin (ZITHROMAX Z-PAK) 250 MG tablet; UAD  Dispense: 6 each; Refill: 0   Partially dictated using Editor, commissioning. Any errors are unintentional.  Halina Maidens, MD [facility]  07/11/2021

## 2021-07-11 NOTE — Patient Instructions (Signed)
Claritin or Allegra to reduce the drainage  Also use the nasal spray - fluticasone

## 2021-07-11 NOTE — Telephone Encounter (Signed)
Requested Prescriptions  Pending Prescriptions Disp Refills   diclofenac (VOLTAREN) 50 MG EC tablet [Pharmacy Med Name: DICLOFENAC SODIUM 50MG DR TABLETS] 60 tablet 0    Sig: TAKE 1 TABLET(50 MG) BY MOUTH TWICE DAILY     Analgesics:  NSAIDS Failed - 07/11/2021  2:13 PM      Failed - Manual Review: Labs are only required if the patient has taken medication for more than 8 weeks.      Passed - Cr in normal range and within 360 days    Creatinine, Ser  Date Value Ref Range Status  09/03/2020 0.96 0.57 - 1.00 mg/dL Final         Passed - HGB in normal range and within 360 days    Hemoglobin  Date Value Ref Range Status  09/03/2020 14.0 11.1 - 15.9 g/dL Final         Passed - PLT in normal range and within 360 days    Platelets  Date Value Ref Range Status  09/03/2020 209 150 - 450 x10E3/uL Final         Passed - HCT in normal range and within 360 days    Hematocrit  Date Value Ref Range Status  09/03/2020 41.0 34.0 - 46.6 % Final         Passed - eGFR is 30 or above and within 360 days    GFR calc Af Amer  Date Value Ref Range Status  06/29/2019 80 >59 mL/min/1.73 Final   GFR calc non Af Amer  Date Value Ref Range Status  06/29/2019 70 >59 mL/min/1.73 Final   eGFR  Date Value Ref Range Status  09/03/2020 72 >59 mL/min/1.73 Final         Passed - Patient is not pregnant      Passed - Valid encounter within last 12 months    Recent Outpatient Visits          Today Pharyngitis, unspecified etiology   [facility] Glean Hess, MD   1 week ago HSV-1 (herpes simplex virus 1) infection   [facility] Glean Hess, MD   3 weeks ago Spondylosis of 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   [facility] Montel Culver, MD   1 month ago Acute midline low back pain without sciatica   [facility] Glean Hess, MD   6 months ago COVID-19   [facility] Glean Hess, MD      Future  Appointments            In 1 week Zigmund Daniel, Earley Abide, MD [facility], [place]   In 1 month Army Melia, Jesse Sans, MD [facility], [facility]

## 2021-07-11 NOTE — Telephone Encounter (Signed)
Inbound call from patient stating that she had a referral from [facility] for  Bravo capsule, pH impedence, manometry.   Please advise.

## 2021-07-12 ENCOUNTER — Ambulatory Visit: Payer: PRIVATE HEALTH INSURANCE | Admitting: Family Medicine

## 2021-07-12 NOTE — Telephone Encounter (Signed)
I have the patient scheduled for manometry and impedance study. Would you check with Dr Marius Ditch about the Bravo please?

## 2021-07-12 NOTE — Telephone Encounter (Signed)
What is the best way to schedule this? Should I get the Bravo Placement done at the [facility] and schedule the manometry for next opening?

## 2021-07-12 NOTE — Telephone Encounter (Signed)
She does not need both Bravo and pH impedance.  If they are requesting esophageal manometry, it is easier to get pH impedance study off [facility] and schedule it along with esophageal manometry.  She will not need additional Bravo study.  Thank you

## 2021-07-15 NOTE — Telephone Encounter (Signed)
Got it, we can do that.  [name], please schedule for esophageal manometry and EGD with Bravo study at [facility]. We may have to do it on 2 separate days based on schedule availability.  Thanks

## 2021-07-18 ENCOUNTER — Other Ambulatory Visit: Payer: Self-pay

## 2021-07-18 DIAGNOSIS — K219 Gastro-esophageal reflux disease without esophagitis: Secondary | ICD-10-CM

## 2021-07-18 NOTE — Telephone Encounter (Signed)
Patient called to follow up on scheduling the procedure and was wondering if it can be done on a Saturday? Ask that you leave her a detailed message if she does not answer when you call her back.

## 2021-07-19 ENCOUNTER — Other Ambulatory Visit: Payer: Self-pay

## 2021-07-19 DIAGNOSIS — R0789 Other chest pain: Secondary | ICD-10-CM

## 2021-07-19 DIAGNOSIS — K219 Gastro-esophageal reflux disease without esophagitis: Secondary | ICD-10-CM

## 2021-07-19 NOTE — Telephone Encounter (Signed)
Spoke with the patient. No weekend appointments Bravo EGD 08/23/19 arrive at 9:00 am Esophageal manometry 10/30/21 arrive [place] at 10:00 am. Written instructions provided via mail and My Chart.

## 2021-07-23 ENCOUNTER — Other Ambulatory Visit: Payer: Self-pay

## 2021-07-23 NOTE — Telephone Encounter (Signed)
Patient called would like to know how long the procedure will be from start to finish also asked if to would be possible to reschedule for April.

## 2021-07-23 NOTE — Telephone Encounter (Signed)
Spoke with the patient. Discussed approximate time of manometry from start to finish.  Moved the esophageal manometry to 07/24/21 at 12:30 pm. Instructed to arrive at 12:00 pm.

## 2021-07-24 ENCOUNTER — Encounter: Payer: Self-pay | Admitting: Family Medicine

## 2021-07-24 ENCOUNTER — Other Ambulatory Visit: Payer: Self-pay

## 2021-07-24 ENCOUNTER — Ambulatory Visit (HOSPITAL_COMMUNITY)
Admission: RE | Admit: 2021-07-24 | Discharge: 2021-07-24 | Disposition: A | Discharge status: Home / Self Care | Payer: PRIVATE HEALTH INSURANCE | Attending: Gastroenterology | Admitting: Gastroenterology

## 2021-07-24 ENCOUNTER — Ambulatory Visit (INDEPENDENT_AMBULATORY_CARE_PROVIDER_SITE_OTHER): Payer: PRIVATE HEALTH INSURANCE | Admitting: Family Medicine

## 2021-07-24 ENCOUNTER — Encounter (HOSPITAL_COMMUNITY)
Admission: RE | Disposition: A | Discharge status: Home / Self Care | Payer: Self-pay | Source: Home / Self Care | Attending: Gastroenterology

## 2021-07-24 ENCOUNTER — Encounter (HOSPITAL_COMMUNITY): Payer: Self-pay | Admitting: Gastroenterology

## 2021-07-24 DIAGNOSIS — M25551 Pain in right hip: Secondary | ICD-10-CM

## 2021-07-24 DIAGNOSIS — M47817 Spondylosis without myelopathy or radiculopathy, lumbosacral region: Secondary | ICD-10-CM

## 2021-07-24 DIAGNOSIS — R079 Chest pain, unspecified: Secondary | ICD-10-CM | POA: Insufficient documentation

## 2021-07-24 DIAGNOSIS — Z01818 Encounter for other preprocedural examination: Secondary | ICD-10-CM | POA: Diagnosis not present

## 2021-07-24 DIAGNOSIS — K219 Gastro-esophageal reflux disease without esophagitis: Secondary | ICD-10-CM | POA: Diagnosis not present

## 2021-07-24 DIAGNOSIS — R1310 Dysphagia, unspecified: Secondary | ICD-10-CM | POA: Insufficient documentation

## 2021-07-24 DIAGNOSIS — M545 Low back pain, unspecified: Secondary | ICD-10-CM

## 2021-07-24 HISTORY — PX: ESOPHAGEAL MANOMETRY: SHX5429

## 2021-07-24 SURGERY — MANOMETRY, ESOPHAGUS
Anesthesia: Choice

## 2021-07-24 MED ORDER — DICLOFENAC SODIUM 50 MG PO TBEC: 50.0000 mg | DELAYED_RELEASE_TABLET | Freq: Two times a day (BID) | ORAL | 0 refills | Status: DC | PRN

## 2021-07-24 MED ORDER — LIDOCAINE VISCOUS HCL 2 % MT SOLN
OROMUCOSAL | Status: AC
  Filled 2021-07-24: qty 15

## 2021-07-24 MED ORDER — METHOCARBAMOL 500 MG PO TABS: 500.0000 mg | ORAL_TABLET | Freq: Three times a day (TID) | ORAL | 0 refills | Status: DC | PRN

## 2021-07-24 SURGICAL SUPPLY — 2 items
FACESHIELD LNG OPTICON STERILE (SAFETY) IMPLANT
GLOVE BIO SURGEON STRL SZ8 (GLOVE) ×4 IMPLANT

## 2021-07-24 NOTE — Progress Notes (Signed)
Esophageal manometry performed per protocol.  Cathether placed at 51 cm at right nare.  Patient tolerated well.  Report to be sent to Dr. Harl Bowie

## 2021-07-25 ENCOUNTER — Encounter (HOSPITAL_COMMUNITY): Payer: Self-pay | Admitting: Gastroenterology

## 2021-07-25 DIAGNOSIS — M25551 Pain in right hip: Secondary | ICD-10-CM | POA: Insufficient documentation

## 2021-07-25 NOTE — Progress Notes (Signed)
°  ° °  Primary Care / Sports Medicine Office Visit  Patient Information:  Patient ID: Brittany Baldwin, female DOB: 02-Jan-1971 Age: 51 y.o. MRN: [id number]   Brittany Baldwin is a pleasant 51 y.o. female presenting with the following:  Chief Complaint  Patient presents with   Back Pain    Lower, better per patient; 5/10 pain    Vitals:   07/24/21 1134  BP: 112/80  Pulse: 68   Vitals:   07/24/21 1134  Weight: 211 lb (95.7 kg)  Height: 5\' 6"  (1.676 m)   Body mass index is 34.06 kg/m.  No results found.   Independent interpretation of notes and tests performed by another provider:    X-rays do show multilevel spondyloarthropathy primarily at the facets of L3-4, L4-5, and maximally at L5-S1.  There is questionable grade 1 listhesis at L2-3.  Procedures performed:   None  Pertinent History, Exam, Impression, and Recommendations:   Spondylosis of 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 Brittany Baldwin presents for follow-up to acute on chronic low back pain, at her last visit on 06/14/2021 plan was for dedicated lumbar spine x-rays, relative rest, scheduled diclofenac.  Since that time she states that she has noted gradual improvement, denies any radiation down the legs but has had intermittent right lateral hip pain.  Her x-rays do show multilevel spondyloarthropathy primarily at the facets of L3-4, L4-5, and maximally at L5-S1.  There is questionable listhesis at L2-3.  From a physical exam standpoint she does have focal tenderness at the right SI joint and right greater trochanteric region.  At this stage, given her interval improvement, x-ray findings, we discussed various treatment strategies.  From medication management standpoint we will have her transition to as needed dosing of diclofenac, write for as needed methocarbamol, have her start a formal physical therapy program, and return for close follow-up in 6 weeks.  Pending symptoms at return, can consider local  ultrasound-guided cortisone injections at the SI joint and/or right greater trochanter.  Chronic condition, symptomatic, Rx management, independent interpretation x-ray  Greater trochanteric pain syndrome of right lower extremity See additional assessment(s) for plan details.   Orders & Medications Meds ordered this encounter  Medications   methocarbamol (ROBAXIN) 500 MG tablet    Sig: Take 1 tablet (500 mg total) by mouth every 8 (eight) hours as needed for muscle spasms.    Dispense:  30 tablet    Refill:  0   diclofenac (VOLTAREN) 50 MG EC tablet    Sig: Take 1 tablet (50 mg total) by mouth 2 (two) times daily as needed.    Dispense:  60 tablet    Refill:  0   Orders Placed This Encounter  Procedures   Ambulatory referral to Physical Therapy     Return in about 6 weeks (around 09/04/2021).     Montel Culver, MD   Primary Care Sports Medicine [place]

## 2021-07-25 NOTE — Assessment & Plan Note (Signed)
See additional assessment(s) for plan details. 

## 2021-07-25 NOTE — Assessment & Plan Note (Addendum)
Brittany Baldwin presents for follow-up to acute on chronic low back pain, at her last visit on 06/14/2021 plan was for dedicated lumbar spine x-rays, relative rest, scheduled diclofenac.  Since that time she states that she has noted gradual improvement, denies any radiation down the legs but has had intermittent right lateral hip pain.  Her x-rays do show multilevel spondyloarthropathy primarily at the facets of L3-4, L4-5, and maximally at L5-S1.  There is questionable listhesis at L2-3.  From a physical exam standpoint she does have focal tenderness at the right SI joint and right greater trochanteric region.  At this stage, given her interval improvement, x-ray findings, we discussed various treatment strategies.  From medication management standpoint we will have her transition to as needed dosing of diclofenac, write for as needed methocarbamol, have her start a formal physical therapy program, and return for close follow-up in 6 weeks.  Pending symptoms at return, can consider local ultrasound-guided cortisone injections at the SI joint and/or right greater trochanter.  Chronic condition, symptomatic, Rx management, independent interpretation x-ray

## 2021-08-09 ENCOUNTER — Encounter: Payer: Self-pay | Admitting: Family Medicine

## 2021-08-10 ENCOUNTER — Encounter: Payer: Self-pay | Admitting: Gastroenterology

## 2021-08-12 ENCOUNTER — Other Ambulatory Visit: Payer: Self-pay

## 2021-08-12 ENCOUNTER — Encounter: Payer: Self-pay | Admitting: Physical Therapy

## 2021-08-12 ENCOUNTER — Ambulatory Visit: Payer: PRIVATE HEALTH INSURANCE | Attending: Family Medicine | Admitting: Physical Therapy

## 2021-08-12 DIAGNOSIS — M47817 Spondylosis without myelopathy or radiculopathy, lumbosacral region: Secondary | ICD-10-CM | POA: Diagnosis not present

## 2021-08-12 DIAGNOSIS — M6281 Muscle weakness (generalized): Secondary | ICD-10-CM | POA: Insufficient documentation

## 2021-08-12 DIAGNOSIS — M25551 Pain in right hip: Secondary | ICD-10-CM | POA: Diagnosis present

## 2021-08-12 DIAGNOSIS — M545 Low back pain, unspecified: Secondary | ICD-10-CM | POA: Insufficient documentation
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
## 2021-08-13 ENCOUNTER — Encounter: Payer: Self-pay | Admitting: Certified Registered Nurse Anesthetist

## 2021-08-14 ENCOUNTER — Encounter: Payer: Self-pay | Admitting: Physical Therapy

## 2021-08-14 ENCOUNTER — Ambulatory Visit: Payer: PRIVATE HEALTH INSURANCE | Admitting: Physical Therapy

## 2021-08-14 ENCOUNTER — Other Ambulatory Visit: Payer: Self-pay

## 2021-08-14 DIAGNOSIS — M6281 Muscle weakness (generalized): Secondary | ICD-10-CM

## 2021-08-14 DIAGNOSIS — M545 Low back pain, unspecified: Secondary | ICD-10-CM

## 2021-08-14 DIAGNOSIS — M25551 Pain in right hip: Secondary | ICD-10-CM

## 2021-08-14 NOTE — Therapy (Signed)
[place] ?[facility] ?[address]. [place], [place], [address] ?Phone: [phone]   Fax:  [phone] ? ?Physical Therapy Treatment ? ?Patient Details  ?Name: Brittany Baldwin ?MRN: [id number] ?Date of Birth: 1970-12-26 ?Referring Provider (PT): Montel Culver, MD ? ? ?Encounter Date: 08/14/2021 ? ? PT End of Session - 08/17/21 2130   ? ? Visit Number 2   ? Number of Visits 13   ? Date for PT Re-Evaluation 09/26/21   ? Authorization Type Generic commercial   ? Progress Note Due on Visit 10   ? PT Start Time 0603   ? PT Stop Time 6834   ? PT Time Calculation (min) 47 min   ? Activity Tolerance Patient limited by pain   ? Behavior During Therapy [facility] for tasks assessed/performed   Distressed secondary to pain  ? ?  ?  ? ?  ? ? ?Past Medical History:  ?Diagnosis Date  ? Anemia   ? H/O   ? Anxiety   ? Dysrhythmia   ? SVT-HAD CARDIAC ABLATION DONE IN 2016 BUT PT STATES SHE STILL GETS INTERMITTENT TACHYCARDIC AND IS SYPMPTOMATIC WITH SOB, DIZZINESS DURING TACHY EPISODES (06-25-15)  ? Elevated parathyroid hormone 07/03/2016  ? Fibroid   ? GERD (gastroesophageal reflux disease)   ? Headache   ? H/O  ? Heart murmur   ? as child  ? History of fundoplication 1/96/2229  ? History of pulmonary embolus during pregnancy 05/16/2015  ? 2001  ? Hoarseness, persistent 10/06/2017  ? Hypercalcemia 01/18/2017  ? PTH, PTHrP, 1,25 vit D and 25 vit D, Thyroid and Serum free light chains - normal  Repeat Calcium WNL  ? Hyperlipemia   ? Motion sickness   ? cars, boats  ? PE (pulmonary thromboembolism) ([place]) 2000  ? while on bed rest during pregnancy  ? PONV (postoperative nausea and vomiting)   ? Shortness of breath dyspnea   ? ASSOCIATED WITH TACHYCARDIA  ? Status post abdominal hysterectomy 07/02/2015  ? Status post TAH, bilateral salpingectomy. PATHOLOGY: Uterine fibroids and adenomyosis   ? Tachycardia   ? ? ?Past Surgical History:  ?Procedure Laterality Date  ? ABDOMINAL HYSTERECTOMY Bilateral 07/02/2015  ?  Procedure: HYSTERECTOMY ABDOMINAL WITH BS;  Surgeon: Brayton Mars, MD;  Location: [facility];  Service: Gynecology;  Laterality: Bilateral;  ? APPENDECTOMY    ? BIOPSY N/A 05/24/2020  ? Procedure: BIOPSY;  Surgeon: Lin Landsman, MD;  Location: [place];  Service: Endoscopy;  Laterality: N/A;  ? CARDIAC ELECTROPHYSIOLOGY STUDY AND ABLATION    ? CHOLECYSTECTOMY    ? COLONOSCOPY  2013  ? COLONOSCOPY WITH PROPOFOL N/A 03/10/2017  ? Procedure: COLONOSCOPY WITH PROPOFOL;  Surgeon: Lin Landsman, MD;  Location: [place];  Service: Endoscopy;  Laterality: N/A;  ? CYSTOSCOPY Bilateral 07/02/2015  ? Procedure: CYSTOSCOPY;  Surgeon: Brayton Mars, MD;  Location: [facility];  Service: Gynecology;  Laterality: Bilateral;  ? ESOPHAGEAL DILATION  03/10/2017  ? Procedure: ESOPHAGEAL DILATION;  Surgeon: Lin Landsman, MD;  Location: [place];  Service: Endoscopy;;  ? ESOPHAGEAL MANOMETRY N/A 07/24/2021  ? Procedure: ESOPHAGEAL MANOMETRY (EM);  Surgeon: Mauri Pole, MD;  Location: [facility];  Service: Endoscopy;  Laterality: N/A;  ? ESOPHAGOGASTRODUODENOSCOPY N/A 03/10/2017  ? Procedure: ESOPHAGOGASTRODUODENOSCOPY (EGD);  Surgeon: Lin Landsman, MD;  Location: [place];  Service: Endoscopy;  Laterality: N/A;  ? ESOPHAGOGASTRODUODENOSCOPY (EGD) WITH PROPOFOL N/A 07/01/2019  ? Procedure: ESOPHAGOGASTRODUODENOSCOPY (EGD) WITH PROPOFOL;  Surgeon: Lin Landsman, MD;  Location: [place];  Service: Endoscopy;  Laterality: N/A;  ? ESOPHAGOGASTRODUODENOSCOPY (EGD) WITH PROPOFOL N/A 05/24/2020  ? Procedure: ESOPHAGOGASTRODUODENOSCOPY (EGD) WITH PROPOFOL;  Surgeon: Lin Landsman, MD;  Location: [place];  Service: Endoscopy;  Laterality: N/A;  ? LAPAROTOMY N/A 07/11/2015  ? Procedure: hematoma evacuation;  Surgeon: Brayton Mars, MD;  Location: [facility];  Service: Gynecology;  Laterality: N/A;  ? NISSEN FUNDOPLICATION    ?  TUBAL LIGATION    ? ? ?There were no vitals filed for this visit. ? ? Subjective Assessment - 08/17/21 2129   ? ? Subjective Patient reports pain across her waistline primarily at arrival to PT. She feels that stretches helped with her hip. Patient reports pain that is axial and spans across paraspinal region. 5-6/10 NPRS today; pt reports 9/10 pain during worst flare-ups. She felt that positional strategies helped, but did not resolve pain.   ? 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 There is questionable grade 1 listhesis at L2-3. Patient feels that her symptoms have "trickled up" to her neck. Pt feels that symptoms began worsening with start of new job with ample time spent at desk/computer. Pt started new job February 13th, began full duties since 07/29/21 - she is now at computer throughout the day. Patient also has 1-hour commute to work and has to go into the office at least the first 6 months of this new job. Pt reports that she had isolated episode of back pain with difficulty standing up straight this past Christmas. Pt denies referred pain down her RLE. She reports that pain in back is main issue (constant); she reports that R hip pain is more intermittent. Patient reports atraumatic onset of symptoms; no falls. Pt reports intermittent pain on R side at night and having to change positions frequently which sometimes help. Patient reports no bowel/bladder changes. Pt reports no saddle paresthesias. Patient reports no sudden weight loss. Pt reports feeling better when walking generally, though she may still feel hip pain that is tolerable.   ? Limitations Sitting;Standing;Walking;House hold activities   prolonged computer work; lying/sleeping  ? Diagnostic tests X-ray:   ? Patient Stated Goals Decreased pain, able to complete work  and desired outdoor activity   ? ?  ?  ? ?  ? ? ? ? ?Repeated movement screen ?Repeated extension in standing: increase during, no effect after  ? ?Repeated flexion in lying: increase during, worse after  ? ? ? ? ?TREATMENT ? ? ?Therapeutic Exercise - for improved soft tissue flexibility and extensibility as needed for ROM, improved strength as needed to improve performance of CKC activities/functional movements ? ?Repeated extension in standing; 2x10 ?Repeated flexion in lying; x10 ? ?Lower trunk rotations; x10 ea dir ?Piriformis stretching; reviewed ? ?Physioball lumbar flexion rollout, seated; 3-way forward flexion and R/L diagonals, x5 ea dir, 3 sec hold ? ?Patient education: Discussed strategies for lumbar decompression and use of self-traction technique at home ? ? ? ?Manual Therapy - for symptom modulation, soft tissue sensitivity and mobility, joint mobility, ROM  ? ?General lumbar traction in hooklying with Mulligan belt; x10 sec on, 10 sec off [good response, stopped due to calf discomfort with direct pressure] ?Attempted lumbar traction with patient fully supine and therapist grasping ankles - poor symptomatic response; thus, this technique was  stopped ?STM/DTM and IASTM with Hypervolt along L3-5 paraspinals bilaterally ? ? ?MHP (unbilled) utilized prior to and during manual therapy for analgesic effect and improved soft tissue extensibility; x 5 minutes with patient in prone lying with 2 pillows under pelvis  ? ? ? ? ? ?ASSESSMENT ?Patient has limited response with repeated movement screen with no change with extension and worsening symptoms with repeated flexion. May utilize prone on elbows/prone lying to further assess for notable change in movement baselines or symptoms. Pt does respond well with manual lumbar traction in hooklying, but she has limited tolerance of direct pressure on her calves with this technique; pt does not have notable relief with use of supine bilateral LE distraction technique.  Patient has remaining deficits in thoracolumbar AROM, postural changes, and local nociceptive pain along lower lumbar paraspinals, sensitivity along R greater trochanter and R gluteus medius/deep hip

## 2021-08-19 ENCOUNTER — Ambulatory Visit: Payer: PRIVATE HEALTH INSURANCE | Admitting: Physical Therapy

## 2021-08-19 ENCOUNTER — Encounter: Payer: PRIVATE HEALTH INSURANCE | Admitting: Physical Therapy

## 2021-08-21 ENCOUNTER — Ambulatory Visit: Payer: PRIVATE HEALTH INSURANCE | Admitting: Physical Therapy

## 2021-08-21 ENCOUNTER — Other Ambulatory Visit: Payer: Self-pay

## 2021-08-21 ENCOUNTER — Encounter: Payer: PRIVATE HEALTH INSURANCE | Admitting: Gastroenterology

## 2021-08-21 DIAGNOSIS — M25551 Pain in right hip: Secondary | ICD-10-CM

## 2021-08-21 DIAGNOSIS — M545 Low back pain, unspecified: Secondary | ICD-10-CM

## 2021-08-21 DIAGNOSIS — M6281 Muscle weakness (generalized): Secondary | ICD-10-CM

## 2021-08-21 NOTE — Patient Instructions (Incomplete)
?  ?  Repeated movement screen ?Repeated extension in standing: increase during, no effect after  ?  ?Repeated flexion in lying: increase during, worse after  ?  ?  ?  ?  ?TREATMENT ?  ?  ?Therapeutic Exercise - for improved soft tissue flexibility and extensibility as needed for ROM, improved strength as needed to improve performance of CKC activities/functional movements ?  ?Repeated extension in standing; 2x10 ?Repeated flexion in lying; x10 ?  ?Lower trunk rotations; x10 ea dir ?Piriformis stretching; reviewed ?  ?Physioball lumbar flexion rollout, seated; 3-way forward flexion and R/L diagonals, x5 ea dir, 3 sec hold ?  ?Patient education: Discussed strategies for lumbar decompression and use of self-traction technique at home ?  ?  ?  ?Manual Therapy - for symptom modulation, soft tissue sensitivity and mobility, joint mobility, ROM  ?  ?General lumbar traction in hooklying with Mulligan belt; x10 sec on, 10 sec off [good response, stopped due to calf discomfort with direct pressure] ?Attempted lumbar traction with patient fully supine and therapist grasping ankles - poor symptomatic response; thus, this technique was stopped ?STM/DTM and IASTM with Hypervolt along L3-5 paraspinals bilaterally ?  ?  ?MHP (unbilled) utilized prior to and during manual therapy for analgesic effect and improved soft tissue extensibility; x 5 minutes with patient in prone lying with 2 pillows under pelvis  ?  ?  ?  ?  ?  ?ASSESSMENT ?Patient has limited response with repeated movement screen with no change with extension and worsening symptoms with repeated flexion. May utilize prone on elbows/prone lying to further assess for notable change in movement baselines or symptoms. Pt does respond well with manual lumbar traction in hooklying, but she has limited tolerance of direct pressure on her calves with this technique; pt does not have notable relief with use of supine bilateral LE distraction technique. Patient has remaining  deficits in thoracolumbar AROM, postural changes, and local nociceptive pain along lower lumbar paraspinals, sensitivity along R greater trochanter and R gluteus medius/deep hip external rotators. Patient will benefit from continued skilled therapeutic intervention to address the above deficits as needed for improved function and QoL.  ?  ?  ?

## 2021-08-22 ENCOUNTER — Encounter: Payer: Self-pay | Admitting: Gastroenterology

## 2021-08-22 ENCOUNTER — Ambulatory Visit (AMBULATORY_SURGERY_CENTER): Payer: PRIVATE HEALTH INSURANCE | Admitting: Gastroenterology

## 2021-08-22 ENCOUNTER — Telehealth: Payer: Self-pay | Admitting: Gastroenterology

## 2021-08-22 ENCOUNTER — Other Ambulatory Visit: Payer: Self-pay | Admitting: Internal Medicine

## 2021-08-22 ENCOUNTER — Ambulatory Visit
Admission: EM | Admit: 2021-08-22 | Discharge: 2021-08-22 | Disposition: A | Discharge status: Home / Self Care | Payer: PRIVATE HEALTH INSURANCE

## 2021-08-22 VITALS — BP 125/81 | HR 80 | Temp 97.5°F | Resp 18 | Ht 66.0 in | Wt 209.0 lb

## 2021-08-22 DIAGNOSIS — R1310 Dysphagia, unspecified: Secondary | ICD-10-CM | POA: Diagnosis not present

## 2021-08-22 DIAGNOSIS — R490 Dysphonia: Secondary | ICD-10-CM

## 2021-08-22 DIAGNOSIS — R0789 Other chest pain: Secondary | ICD-10-CM

## 2021-08-22 DIAGNOSIS — K219 Gastro-esophageal reflux disease without esophagitis: Secondary | ICD-10-CM | POA: Diagnosis present

## 2021-08-22 DIAGNOSIS — E782 Mixed hyperlipidemia: Secondary | ICD-10-CM

## 2021-08-22 MED ORDER — LIDOCAINE VISCOUS HCL 2 % MT SOLN: 5.0000 mL | OROMUCOSAL | 0 refills | Status: DC | PRN

## 2021-08-22 MED ORDER — SODIUM CHLORIDE 0.9 % IV SOLN: 500.0000 mL | Freq: Once | INTRAVENOUS | Status: DC

## 2021-08-22 NOTE — ED Triage Notes (Signed)
Patient presents to Urgent Care with complaints of trouble swallowing, she states this happened about 15 mins she had salmon and rice and started having trouble swallowing. She reports trouble breathing. Pt states she did have an EGD procedure done today. She reports vomiting prior to entering building.  ?

## 2021-08-22 NOTE — Progress Notes (Signed)
Report given to PACU, vss 

## 2021-08-22 NOTE — Telephone Encounter (Signed)
Patient called to let you know that after her procedure today, she ate and had difficulty swallowing. She went to urgent care for this. Patient states she wants to speak with a nurse. ?

## 2021-08-22 NOTE — Patient Instructions (Addendum)
Post-op Bravo pH instructions ?Once you get home: ? ?Eat normally and go about your daily routine/activities ?Limit drinking fluids or eating between meals ?Do not chew gum or eat hard candy ?DO NOT take any antacid or anti-reflux medications during the 48-hour monitoring time, unless instructed by your physician ? ?Recording events: ?Events to be recorded are:  Record using event buttons on recorder and write on paper diary form ?Every time you eat or drink something (other than water) ?2.   Periods of lying down/reclining ?3.  Symptoms:  may include heartburn, regurgitation, chest pain, cough or specify if other. ? ?A paper diary is also provided to record the times of your reflux symptoms and times for meals and when you lie down. ? ?The recorder needs to remain within 3 feet (arms length) of you during the testing period (48 hours). ?If you should forget and move outside of a 3-foot radius of the receiver you may hear beeping and you will see a ?C1? error in the display window on the top of the receiver.  Please pick up the receiver and hold close to you to re-establish the connection and the error message disappears. ? ?You may take a bath/shower during the testing period, but the recorder must not get wet and must remain within 3 feet of you. Please leave the receiver outside of the shower or tub while bathing. The monitoring period will be for 48 hours after placement of the capsule. ? ?At the end of the 48 hours, you will return the recorder, and your diary, to our [facility] front desk.  A nurse will meet you to collect the device and answer any questions you may have.  The device should turn off once the 48 hours is complete.  ? ?What to expect after placement of the capsule: ? ?Some patients experience a vague sensation that something is in their esophagus or that they ?feel? the capsule when they swallow food.  Should you experience this, chewing food carefully or drinking liquids may  minimize this sensation.  ? ?After the test is complete, the disposable capsule will fall off the wall of your esophagus within 5-10 days and pass naturally with your bowel movement through the digestive tract. ? ?Once the recorder is returned, your provider will review and interpret your recordings and contact you to discuss your results.  This may take up to two weeks.  ? ?DO NOT have an MRI for 30 days after your procedure to ensure the capsule is no longer inside your body ? ?It is imperative that you return the recorder on _________________________ by 3:00pm.  Your information must be downloaded at this time to obtain your results.   ? ? ?Resume previous diet.  Continue present medications except- ?HOLD any antacids for next 48 hours. ? Await 48 hour BRAVO report.  Return to [facility] Dr Jolly Mango for follow up as needed.   ?YOU HAD AN ENDOSCOPIC PROCEDURE TODAY AT [facility]:   Refer to the procedure report that was given to you for any specific questions about what was found during the examination.  If the procedure report does not answer your questions, please call your gastroenterologist to clarify.  If you requested that your care partner not be given the details of your procedure findings, then the procedure report has been included in a sealed envelope for you to review at your convenience later. ? ?YOU SHOULD EXPECT: Some feelings of bloating in the abdomen. Passage  of more gas than usual.  Walking can help get rid of the air that was put into your GI tract during the procedure and reduce the bloating. If you had a lower endoscopy (such as a colonoscopy or flexible sigmoidoscopy) you may notice spotting of blood in your stool or on the toilet paper. If you underwent a bowel prep for your procedure, you may not have a normal bowel movement for a few days. ? ?Please Note:  You might notice some irritation and congestion in your nose or some drainage.  This is from the oxygen used during your  procedure.  There is no need for concern and it should clear up in a day or so. ? ?SYMPTOMS TO REPORT IMMEDIATELY: ? ? Vomiting of blood or coffee ground material ? New chest pain or pain under the shoulder blades ? Painful or persistently difficult swallowing ? New shortness of breath ? Fever of 100?F or higher ? Black, tarry-looking stools ? ?For urgent or emergent issues, a gastroenterologist can be reached at any hour by calling [phone]. ?Do not use MyChart messaging for urgent concerns.  ? ? ?DIET:  We do recommend a small meal at first, but then you may proceed to your regular diet.  Drink plenty of fluids but you should avoid alcoholic beverages for 24 hours. ? ?ACTIVITY:  You should plan to take it easy for the rest of today and you should NOT DRIVE or use heavy machinery until tomorrow (because of the sedation medicines used during the test).   ? ?FOLLOW UP: ?Our staff will call the number listed on your records 48-72 hours following your procedure to check on you and address any questions or concerns that you may have regarding the information given to you following your procedure. If we do not reach you, we will leave a message.  We will attempt to reach you two times.  During this call, we will ask if you have developed any symptoms of COVID 19. If you develop any symptoms (ie: fever, flu-like symptoms, shortness of breath, cough etc.) before then, please call [phone].  If you test positive for Covid 19 in the 2 weeks post procedure, please call and report this information to [place].   ? ?If any biopsies were taken you will be contacted by phone or by letter within the next 1-3 weeks.  Please call us at [phone] if you have not heard about the biopsies in 3 weeks.  ? ? ?SIGNATURES/CONFIDENTIALITY: ?You and/or your care partner have signed paperwork which will be entered into your electronic medical record.  These signatures attest to the fact that that the information above on your After  Visit Summary has been reviewed and is understood.  Full responsibility of the confidentiality of this discharge information lies with you and/or your care-partner. ?   ?

## 2021-08-22 NOTE — ED Notes (Signed)
Provider notified

## 2021-08-22 NOTE — Op Note (Signed)
[place] ?Patient Name: Brittany Baldwin ?Procedure Date: 08/22/2021 9:15 AM ?MRN: [id number] ?Endoscopist: Mauri Pole , MD ?Age: 51 ?Referring MD:  ?Date of Birth: 05-18-1971 ?Gender: Female ?Account #: [id number] ?Procedure:                Upper GI endoscopy ?Indications:              Dysphagia, Esophageal reflux symptoms that recur  ?                          despite appropriate therapy ?Medicines:                Monitored Anesthesia Care ?Procedure:                Pre-Anesthesia Assessment: ?                          - Prior to the procedure, a History and Physical  ?                          was performed, and patient medications and  ?                          allergies were reviewed. The patient's tolerance of  ?                          previous anesthesia was also reviewed. The risks  ?                          and benefits of the procedure and the sedation  ?                          options and risks were discussed with the patient.  ?                          All questions were answered, and informed consent  ?                          was obtained. Prior Anticoagulants: The patient has  ?                          taken no previous anticoagulant or antiplatelet  ?                          agents. ASA Grade Assessment: II - A patient with  ?                          mild systemic disease. After reviewing the risks  ?                          and benefits, the patient was deemed in  ?                          satisfactory condition to undergo the procedure. ?  After obtaining informed consent, the endoscope was  ?                          passed under direct vision. Throughout the  ?                          procedure, the patient's blood pressure, pulse, and  ?                          oxygen saturations were monitored continuously. The  ?                          GIF [id number] #[id number] was introduced through the  ?                          mouth, and advanced to the  second part of duodenum.  ?                          The upper GI endoscopy was accomplished without  ?                          difficulty. The patient tolerated the procedure  ?                          well. ?Scope In: ?Scope Out: ?Findings:                 The Z-line was regular and was found 39 cm from the  ?                          incisors. ?                          The examined esophagus was normal. ?                          Evidence of a Nissen fundoplication was found in  ?                          the cardia. The wrap appeared intact. This was  ?                          traversed. ?                          The stomach was normal. ?                          The cardia and gastric fundus were normal on  ?                          retroflexion. ?                          The examined duodenum was normal. ?  The BRAVO capsule with delivery system was  ?                          introduced through the mouth and advanced into the  ?                          esophagus, such that the BRAVO pH capsule was  ?                          positioned 33 cm from the incisors, which was 6 cm  ?                          proximal to the GE junction. Suction was applied to  ?                          the well of the BRAVO pH capsule to suck in the  ?                          adjacent mucosa of the esophagus using the external  ?                          vacuum pump set at a minimum vacuum pressure of 570  ?                          mmHg for 45 seconds. The BRAVO pH capsule was then  ?                          deployed by depressing the plunger on top of the  ?                          handle to advance the locking pin into the mucosa,  ?                          thereby attaching the capsule to the esophagus. The  ?                          plunger was then rotated a quarter turn clockwise  ?                          to release the capsule from the delivery system.  ?                          The  delivery system was then withdrawn. Endoscopy  ?                          was utilized for probe placement and diagnostic  ?                          evaluation. The scope was reinserted to evaluate  ?  placement of the BRAVO capsule. Visualization  ?                          showed the BRAVO capsule to be in an appropriate  ?                          position. ?Complications:            No immediate complications. ?Estimated Blood Loss:     Estimated blood loss was minimal. ?Impression:               - Z-line regular, 39 cm from the incisors. ?                          - Normal esophagus. ?                          - A Nissen fundoplication was found. The wrap  ?                          appears intact. ?                          - Normal stomach. ?                          - Normal examined duodenum. ?                          - The BRAVO pH capsule was positioned 33 cm from  ?                          the incisors, which was 6 cm proximal to the GE  ?                          junction. ?                          - No specimens collected. ?Recommendation:           - Resume previous diet. ?                          - Continue present medications. Hold any antacids  ?                          for next 48 hours ?                          - Await 48hr pH Barvo report ?                          - Return to [facility] (Dr. Marius Ditch) for follow up as  ?                          needed ?Mauri Pole, MD ?08/22/2021 9:58:58 AM ?This report has been signed electronically. ?

## 2021-08-22 NOTE — ED Notes (Signed)
Patient was given water and she was able to get the food down that was caught in her throat. Vitals were rechecked twice. Spouse called to bedside with patient. Patient states she is fine and decides to not see provider. Provider notified.  ?

## 2021-08-22 NOTE — Progress Notes (Signed)
[facility] History and Physical ? ? ?Primary Care Physician:  Glean Hess, MD ? ? ?Reason for Procedure:  Recurrent GERD symptoms despite treatment  ? ?Plan:    EGD with possible interventions as needed and pH Bravo sensor placement ? ? ? ? ?HPI: Brittany Baldwin is a very pleasant 51 y.o. female here for EGD with pH Bravo sensor placement s/p fundoplication with recurrent GERD symptoms and  hoarseness. ? ?She is followed by Dr.Vanga and was referred here for Bravo placement as it is unavailable at [facility] ? ?The risks and benefits as well as alternatives of endoscopic procedure(s) have been discussed and reviewed. All questions answered. The patient agrees to proceed. ? ? ? ?Past Medical History:  ?Diagnosis Date  ? Anemia   ? H/O   ? Anxiety   ? Dysrhythmia   ? SVT-HAD CARDIAC ABLATION DONE IN 2016 BUT PT STATES SHE STILL GETS INTERMITTENT TACHYCARDIC AND IS SYPMPTOMATIC WITH SOB, DIZZINESS DURING TACHY EPISODES (06-25-15)  ? Elevated parathyroid hormone 07/03/2016  ? Fibroid   ? GERD (gastroesophageal reflux disease)   ? Headache   ? H/O  ? Heart murmur   ? as child  ? History of fundoplication 9/32/6712  ? History of pulmonary embolus during pregnancy 05/16/2015  ? 2001  ? Hoarseness, persistent 10/06/2017  ? Hypercalcemia 01/18/2017  ? PTH, PTHrP, 1,25 vit D and 25 vit D, Thyroid and Serum free light chains - normal  Repeat Calcium WNL  ? Hyperlipemia   ? Motion sickness   ? cars, boats  ? PE (pulmonary thromboembolism) ([place]) 2000  ? while on bed rest during pregnancy  ? PONV (postoperative nausea and vomiting)   ? Shortness of breath dyspnea   ? ASSOCIATED WITH TACHYCARDIA  ? Status post abdominal hysterectomy 07/02/2015  ? Status post TAH, bilateral salpingectomy. PATHOLOGY: Uterine fibroids and adenomyosis   ? Tachycardia   ? ? ?Past Surgical History:  ?Procedure Laterality Date  ? ABDOMINAL HYSTERECTOMY Bilateral 07/02/2015  ? Procedure: HYSTERECTOMY ABDOMINAL WITH BS;  Surgeon: Brayton Mars,  MD;  Location: [facility];  Service: Gynecology;  Laterality: Bilateral;  ? APPENDECTOMY    ? BIOPSY N/A 05/24/2020  ? Procedure: BIOPSY;  Surgeon: Lin Landsman, MD;  Location: [place];  Service: Endoscopy;  Laterality: N/A;  ? CARDIAC ELECTROPHYSIOLOGY STUDY AND ABLATION    ? CHOLECYSTECTOMY    ? COLONOSCOPY  2013  ? COLONOSCOPY WITH PROPOFOL N/A 03/10/2017  ? Procedure: COLONOSCOPY WITH PROPOFOL;  Surgeon: Lin Landsman, MD;  Location: [place];  Service: Endoscopy;  Laterality: N/A;  ? CYSTOSCOPY Bilateral 07/02/2015  ? Procedure: CYSTOSCOPY;  Surgeon: Brayton Mars, MD;  Location: [facility];  Service: Gynecology;  Laterality: Bilateral;  ? ESOPHAGEAL DILATION  03/10/2017  ? Procedure: ESOPHAGEAL DILATION;  Surgeon: Lin Landsman, MD;  Location: [place];  Service: Endoscopy;;  ? ESOPHAGEAL MANOMETRY N/A 07/24/2021  ? Procedure: ESOPHAGEAL MANOMETRY (EM);  Surgeon: Mauri Pole, MD;  Location: [facility];  Service: Endoscopy;  Laterality: N/A;  ? ESOPHAGOGASTRODUODENOSCOPY N/A 03/10/2017  ? Procedure: ESOPHAGOGASTRODUODENOSCOPY (EGD);  Surgeon: Lin Landsman, MD;  Location: [place];  Service: Endoscopy;  Laterality: N/A;  ? ESOPHAGOGASTRODUODENOSCOPY (EGD) WITH PROPOFOL N/A 07/01/2019  ? Procedure: ESOPHAGOGASTRODUODENOSCOPY (EGD) WITH PROPOFOL;  Surgeon: Lin Landsman, MD;  Location: [place];  Service: Endoscopy;  Laterality: N/A;  ? ESOPHAGOGASTRODUODENOSCOPY (EGD) WITH PROPOFOL N/A 05/24/2020  ? Procedure: ESOPHAGOGASTRODUODENOSCOPY (EGD) WITH PROPOFOL;  Surgeon: Sherri Sear  Reece Levy, MD;  Location: [place];  Service: Endoscopy;  Laterality: N/A;  ? LAPAROTOMY N/A 07/11/2015  ? Procedure: hematoma evacuation;  Surgeon: Brayton Mars, MD;  Location: [facility];  Service: Gynecology;  Laterality: N/A;  ? NISSEN FUNDOPLICATION    ? TUBAL LIGATION    ? ? ?Prior to Admission medications   ?Medication Sig  Start Date End Date Taking? Authorizing Provider  ?atorvastatin (LIPITOR) 10 MG tablet TAKE 1 TABLET(10 MG) BY MOUTH DAILY AT 6 PM 05/13/21   Glean Hess, MD  ?Cholecalciferol 50 MCG (2000 UT) CAPS Take 2,000 Units by mouth daily.    [provider]  ?diclofenac (VOLTAREN) 50 MG EC tablet Take 1 tablet (50 mg total) by mouth 2 (two) times daily as needed. 07/24/21   Montel Culver, MD  ?linaclotide Rolan Lipa) 145 MCG CAPS capsule Take 1 capsule (145 mcg total) by mouth daily before breakfast. ?Patient taking differently: Take 145 mcg by mouth as needed. 06/13/21   Lin Landsman, MD  ?methocarbamol (ROBAXIN) 500 MG tablet Take 1 tablet (500 mg total) by mouth every 8 (eight) hours as needed for muscle spasms. 07/24/21   Montel Culver, MD  ?Methylcobalamin (B12-ACTIVE PO) Take 1 tablet by mouth daily.    [provider]  ?Multiple Vitamins-Minerals (MULTIVITAMIN WITH MINERALS) tablet Take 1 tablet by mouth daily. ?Patient not taking: Reported on 07/24/2021    [provider]  ?nadolol (CORGARD) 40 MG tablet Take 40 mg by mouth 2 (two) times daily.    [provider]  ?propranolol (INDERAL) 20 MG tablet Take 1 tablet (20 mg total) by mouth as needed. 01/14/21   Glean Hess, MD  ? ? ?Current Outpatient Medications  ?Medication Sig Dispense Refill  ? atorvastatin (LIPITOR) 10 MG tablet TAKE 1 TABLET(10 MG) BY MOUTH DAILY AT 6 PM 90 tablet 0  ? Cholecalciferol 50 MCG (2000 UT) CAPS Take 2,000 Units by mouth daily.    ? diclofenac (VOLTAREN) 50 MG EC tablet Take 1 tablet (50 mg total) by mouth 2 (two) times daily as needed. 60 tablet 0  ? linaclotide (LINZESS) 145 MCG CAPS capsule Take 1 capsule (145 mcg total) by mouth daily before breakfast. (Patient taking differently: Take 145 mcg by mouth as needed.) 90 capsule 1  ? methocarbamol (ROBAXIN) 500 MG tablet Take 1 tablet (500 mg total) by mouth every 8 (eight) hours as needed for muscle spasms. 30 tablet 0  ?  Methylcobalamin (B12-ACTIVE PO) Take 1 tablet by mouth daily.    ? Multiple Vitamins-Minerals (MULTIVITAMIN WITH MINERALS) tablet Take 1 tablet by mouth daily. (Patient not taking: Reported on 07/24/2021)    ? nadolol (CORGARD) 40 MG tablet Take 40 mg by mouth 2 (two) times daily.    ? propranolol (INDERAL) 20 MG tablet Take 1 tablet (20 mg total) by mouth as needed. 90 tablet 1  ? ?No current facility-administered medications for this visit.  ? ? ?Allergies as of 08/22/2021 - Review Complete 08/22/2021  ?Allergen Reaction Noted  ? Codeine Shortness Of Breath 05/09/2015  ? Aspirin Other (See Comments) 05/09/2015  ? Diphenhydramine hcl Other (See Comments) 05/09/2015  ? Adhesive [tape] Rash 06/25/2015  ? Hydrocodone Rash 07/11/2021  ? ? ?Family History  ?Problem Relation Age of Onset  ? Diabetes Mother   ? Prostate cancer Father   ? Congestive Heart Failure Father   ? Heart failure Father   ? Cancer Neg Hx   ? ? ?Social History  ? ?  Socioeconomic History  ? Marital status: Married  ?  Spouse name: Stephaney Steven  ? Number of children: Not on file  ? Years of education: Not on file  ? Highest education level: Not on file  ?Occupational History  ? Not on file  ?Tobacco Use  ? Smoking status: Never  ? Smokeless tobacco: Never  ?Vaping Use  ? Vaping Use: Never used  ?Substance and Sexual Activity  ? Alcohol use: Not Currently  ? Drug use: Never  ? Sexual activity: Yes  ?  Partners: Male  ?  Birth control/protection: Surgical  ?Other Topics Concern  ? Not on file  ?Social History Narrative  ? Not on file  ? ?Social Determinants of Health  ? ?Financial Resource Strain: Not on file  ?Food Insecurity: Not on file  ?Transportation Needs: Not on file  ?Physical Activity: Not on file  ?Stress: Not on file  ?Social Connections: Not on file  ?Intimate Partner Violence: Not on file  ? ? ?Review of Systems: ? ?All other review of systems negative except as mentioned in the HPI. ? ?Physical Exam: ?Vital signs in last 24 hours: ?General:    Alert, NAD ?Lungs:  Clear .   ?Heart:  Regular rate and rhythm ?Abdomen:  Soft, nontender and nondistended. ?Neuro/Psych:  Alert and cooperative. Normal mood and affect. A and O x 3 ? ?Reviewed labs, radiolog

## 2021-08-22 NOTE — Progress Notes (Signed)
0925 Robinul 0.1 mg IV given due large amount of secretions upon assessment.  MD made aware, vss   

## 2021-08-22 NOTE — Telephone Encounter (Signed)
Spoke with the patient.  ?She reports she took a bite of rice and salmon. She realized that it was not moving down. She tried swallowing liquid, but eventually regurgitated. She went to Urgent Care. She was able to swallow some water there. She is afraid to eat or drink now. She says it hurts to swallow liquids.  ?I suggested she stick to liquids for now.  ?Please advise. ?

## 2021-08-22 NOTE — Progress Notes (Signed)
Called to room to assist during endoscopic procedure.  Patient ID and intended procedure confirmed with present staff. Received instructions for my participation in the procedure from the performing physician.  ? ?Capsule expiration date- 07/02/2022 ? ?Capsule ID number [id number] ? ?LES measurement: 39 ?(capsule placed 6 cm above LES) 33 ? ?Time of implant: 0948  ?

## 2021-08-23 NOTE — Telephone Encounter (Signed)
The Bravo Sensor is unlikely to cause any symptoms of dysphagia or worsening of dysphagia.  ?She has chronic functional dysphagia, work up including EGD and esophageal manometry is negative for any significant abnormality.  ?She was given Rx for viscous lidocaine to use as needed for discomfort.  ?

## 2021-08-23 NOTE — Telephone Encounter (Signed)
Forms printed and put on desk. ? ?KP

## 2021-08-24 NOTE — Telephone Encounter (Signed)
Requested Prescriptions  ?Pending Prescriptions Disp Refills  ?? atorvastatin (LIPITOR) 10 MG tablet [Pharmacy Med Name: ATORVASTATIN '10MG'$  TABLETS] 90 tablet 0  ?  Sig: TAKE 1 TABLET(10 MG) BY MOUTH DAILY AT 6 PM  ?  ? Cardiovascular:  Antilipid - Statins Failed - 08/22/2021 12:48 PM  ?  ?  Failed - Lipid Panel in normal range within the last 12 months  ?  Cholesterol, Total  ?Date Value Ref Range Status  ?09/03/2020 149 100 - 199 mg/dL Final  ? ?LDL Chol Calc (NIH)  ?Date Value Ref Range Status  ?09/03/2020 86 0 - 99 mg/dL Final  ? ?HDL  ?Date Value Ref Range Status  ?09/03/2020 50 >39 mg/dL Final  ? ?Triglycerides  ?Date Value Ref Range Status  ?09/03/2020 64 0 - 149 mg/dL Final  ? ?  ?  ?  Passed - Patient is not pregnant  ?  ?  Passed - Valid encounter within last 12 months  ?  Recent Outpatient Visits   ?      ? 1 month ago Greater trochanteric pain syndrome of right lower extremity  ? [facility] Montel Culver, MD  ? 1 month ago Pharyngitis, unspecified etiology  ? [facility] Glean Hess, MD  ? 1 month ago HSV-1 (herpes simplex virus 1) infection  ? [facility] Glean Hess, MD  ? 2 months ago Spondylosis of 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  ? [facility] Montel Culver, MD  ? 2 months ago Acute midline low back pain without sciatica  ? [facility] Glean Hess, MD  ?  ?  ?Future Appointments   ?        ? In 1 week Glean Hess, MD [facility], [place]  ?  ? ?  ?  ?  ? ?

## 2021-08-26 ENCOUNTER — Telehealth: Payer: Self-pay

## 2021-08-26 ENCOUNTER — Ambulatory Visit: Payer: PRIVATE HEALTH INSURANCE | Admitting: Physical Therapy

## 2021-08-26 ENCOUNTER — Encounter: Payer: PRIVATE HEALTH INSURANCE | Admitting: Physical Therapy

## 2021-08-26 NOTE — Telephone Encounter (Signed)
?  Follow up Call- ? ? ?  08/22/2021  ?  9:23 AM  ?Call back number  ?Post procedure Call Back phone  # [phone]  ?Permission to leave phone message Yes  ?  ? ?Patient questions: ? ?Do you have a fever, pain , or abdominal swelling? No. ?Pain Score  0 * ? ?Have you tolerated food without any problems? No. Patient tried to eat salmon and rice and said that it got stuck. She tried calling but no one called her back so she went to an urgent care facility. She finally was able to get some water down and was sent home. She was terrified to try and eat after this as well as try the viscous lidocaine. She called [place] but it took hours for anyone to call her back. She is upset that the Doctor did not call her back as she waited on this phone call. I advised her to eat soft foods that were easy to get down. I also encouraged her to try the lidocaine before eating and hopefully that would help. I also advised her to call us back with any other issues and she said she would.  ? ?Have you been able to return to your normal activities? Yes.   ? ?Do you have any questions about your discharge instructions: ?Diet   No. ?Medications  No. ?Follow up visit  No. ? ?Do you have questions or concerns about your Care? No. ? ?Actions: ?* If pain score is 4 or above: ?No action needed, pain <4. ? ?Have you developed a fever since your procedure? no ? ?2.   Have you had an respiratory symptoms (SOB or cough) since your procedure? no ? ?3.   Have you tested positive for COVID 19 since your procedure no ? ?4.   Have you had any family members/close contacts diagnosed with the COVID 19 since your procedure?  no ? ? ?If yes to any of these questions please route to Joylene John, RN and Joella Prince, RN ? ?

## 2021-08-27 ENCOUNTER — Telehealth: Payer: Self-pay

## 2021-08-27 NOTE — Telephone Encounter (Signed)
Copy of the esophageal manometry report faxed to [facility] for provider review. Report will be scanned in at [place] soon. ?

## 2021-08-28 ENCOUNTER — Ambulatory Visit: Payer: PRIVATE HEALTH INSURANCE | Admitting: Physical Therapy

## 2021-08-30 ENCOUNTER — Other Ambulatory Visit: Payer: Self-pay | Admitting: Family Medicine

## 2021-08-30 DIAGNOSIS — M47817 Spondylosis without myelopathy or radiculopathy, lumbosacral region: Secondary | ICD-10-CM

## 2021-09-02 ENCOUNTER — Encounter: Payer: PRIVATE HEALTH INSURANCE | Admitting: Physical Therapy

## 2021-09-02 NOTE — Telephone Encounter (Signed)
Requested medication (s) are due for refill today: Yes ? ?Requested medication (s) are on the active medication list: Yes ? ?Last refill:  07/24/21 ? ?Future visit scheduled:No ? ?Notes to clinic:  Unable to refill per protocol, last ordered #60, provider for approval for additional refills ? ? ? ? ?Requested Prescriptions  ?Pending Prescriptions Disp Refills  ? diclofenac (VOLTAREN) 50 MG EC tablet [Pharmacy Med Name: DICLOFENAC SODIUM 50MG DR TABLETS] 60 tablet 0  ?  Sig: TAKE 1 TABLET(50 MG) BY MOUTH TWICE DAILY AS NEEDED  ?  ? Analgesics:  NSAIDS Failed - 08/30/2021  1:47 PM  ?  ?  Failed - Manual Review: Labs are only required if the patient has taken medication for more than 8 weeks.  ?  ?  Failed - Cr in normal range and within 360 days  ?  Creatinine, Ser  ?Date Value Ref Range Status  ?09/03/2020 0.96 0.57 - 1.00 mg/dL Final  ?  ?  ?  ?  Failed - HGB in normal range and within 360 days  ?  Hemoglobin  ?Date Value Ref Range Status  ?09/03/2020 14.0 11.1 - 15.9 g/dL Final  ?  ?  ?  ?  Failed - PLT in normal range and within 360 days  ?  Platelets  ?Date Value Ref Range Status  ?09/03/2020 209 150 - 450 x10E3/uL Final  ?  ?  ?  ?  Failed - HCT in normal range and within 360 days  ?  Hematocrit  ?Date Value Ref Range Status  ?09/03/2020 41.0 34.0 - 46.6 % Final  ?  ?  ?  ?  Failed - eGFR is 30 or above and within 360 days  ?  GFR calc Af Amer  ?Date Value Ref Range Status  ?06/29/2019 80 >59 mL/min/1.73 Final  ? ?GFR calc non Af Amer  ?Date Value Ref Range Status  ?06/29/2019 70 >59 mL/min/1.73 Final  ? ?eGFR  ?Date Value Ref Range Status  ?09/03/2020 72 >59 mL/min/1.73 Final  ?  ?  ?  ?  Passed - Patient is not pregnant  ?  ?  Passed - Valid encounter within last 12 months  ?  Recent Outpatient Visits   ? ?      ? 1 month ago Greater trochanteric pain syndrome of right lower extremity  ? [facility] Montel Culver, MD  ? 1 month ago Pharyngitis, unspecified etiology  ? [facility]  Glean Hess, MD  ? 2 months ago HSV-1 (herpes simplex virus 1) infection  ? [facility] Glean Hess, MD  ? 2 months ago Spondylosis of 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  ? [facility] Montel Culver, MD  ? 3 months ago Acute midline low back pain without sciatica  ? [facility] Glean Hess, MD  ? ?  ?  ? ?  ?  ?  ? ? ? ? ?

## 2021-09-04 ENCOUNTER — Encounter: Payer: PRIVATE HEALTH INSURANCE | Admitting: Physical Therapy

## 2021-09-05 ENCOUNTER — Encounter: Payer: PRIVATE HEALTH INSURANCE | Admitting: Internal Medicine

## 2021-09-09 ENCOUNTER — Encounter: Payer: PRIVATE HEALTH INSURANCE | Admitting: Physical Therapy

## 2021-09-11 ENCOUNTER — Ambulatory Visit: Payer: PRIVATE HEALTH INSURANCE | Admitting: Physical Therapy

## 2021-09-16 ENCOUNTER — Encounter: Payer: PRIVATE HEALTH INSURANCE | Admitting: Physical Therapy

## 2021-09-18 ENCOUNTER — Encounter: Payer: PRIVATE HEALTH INSURANCE | Admitting: Physical Therapy

## 2021-09-24 ENCOUNTER — Encounter: Payer: Self-pay | Admitting: Family Medicine

## 2021-09-24 ENCOUNTER — Ambulatory Visit (INDEPENDENT_AMBULATORY_CARE_PROVIDER_SITE_OTHER): Payer: PRIVATE HEALTH INSURANCE | Admitting: Family Medicine

## 2021-09-24 VITALS — BP 126/76 | HR 78 | Ht 66.0 in | Wt 218.0 lb

## 2021-09-24 DIAGNOSIS — M25551 Pain in right hip: Secondary | ICD-10-CM | POA: Diagnosis not present

## 2021-09-24 DIAGNOSIS — M62838 Other muscle spasm: Secondary | ICD-10-CM | POA: Insufficient documentation

## 2021-09-24 DIAGNOSIS — M26623 Arthralgia of bilateral temporomandibular joint: Secondary | ICD-10-CM | POA: Diagnosis not present

## 2021-09-24 DIAGNOSIS — M47817 Spondylosis without myelopathy or radiculopathy, lumbosacral region: Secondary | ICD-10-CM

## 2021-09-24 HISTORY — DX: Arthralgia of bilateral temporomandibular joint: M26.623

## 2021-09-24 MED ORDER — MELOXICAM 15 MG PO TABS: 15.0000 mg | ORAL_TABLET | Freq: Every day | ORAL | 0 refills | Status: DC

## 2021-09-24 MED ORDER — CYCLOBENZAPRINE HCL 10 MG PO TABS: 10.0000 mg | ORAL_TABLET | Freq: Every evening | ORAL | 1 refills | Status: DC | PRN

## 2021-09-24 NOTE — Progress Notes (Signed)
?  ? ?Primary Care / Sports Medicine Office Visit ? ?Patient Information:  ?Patient ID: Karin Lieu, female DOB: 12-26-1970 Age: 51 y.o. MRN: [id number]  ? ?Zarai Orsborn is a pleasant 50 y.o. female presenting with the following: ? ?Chief Complaint  ?Patient presents with  ? Neck Pain  ?  Right/left sided neck and jaw pain for 1 week. Feels like tension  ? Back Pain  ?  States it isn't any better, about the same  ? ? ?Vitals:  ? 09/24/21 1558  ?BP: 126/76  ?Pulse: 78  ?SpO2: 98%  ? ?Vitals:  ? 09/24/21 1558  ?Weight: 218 lb (98.9 kg)  ?Height: '5\' 6"'$  (1.676 m)  ? ?Body mass index is 35.19 kg/m?. ? ?No results found.  ? ?Independent interpretation of notes and tests performed by another provider:  ? ?None ? ?Procedures performed:  ? ?None ? ?Pertinent History, Exam, Impression, and Recommendations:  ? ?Problem List Items Addressed This Visit   ? ?  ? Musculoskeletal and Integument  ? Spondylosis of 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 - Primary  ?  Chronic condition with ongoing symptomatology, did attend 1 formal session of physical therapy after initial evaluation, has not been dosing NSAIDs regularly.  Symptoms right greater than left with intermittent radiation to the right lower leg.  Did discuss adjunct treatment options such as corticosteroid injections which patient has deferred, does have plans to start physical therapy with a group that has hours that aligned with her scheduled better. ? ?  ?  ? Relevant Medications  ? meloxicam (MOBIC) 15 MG tablet  ? cyclobenzaprine (FLEXERIL) 10 MG tablet  ? Other Relevant Orders  ? Ambulatory referral to Physical Therapy  ? Greater trochanteric pain syndrome of right lower extremity  ? Relevant Medications  ? meloxicam (MOBIC) 15 MG tablet  ? cyclobenzaprine (FLEXERIL) 10 MG tablet  ? Other Relevant Orders  ? Ambulatory referral to Physical Therapy  ?  ? Other  ? Cervical paraspinal muscle spasm  ?  Issue noted over the past 1 week, atraumatic onset,  has noted progressive pain since she has had highly increased computer-based work/work from home.  Examination with painful full range of motion, tight paraspinal musculature symmetrically, negative Spurling's bilaterally, preserved sensorimotor function bilaterally. ? ?Findings are most consistent with cervical paraspinal muscle spasm in the setting of relative overuse/postural mechanics deficits.  I have advised formal physical therapy for this addition to as needed usage of cyclobenzaprine, scheduled use of meloxicam. ? ?  ?  ? Relevant Medications  ? meloxicam (MOBIC) 15 MG tablet  ? cyclobenzaprine (FLEXERIL) 10 MG tablet  ? Other Relevant Orders  ? Ambulatory referral to Physical Therapy  ? Bilateral temporomandibular joint pain  ?  1 week atraumatic bilateral jaw pain with examination showing focality to the left greater than right TMJ.  Patient does endorse significant recent life stressors (loss of family members).  I did advise the patient to touch base with her PCP Dr. Halina Maidens regarding this latter issue.  Over the interim NSAIDs and skeletal muscle relaxer provided which may afford some benefit. ? ?  ?  ? Relevant Medications  ? meloxicam (MOBIC) 15 MG tablet  ?  ?I provided a total time of 43 minutes including both face-to-face and non-face-to-face time on 09/24/2021 inclusive of time utilized for medical chart review, information gathering, care coordination with staff, and documentation completion.  Specifically we reviewed her overall clinical course, persistent symptomatology, additional interventional measures, need for medications  as an adjunct to physical therapy.  Most recent symptomatology and relation to recent life stressors.  Recommendation for PCP follow-up and medication management next steps. ? ? ?Orders & Medications ?Meds ordered this encounter  ?Medications  ? meloxicam (MOBIC) 15 MG tablet  ?  Sig: Take 1 tablet (15 mg total) by mouth daily.  ?  Dispense:  90 tablet  ?  Refill:   0  ? cyclobenzaprine (FLEXERIL) 10 MG tablet  ?  Sig: Take 1 tablet (10 mg total) by mouth at bedtime as needed for muscle spasms.  ?  Dispense:  30 tablet  ?  Refill:  1  ? ?Orders Placed This Encounter  ?Procedures  ? Ambulatory referral to Physical Therapy  ?  ? ?Return in about 8 weeks (around 11/19/2021).  ?  ? ?Montel Culver, MD ? ? Primary Care Sports Medicine ?[facility] ?[place]  ? ?

## 2021-09-24 NOTE — Assessment & Plan Note (Signed)
1 week atraumatic bilateral jaw pain with examination showing focality to the left greater than right TMJ.  Patient does endorse significant recent life stressors (loss of family members).  I did advise the patient to touch base with her PCP Dr. Halina Maidens regarding this latter issue.  Over the interim NSAIDs and skeletal muscle relaxer provided which may afford some benefit. ?

## 2021-09-24 NOTE — Assessment & Plan Note (Signed)
Issue noted over the past 1 week, atraumatic onset, has noted progressive pain since she has had highly increased computer-based work/work from home.  Examination with painful full range of motion, tight paraspinal musculature symmetrically, negative Spurling's bilaterally, preserved sensorimotor function bilaterally. ? ?Findings are most consistent with cervical paraspinal muscle spasm in the setting of relative overuse/postural mechanics deficits.  I have advised formal physical therapy for this addition to as needed usage of cyclobenzaprine, scheduled use of meloxicam. ?

## 2021-09-24 NOTE — Assessment & Plan Note (Signed)
Chronic condition with ongoing symptomatology, did attend 1 formal session of physical therapy after initial evaluation, has not been dosing NSAIDs regularly.  Symptoms right greater than left with intermittent radiation to the right lower leg.  Did discuss adjunct treatment options such as corticosteroid injections which patient has deferred, does have plans to start physical therapy with a group that has hours that aligned with her scheduled better. ?

## 2021-10-05 ENCOUNTER — Other Ambulatory Visit: Payer: Self-pay | Admitting: Internal Medicine

## 2021-10-05 DIAGNOSIS — M47817 Spondylosis without myelopathy or radiculopathy, lumbosacral region: Secondary | ICD-10-CM

## 2021-10-07 NOTE — Telephone Encounter (Signed)
Requested medication (s) are due for refill today: no ? ?Requested medication (s) are on the active medication list: no ? ?Last refill:  07/24/21 ? ?Future visit scheduled: yes ? ?Notes to clinic:  Unable to refill per protocol, Rx expired. Medication was discontinued 07/24/21. ? ? ?  ?Requested Prescriptions  ?Pending Prescriptions Disp Refills  ? diclofenac (VOLTAREN) 50 MG EC tablet [Pharmacy Med Name: DICLOFENAC SODIUM 50MG DR TABLETS] 60 tablet 0  ?  Sig: TAKE 1 TABLET(50 MG) BY MOUTH TWICE DAILY  ?  ? Analgesics:  NSAIDS Failed - 10/05/2021  9:11 AM  ?  ?  Failed - Manual Review: Labs are only required if the patient has taken medication for more than 8 weeks.  ?  ?  Failed - Cr in normal range and within 360 days  ?  Creatinine, Ser  ?Date Value Ref Range Status  ?09/03/2020 0.96 0.57 - 1.00 mg/dL Final  ?  ?  ?  ?  Failed - HGB in normal range and within 360 days  ?  Hemoglobin  ?Date Value Ref Range Status  ?09/03/2020 14.0 11.1 - 15.9 g/dL Final  ?  ?  ?  ?  Failed - PLT in normal range and within 360 days  ?  Platelets  ?Date Value Ref Range Status  ?09/03/2020 209 150 - 450 x10E3/uL Final  ?  ?  ?  ?  Failed - HCT in normal range and within 360 days  ?  Hematocrit  ?Date Value Ref Range Status  ?09/03/2020 41.0 34.0 - 46.6 % Final  ?  ?  ?  ?  Failed - eGFR is 30 or above and within 360 days  ?  GFR calc Af Amer  ?Date Value Ref Range Status  ?06/29/2019 80 >59 mL/min/1.73 Final  ? ?GFR calc non Af Amer  ?Date Value Ref Range Status  ?06/29/2019 70 >59 mL/min/1.73 Final  ? ?eGFR  ?Date Value Ref Range Status  ?09/03/2020 72 >59 mL/min/1.73 Final  ?  ?  ?  ?  Passed - Patient is not pregnant  ?  ?  Passed - Valid encounter within last 12 months  ?  Recent Outpatient Visits   ? ?      ? 1 week ago Spondylosis of 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  ? [facility] Montel Culver, MD  ? 2 months ago Greater trochanteric pain syndrome of right lower extremity  ? [facility] Montel Culver, MD  ? 2 months ago Pharyngitis, unspecified etiology  ? [facility] Glean Hess, MD  ? 3 months ago HSV-1 (herpes simplex virus 1) infection  ? [facility] Glean Hess, MD  ? 3 months ago Spondylosis of 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  ? [facility] Montel Culver, MD  ? ?  ?  ?Future Appointments   ? ?        ? In 1 month Zigmund Daniel, Earley Abide, MD [facility], [place]  ? ?  ? ? ?  ?  ?  ? ? ?

## 2021-10-10 ENCOUNTER — Encounter: Payer: Self-pay | Admitting: Family Medicine

## 2021-10-10 NOTE — Telephone Encounter (Signed)
I believe this is more for PCP, please advise. Thanks

## 2021-10-11 ENCOUNTER — Other Ambulatory Visit: Payer: Self-pay

## 2021-10-11 DIAGNOSIS — R4789 Other speech disturbances: Secondary | ICD-10-CM

## 2021-10-11 DIAGNOSIS — R4189 Other symptoms and signs involving cognitive functions and awareness: Secondary | ICD-10-CM

## 2021-10-18 NOTE — Telephone Encounter (Signed)
Please review. Pt wants to see if referral can be placed at another office.  KP

## 2021-11-14 ENCOUNTER — Encounter: Payer: Self-pay | Admitting: Neurology

## 2021-11-14 ENCOUNTER — Ambulatory Visit (INDEPENDENT_AMBULATORY_CARE_PROVIDER_SITE_OTHER): Payer: PRIVATE HEALTH INSURANCE | Admitting: Neurology

## 2021-11-14 VITALS — BP 120/75 | HR 80 | Ht 66.0 in | Wt 215.4 lb

## 2021-11-14 DIAGNOSIS — R479 Unspecified speech disturbances: Secondary | ICD-10-CM | POA: Diagnosis not present

## 2021-11-14 DIAGNOSIS — R404 Transient alteration of awareness: Secondary | ICD-10-CM

## 2021-11-14 MED ORDER — ALPRAZOLAM 1 MG PO TABS: 1.0000 mg | ORAL_TABLET | ORAL | 0 refills | Status: DC | PRN

## 2021-11-14 NOTE — Patient Instructions (Signed)
MRI brain Routine EEG Continue current medication Follow with your primary care doctor Follow-up in 3 months or sooner if worse

## 2021-11-14 NOTE — Progress Notes (Signed)
[facility]  PATIENT: Brittany Baldwin DOB: Apr 09, 1971  REQUESTING CLINICIAN: Glean Hess, MD HISTORY FROM: Patient  REASON FOR VISIT: Speech disturbance, and altered sensorium    HISTORICAL  CHIEF COMPLAINT:  Chief Complaint  Patient presents with   New Patient (Initial Visit)    Rm 20. Alone. NP internal referral for Brain fog, other speech disturbance.    HISTORY OF PRESENT ILLNESS:  This is a 51 year old woman past medical history of hyperlipidemia, tachycardia, who is presenting with episode of altered sensorium and speech disturbance.  Patient noticed the first episode in the end of April, she described as blood rushing into her head, feel lightheaded and and her speech was not clear, she said that the words were not coming out right, had trouble saying certain words and sometimes she will say the wrong word or the word is not coming out right.  She also reported episode of feeling flush feeling like panicky, and this happening a couple times per week.  Patient states that these episodes are different from hot flashes. She has been complaining of bilateral jaw pain described as tension type pain.  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  She describes increased stress since 2020, related to loss of family members, there is also stress related to her job.  She stated her home is good, her sleep is not perfect and is broken.  She reported she does exercise.  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   OTHER MEDICAL CONDITIONS: Hyperlipidemia, Tachycardia    REVIEW OF SYSTEMS: Full 14 system review of systems performed and negative with exception of:  as noted in the home   ALLERGIES: Allergies  Allergen Reactions   Codeine Shortness Of Breath   Aspirin Other (See Comments)    Stomach issues   Diphenhydramine Hcl Other (See Comments)    Through an I.V. Drip in addition to other meds. Caused  hallucinations   Adhesive [Tape] Rash    ELECTRODES FROM HOLTER MONITOR CAUSED RASH   Hydrocodone Rash    HOME MEDICATIONS: Outpatient Medications Prior to Visit  Medication Sig Dispense Refill   atorvastatin (LIPITOR) 10 MG tablet TAKE 1 TABLET(10 MG) BY MOUTH DAILY AT 6 PM 90 tablet 0   Cholecalciferol 50 MCG (2000 UT) CAPS Take 2,000 Units by mouth daily.     cyclobenzaprine (FLEXERIL) 10 MG tablet Take 1 tablet (10 mg total) by mouth at bedtime as needed for muscle spasms. 30 tablet 1   meloxicam (MOBIC) 15 MG tablet Take 1 tablet (15 mg total) by mouth daily. (Patient taking differently: Take 15 mg by mouth as needed.) 90 tablet 0   Methylcobalamin (B12-ACTIVE PO) Take 1 tablet by mouth daily.     nadolol (CORGARD) 40 MG tablet Take 40 mg by mouth 2 (two) times daily as needed.     propranolol (INDERAL) 20 MG tablet Take 1 tablet (20 mg total) by mouth as needed. 90 tablet 1   No facility-administered medications prior to visit.    PAST MEDICAL HISTORY: Past Medical History:  Diagnosis Date   Anemia    H/O    Dysrhythmia    SVT-HAD CARDIAC ABLATION DONE IN 2016 BUT PT STATES SHE STILL GETS INTERMITTENT TACHYCARDIC AND IS SYPMPTOMATIC WITH SOB, DIZZINESS DURING TACHY EPISODES (06-25-15)   Elevated parathyroid hormone 07/03/2016   Fibroid    GERD (gastroesophageal reflux disease)    Headache  H/O   Heart murmur    as child   History of fundoplication 37/85/8850   History of pulmonary embolus during pregnancy 05/16/2015   2001   Hoarseness, persistent 10/06/2017   Hypercalcemia 01/18/2017   PTH, PTHrP, 1,25 vit D and 25 vit D, Thyroid and Serum free light chains - normal  Repeat Calcium WNL   Hyperlipemia    Motion sickness    cars, boats   PE (pulmonary thromboembolism) ([place]) 2000   while on bed rest during pregnancy   PONV (postoperative nausea and vomiting)    Shortness of breath dyspnea    ASSOCIATED WITH TACHYCARDIA   Status post abdominal hysterectomy  07/02/2015   Status post TAH, bilateral salpingectomy. PATHOLOGY: Uterine fibroids and adenomyosis    Tachycardia     PAST SURGICAL HISTORY: Past Surgical History:  Procedure Laterality Date   ABDOMINAL HYSTERECTOMY Bilateral 07/02/2015   Procedure: HYSTERECTOMY ABDOMINAL WITH BS;  Surgeon: Brayton Mars, MD;  Location: [facility];  Service: Gynecology;  Laterality: Bilateral;   APPENDECTOMY     BIOPSY N/A 05/24/2020   Procedure: BIOPSY;  Surgeon: Lin Landsman, MD;  Location: [place];  Service: Endoscopy;  Laterality: N/A;   CARDIAC ELECTROPHYSIOLOGY STUDY AND ABLATION     CHOLECYSTECTOMY     COLONOSCOPY  2013   COLONOSCOPY WITH PROPOFOL N/A 03/10/2017   Procedure: COLONOSCOPY WITH PROPOFOL;  Surgeon: Lin Landsman, MD;  Location: [place];  Service: Endoscopy;  Laterality: N/A;   CYSTOSCOPY Bilateral 07/02/2015   Procedure: CYSTOSCOPY;  Surgeon: Brayton Mars, MD;  Location: [facility];  Service: Gynecology;  Laterality: Bilateral;   ESOPHAGEAL DILATION  03/10/2017   Procedure: ESOPHAGEAL DILATION;  Surgeon: Lin Landsman, MD;  Location: [place];  Service: Endoscopy;;   ESOPHAGEAL MANOMETRY N/A 07/24/2021   Procedure: ESOPHAGEAL MANOMETRY (EM);  Surgeon: Mauri Pole, MD;  Location: [facility];  Service: Endoscopy;  Laterality: N/A;   ESOPHAGOGASTRODUODENOSCOPY N/A 03/10/2017   Procedure: ESOPHAGOGASTRODUODENOSCOPY (EGD);  Surgeon: Lin Landsman, MD;  Location: [place];  Service: Endoscopy;  Laterality: N/A;   ESOPHAGOGASTRODUODENOSCOPY (EGD) WITH PROPOFOL N/A 07/01/2019   Procedure: ESOPHAGOGASTRODUODENOSCOPY (EGD) WITH PROPOFOL;  Surgeon: Lin Landsman, MD;  Location: [place];  Service: Endoscopy;  Laterality: N/A;   ESOPHAGOGASTRODUODENOSCOPY (EGD) WITH PROPOFOL N/A 05/24/2020   Procedure: ESOPHAGOGASTRODUODENOSCOPY (EGD) WITH PROPOFOL;  Surgeon: Lin Landsman, MD;  Location:  [place];  Service: Endoscopy;  Laterality: N/A;   LAPAROTOMY N/A 07/11/2015   Procedure: hematoma evacuation;  Surgeon: Brayton Mars, MD;  Location: [facility];  Service: Gynecology;  Laterality: N/A;   NISSEN FUNDOPLICATION     TUBAL LIGATION      FAMILY HISTORY: Family History  Problem Relation Age of Onset   Diabetes Mother    Prostate cancer Father    Congestive Heart Failure Father    Heart failure Father    Cancer Neg Hx     SOCIAL HISTORY: Social History   Socioeconomic History   Marital status: Married    Spouse name: Shatonia Hoots   Number of children: Not on file   Years of education: Not on file   Highest education level: Not on file  Occupational History   Not on file  Tobacco Use   Smoking status: Never   Smokeless tobacco: Never  Vaping Use   Vaping Use: Never used  Substance and Sexual Activity   Alcohol use: Not Currently   Drug use: Never  Sexual activity: Yes    Partners: Male    Birth control/protection: Surgical  Other Topics Concern   Not on file  Social History Narrative   Not on file   Social Determinants of Health   Financial Resource Strain: Not on file  Food Insecurity: Not on file  Transportation Needs: Not on file  Physical Activity: Not on file  Stress: Not on file  Social Connections: Not on file  Intimate Partner Violence: Not on file    PHYSICAL EXAM    GENERAL EXAM/CONSTITUTIONAL: Vitals:  Vitals:   11/14/21 1501  BP: 120/75  Pulse: 80  Weight: 215 lb 6.4 oz (97.7 kg)  Height: '5\' 6"'$  (1.676 m)   Body mass index is 34.77 kg/m. Wt Readings from Last 3 Encounters:  11/14/21 215 lb 6.4 oz (97.7 kg)  09/24/21 218 lb (98.9 kg)  08/22/21 209 lb (94.8 kg)   Patient is in no distress; well developed, nourished and groomed; neck is supple  EYES: Pupils round and reactive to light, Visual fields full to confrontation, Extraocular movements intacts,   MUSCULOSKELETAL: Gait, strength, tone, movements  noted in Neurologic exam below  NEUROLOGIC: MENTAL STATUS:      No data to display         awake, alert, oriented to person, place and time recent and remote memory intact normal attention and concentration language fluent, comprehension intact, naming intact fund of knowledge appropriate  CRANIAL NERVE:  2nd, 3rd, 4th, 6th - pupils equal and reactive to light, visual fields full to confrontation, extraocular muscles intact, no nystagmus 5th - facial sensation symmetric 7th - facial strength symmetric 8th - hearing intact 9th - palate elevates symmetrically, uvula midline 11th - shoulder shrug symmetric 12th - tongue protrusion midline  MOTOR:  normal bulk and tone, full strength in the BUE, BLE  SENSORY:  normal and symmetric to light touch, pinprick, temperature, vibration  COORDINATION:  finger-nose-finger, fine finger movements normal  REFLEXES:  deep tendon reflexes present and symmetric  GAIT/STATION:  normal   DIAGNOSTIC DATA (LABS, IMAGING, TESTING) - I reviewed patient records, labs, notes, testing and imaging myself where available.  Lab Results  Component Value Date   WBC 4.2 09/03/2020   HGB 14.0 09/03/2020   HCT 41.0 09/03/2020   MCV 87 09/03/2020   PLT 209 09/03/2020      Component Value Date/Time   NA 138 09/03/2020 0954   K 4.6 09/03/2020 0954   CL 102 09/03/2020 0954   CO2 24 09/03/2020 0954   GLUCOSE 103 (H) 09/03/2020 0954   GLUCOSE 125 (H) 12/05/2015 2027   BUN 7 09/03/2020 0954   CREATININE 0.96 09/03/2020 0954   CALCIUM 10.2 10/08/2020 0948   PROT 6.6 09/03/2020 0954   ALBUMIN 4.6 09/03/2020 0954   AST 22 09/03/2020 0954   ALT 21 09/03/2020 0954   ALKPHOS 53 09/03/2020 0954   BILITOT 0.7 09/03/2020 0954   GFRNONAA 70 06/29/2019 0943   GFRAA 80 06/29/2019 0943   Lab Results  Component Value Date   CHOL 149 09/03/2020   HDL 50 09/03/2020   LDLCALC 86 09/03/2020   TRIG 64 09/03/2020   CHOLHDL 3.0 09/03/2020   No  results found for: "HGBA1C" No results found for: "VITAMINB12" Lab Results  Component Value Date   TSH 3.170 09/03/2020      ASSESSMENT AND PLAN  51 y.o. year old female with history of hyperlipidemia and tachycardia who is presenting with episodes of altered sensorium, feeling flushed, stated that  these episodes are happening a couple times a week associated sometimes with speech disturbance.  At this point, it is unclear what is causing the patient's symptoms but I would like to order MRI brain for further evaluation and routine EEG to rule out seizures.  I will contact the patient to go over the results otherwise I will see her in 3 months for follow-up.  Advised her to come sooner if her symptoms worsen.  She is comfortable with plans   1. Altered sensorium   2. Speech disturbance, unspecified type      Patient Instructions  MRI brain Routine EEG Continue current medication Follow with your primary care doctor Follow-up in 3 months or sooner if worse  Orders Placed This Encounter  Procedures   MR BRAIN WO CONTRAST   EEG adult    Meds ordered this encounter  Medications   ALPRAZolam (XANAX) 1 MG tablet    Sig: Take 1 tablet (1 mg total) by mouth as needed for anxiety (To take prior to MRI).    Dispense:  2 tablet    Refill:  0    Return in about 3 months (around 02/14/2022).    Alric Ran, MD 11/14/2021, 9:28 PM  [facility] [address], [place], [place] [address] [phone]

## 2021-11-19 ENCOUNTER — Ambulatory Visit (INDEPENDENT_AMBULATORY_CARE_PROVIDER_SITE_OTHER): Payer: PRIVATE HEALTH INSURANCE | Admitting: Neurology

## 2021-11-19 DIAGNOSIS — R404 Transient alteration of awareness: Secondary | ICD-10-CM

## 2021-11-19 DIAGNOSIS — R4182 Altered mental status, unspecified: Secondary | ICD-10-CM

## 2021-11-20 IMAGING — DX DG CHEST 2V
2 series · 2 of 2 positions shown · non-contrast
Comparison: 4040

CLINICAL DATA: Dyspnea

EXAM:
CHEST - 2 VIEW

[chest pa]
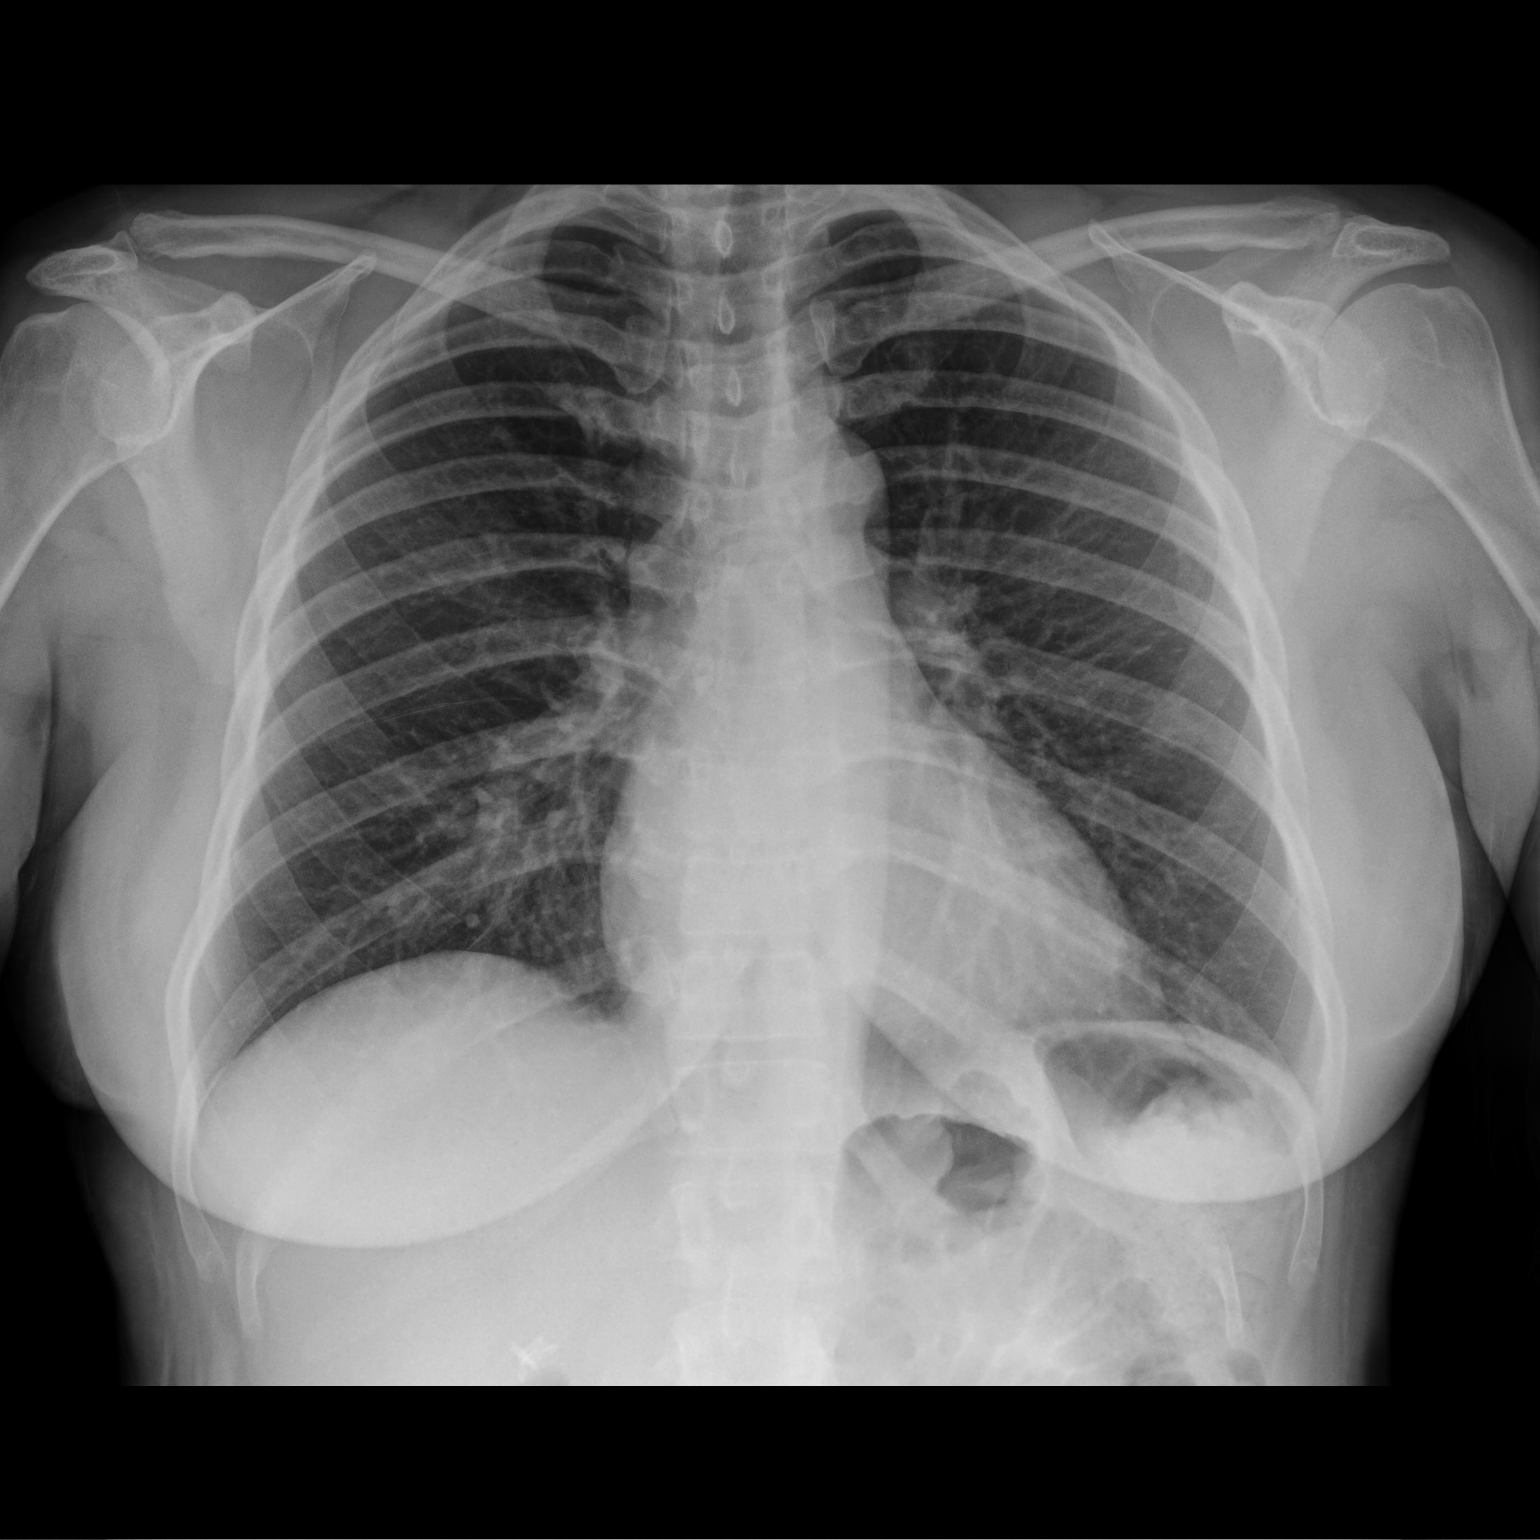

[chest lat]
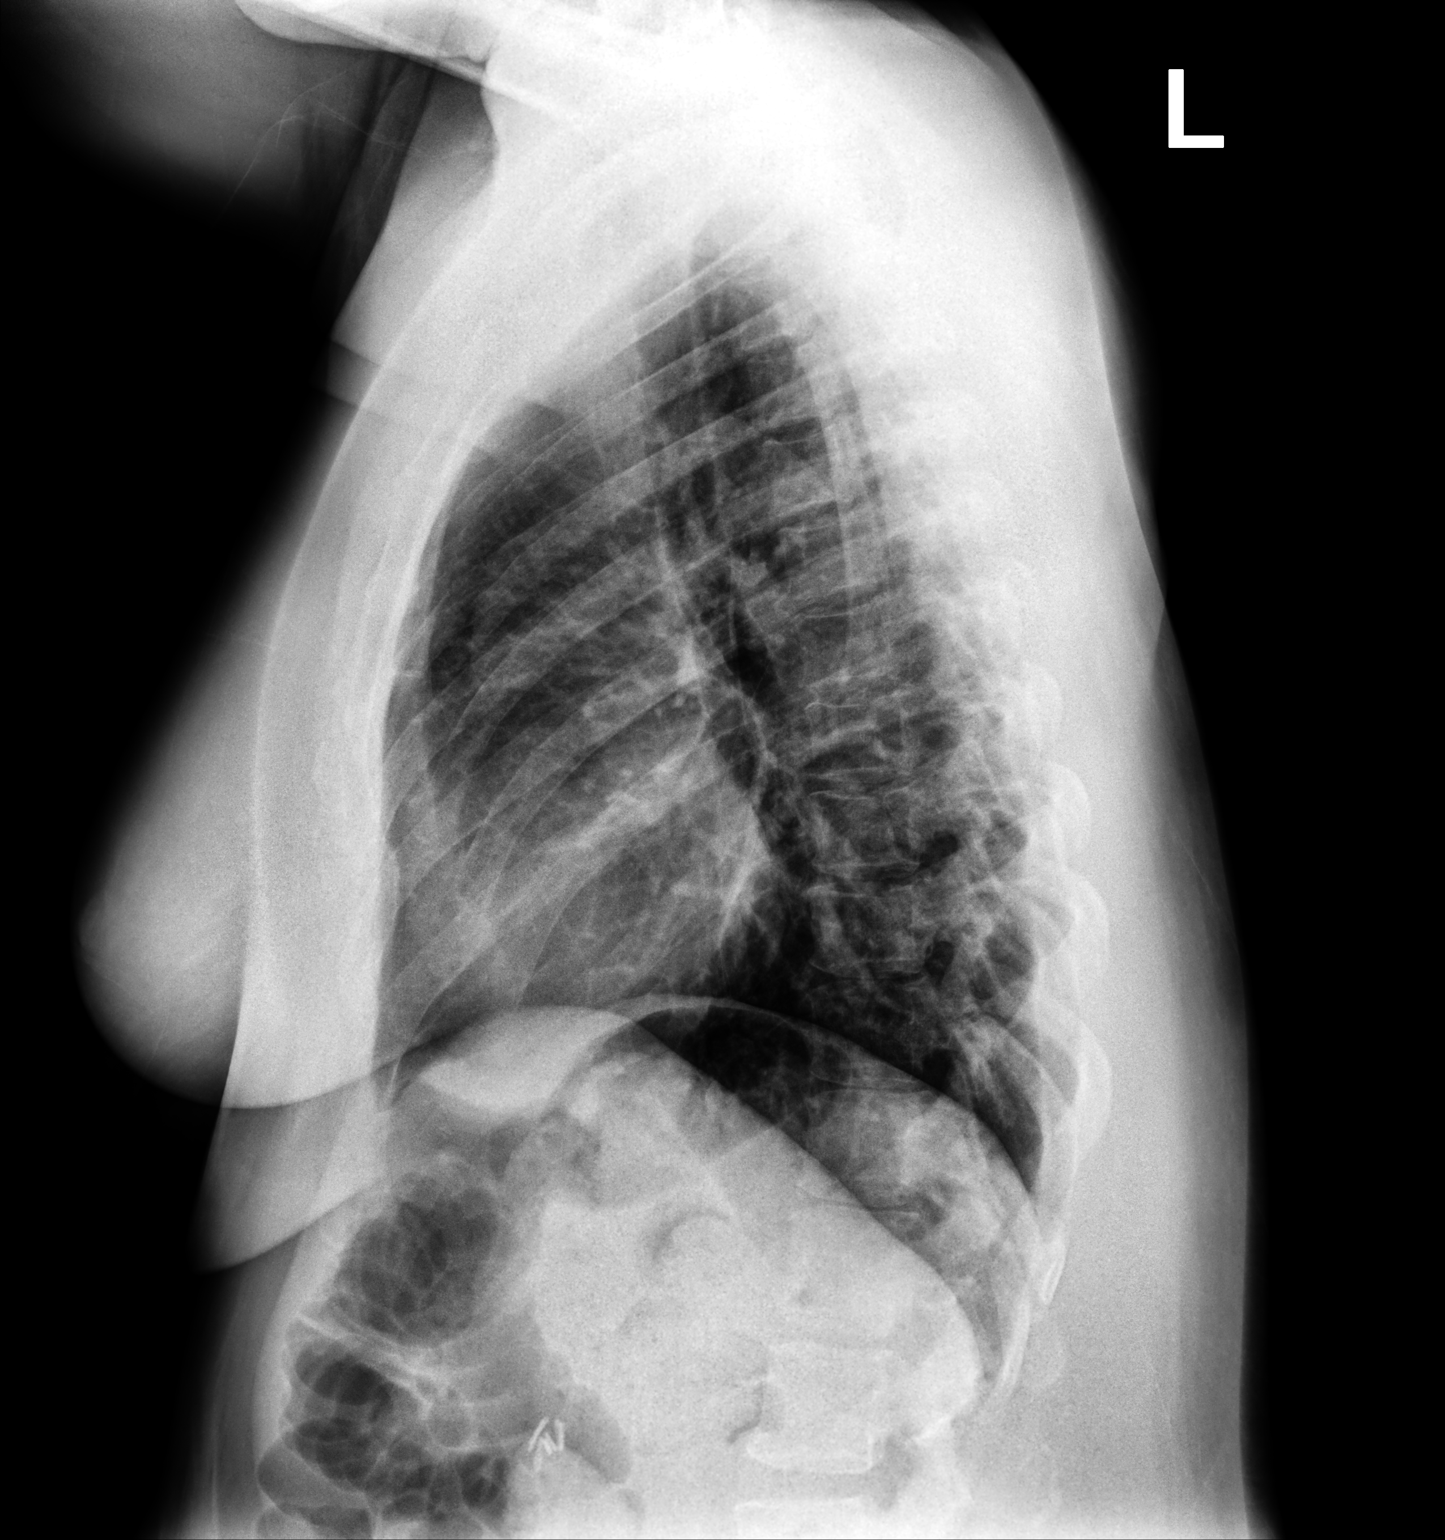

[2 of 2 positions shown; findings below may reference images not displayed]

FINDINGS: The heart size and mediastinal contours are within normal limits.
Both lungs are clear. No pleural effusion. The visualized skeletal
structures are unremarkable. Cholecystectomy clips.
IMPRESSION: No acute process in the chest.

## 2021-11-21 NOTE — Procedures (Signed)
    History:  51 year old woman with episode of altered sensorium   EEG classification: Awake and drowsy  Description of the recording: The background rhythms of this recording consists of a fairly well modulated medium amplitude alpha rhythm of 11 Hz that is reactive to eye opening and closure. As the record progresses, the patient appears to remain in the waking state throughout the recording. Photic stimulation was performed, did not show any abnormalities. Hyperventilation was also performed, did not show any abnormalities. Toward the end of the recording, the patient enters the drowsy state with slight symmetric slowing seen. The patient never enters stage II sleep. No abnormal epileptiform discharges seen during this recording. There was no focal slowing. EKG monitor shows no evidence of cardiac rhythm abnormalities with a heart rate of 54.  Abnormality: None   Impression: This is a normal EEG recording in the waking and drowsy state. No evidence of interictal epileptiform discharges seen. A normal EEG does not exclude a diagnosis of epilepsy.    Alric Ran, MD [facility]

## 2021-11-23 ENCOUNTER — Other Ambulatory Visit: Payer: Self-pay | Admitting: Internal Medicine

## 2021-11-23 DIAGNOSIS — E782 Mixed hyperlipidemia: Secondary | ICD-10-CM

## 2021-11-25 ENCOUNTER — Ambulatory Visit: Payer: PRIVATE HEALTH INSURANCE | Admitting: Family Medicine

## 2021-11-25 NOTE — Telephone Encounter (Signed)
Requested medications are due for refill today.  yes  Requested medications are on the active medications list.  yes  Last refill. 08/24/2021 #90 0 refills  Future visit scheduled.   yes  Notes to clinic.  Medication refill failed protocol d/t expired labs.    Requested Prescriptions  Pending Prescriptions Disp Refills   atorvastatin (LIPITOR) 10 MG tablet [Pharmacy Med Name: ATORVASTATIN 10MG  TABLETS] 90 tablet 0    Sig: TAKE 1 TABLET(10 MG) BY MOUTH DAILY AT 6 PM     Cardiovascular:  Antilipid - Statins Failed - 11/23/2021  3:31 AM      Failed - Lipid Panel in normal range within the last 12 months    Cholesterol, Total  Date Value Ref Range Status  09/03/2020 149 100 - 199 mg/dL Final   LDL Chol Calc (NIH)  Date Value Ref Range Status  09/03/2020 86 0 - 99 mg/dL Final   HDL  Date Value Ref Range Status  09/03/2020 50 >39 mg/dL Final   Triglycerides  Date Value Ref Range Status  09/03/2020 64 0 - 149 mg/dL Final         Passed - Patient is not pregnant      Passed - Valid encounter within last 12 months    Recent Outpatient Visits           2 months ago Spondylosis of 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   [facility] Jerrol Banana, MD   4 months ago Greater trochanteric pain syndrome of right lower extremity   [facility] Jerrol Banana, MD   4 months ago Pharyngitis, unspecified etiology   [facility] Reubin Milan, MD   4 months ago HSV-1 (herpes simplex virus 1) infection   [facility] Reubin Milan, MD   5 months ago Spondylosis of 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   [facility] Jerrol Banana, MD       Future Appointments             Tomorrow Jerrol Banana, MD [facility]

## 2021-11-26 ENCOUNTER — Telehealth: Payer: Self-pay | Admitting: Neurology

## 2021-11-26 ENCOUNTER — Encounter: Payer: Self-pay | Admitting: Family Medicine

## 2021-11-26 ENCOUNTER — Ambulatory Visit (INDEPENDENT_AMBULATORY_CARE_PROVIDER_SITE_OTHER): Payer: PRIVATE HEALTH INSURANCE | Admitting: Family Medicine

## 2021-11-26 VITALS — BP 122/78 | HR 88 | Ht 69.0 in | Wt 219.0 lb

## 2021-11-26 DIAGNOSIS — M25551 Pain in right hip: Secondary | ICD-10-CM

## 2021-11-26 DIAGNOSIS — M47817 Spondylosis without myelopathy or radiculopathy, lumbosacral region: Secondary | ICD-10-CM

## 2021-11-26 NOTE — Assessment & Plan Note (Signed)
Patient for follow-up to chronic lumbosacral pain.  Since last visit she has attended roughly 4 sessions of physical therapy total, dosed meloxicam and cyclobenzaprine sporadically.  She states that her symptoms are essentially unchanged and examination findings shows persistent tenderness to the lumbar sacral paraspinal region, additionally tender at the right greater trochanter, provocative testing otherwise reassuring.  Given her essentially unchanged symptomatology, I did offer further evaluation by interventional pain and spine, she has deferred this option instead requesting to continue with PT and dose meloxicam regularly.  I did advise discontinuation of cyclobenzaprine given comorbid reported "brain fog".  As she is transitioning PT groups, a new referral was provided today as well as a work from home working.  She will contact us towards the end of her current meloxicam Rx to provide a status update and determine next steps.

## 2021-11-26 NOTE — Assessment & Plan Note (Signed)
See additional assessment(s) for plan details. 

## 2021-11-26 NOTE — Progress Notes (Signed)
Primary Care / Sports Medicine Office Visit  Patient Information:  Patient ID: Brittany Baldwin, female DOB: 06-11-1970 Age: 51 y.o. MRN: [id number]   Brittany Baldwin is a pleasant 51 y.o. female presenting with the following:  Chief Complaint  Patient presents with   Spondylosis of lumbosacral region without myelopathy or rad    Pt states that it is feeling the same, no relief.     Vitals:   11/26/21 1550  BP: 122/78  Pulse: 88  SpO2: 98%   Vitals:   11/26/21 1550  Weight: 219 lb (99.3 kg)  Height: 5\' 9"  (1.753 m)   Body mass index is 32.34 kg/m.  EEG adult  Result Date: 11/19/2021 Brittany Norfolk, MD     11/21/2021  7:32 AM History: 51 year old woman with episode of altered sensorium EEG classification: Awake and drowsy Description of the recording: The background rhythms of this recording consists of a fairly well modulated medium amplitude alpha rhythm of 11 Hz that is reactive to eye opening and closure. As the record progresses, the patient appears to remain in the waking state throughout the recording. Photic stimulation was performed, did not show any abnormalities. Hyperventilation was also performed, did not show any abnormalities. Toward the end of the recording, the patient enters the drowsy state with slight symmetric slowing seen. The patient never enters stage II sleep. No abnormal epileptiform discharges seen during this recording. There was no focal slowing. EKG monitor shows no evidence of cardiac rhythm abnormalities with a heart rate of 54. Abnormality: None Impression: This is a normal EEG recording in the waking and drowsy state. No evidence of interictal epileptiform discharges seen. A normal EEG does not exclude a diagnosis of epilepsy. Brittany Norfolk, MD [facility]      Independent interpretation of notes and tests performed by another provider:   None  Procedures performed:   None  Pertinent History, Exam, Impression, and  Recommendations:   Problem List Items Addressed This Visit       Musculoskeletal and Integument   Spondylosis of 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 - Primary    Patient for follow-up to chronic lumbosacral pain.  Since last visit she has attended roughly 4 sessions of physical therapy total, dosed meloxicam and cyclobenzaprine sporadically.  She states that her symptoms are essentially unchanged and examination findings shows persistent tenderness to the lumbar sacral paraspinal region, additionally tender at the right greater trochanter, provocative testing otherwise reassuring.  Given her essentially unchanged symptomatology, I did offer further evaluation by interventional pain and spine, she has deferred this option instead requesting to continue with PT and dose meloxicam regularly.  I did advise discontinuation of cyclobenzaprine given comorbid reported "brain fog".  As she is transitioning PT groups, a new referral was provided today as well as a work from home working.  She will contact us towards the end of her current meloxicam Rx to provide a status update and determine next steps.      Relevant Orders   Ambulatory referral to Physical Therapy   Greater trochanteric pain syndrome of right lower extremity    See additional assessment(s) for plan details.      Relevant Orders   Ambulatory referral to Physical Therapy     Orders & Medications No orders of the defined types were placed in this encounter.  Orders Placed This Encounter  Procedures   Ambulatory referral to Physical Therapy     Return if symptoms worsen or fail  to improve.     Brittany Banana, MD   Primary Care Sports Medicine [facility]

## 2021-11-27 ENCOUNTER — Encounter: Payer: Self-pay | Admitting: Neurology

## 2021-12-04 ENCOUNTER — Inpatient Hospital Stay: Admission: RE | Admit: 2021-12-04 | Payer: PRIVATE HEALTH INSURANCE | Source: Ambulatory Visit

## 2021-12-11 ENCOUNTER — Ambulatory Visit
Admission: RE | Admit: 2021-12-11 | Discharge: 2021-12-11 | Disposition: A | Discharge status: Home / Self Care | Payer: PRIVATE HEALTH INSURANCE | Source: Ambulatory Visit | Attending: Neurology | Admitting: Neurology

## 2021-12-11 DIAGNOSIS — R404 Transient alteration of awareness: Secondary | ICD-10-CM

## 2021-12-16 ENCOUNTER — Encounter: Payer: Self-pay | Admitting: Neurology

## 2021-12-16 ENCOUNTER — Telehealth: Payer: Self-pay | Admitting: Neurology

## 2021-12-16 NOTE — Telephone Encounter (Signed)
PT is calling about MRI results. Would like a return call from thee nurse.

## 2021-12-22 ENCOUNTER — Other Ambulatory Visit: Payer: Self-pay | Admitting: Family Medicine

## 2021-12-22 DIAGNOSIS — M26623 Arthralgia of bilateral temporomandibular joint: Secondary | ICD-10-CM

## 2021-12-22 DIAGNOSIS — M25551 Pain in right hip: Secondary | ICD-10-CM

## 2021-12-22 DIAGNOSIS — M47817 Spondylosis without myelopathy or radiculopathy, lumbosacral region: Secondary | ICD-10-CM

## 2021-12-22 DIAGNOSIS — M62838 Other muscle spasm: Secondary | ICD-10-CM

## 2021-12-24 NOTE — Telephone Encounter (Signed)
Requested medication (s) are due for refill today: yes  Requested medication (s) are on the active medication list: yes  Last refill:  09/24/21 #90  Future visit scheduled: no  Notes to clinic:  overdue lab work   Requested Prescriptions  Pending Prescriptions Disp Refills   meloxicam ([place]) 15 MG tablet [Pharmacy Med Name: MELOXICAM 15MG TABLETS] 90 tablet 0    Sig: TAKE 1 TABLET(15 MG) BY MOUTH DAILY     Analgesics:  COX2 Inhibitors Failed - 12/22/2021 11:07 AM      Failed - Manual Review: Labs are only required if the patient has taken medication for more than 8 weeks.      Failed - HGB in normal range and within 360 days    Hemoglobin  Date Value Ref Range Status  09/03/2020 14.0 11.1 - 15.9 g/dL Final         Failed - Cr in normal range and within 360 days    Creatinine, Ser  Date Value Ref Range Status  09/03/2020 0.96 0.57 - 1.00 mg/dL Final         Failed - HCT in normal range and within 360 days    Hematocrit  Date Value Ref Range Status  09/03/2020 41.0 34.0 - 46.6 % Final         Failed - AST in normal range and within 360 days    AST  Date Value Ref Range Status  09/03/2020 22 0 - 40 IU/L Final         Failed - ALT in normal range and within 360 days    ALT  Date Value Ref Range Status  09/03/2020 21 0 - 32 IU/L Final         Failed - eGFR is 30 or above and within 360 days    GFR calc Af Amer  Date Value Ref Range Status  06/29/2019 80 >59 mL/min/1.73 Final   GFR calc non Af Amer  Date Value Ref Range Status  06/29/2019 70 >59 mL/min/1.73 Final   eGFR  Date Value Ref Range Status  09/03/2020 72 >59 mL/min/1.73 Final         Passed - Patient is not pregnant      Passed - Valid encounter within last 12 months    Recent Outpatient Visits           4 weeks ago Spondylosis of 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   [facility] Montel Culver, MD   3 months ago Spondylosis of lumbosacral region without  myelopathy or radiculopathy   [facility] Montel Culver, MD   5 months ago Greater trochanteric pain syndrome of right lower extremity   [facility] Montel Culver, MD   5 months ago Pharyngitis, unspecified etiology   [facility] Glean Hess, MD   5 months ago HSV-1 (herpes simplex virus 1) infection   [facility] Glean Hess, MD

## 2022-01-08 ENCOUNTER — Other Ambulatory Visit: Payer: Self-pay

## 2022-01-08 ENCOUNTER — Encounter: Payer: Self-pay | Admitting: Family Medicine

## 2022-01-08 DIAGNOSIS — M25551 Pain in right hip: Secondary | ICD-10-CM

## 2022-01-08 DIAGNOSIS — M47817 Spondylosis without myelopathy or radiculopathy, lumbosacral region: Secondary | ICD-10-CM

## 2022-01-13 NOTE — Telephone Encounter (Signed)
Forms are in your basket

## 2022-02-10 NOTE — Telephone Encounter (Signed)
Can you assist with the referral?

## 2022-02-21 ENCOUNTER — Other Ambulatory Visit: Payer: Self-pay | Admitting: Internal Medicine

## 2022-02-21 DIAGNOSIS — E782 Mixed hyperlipidemia: Secondary | ICD-10-CM

## 2022-02-21 NOTE — Telephone Encounter (Signed)
Unable to refill per protocol, medication was refilled 02/21/22 for 30 days. Duplicate request.Receipt confirmed by pharmacy (02/21/2022 11:25 AM . Will refuse.  Requested Prescriptions  Pending Prescriptions Disp Refills  . atorvastatin (LIPITOR) 10 MG tablet [Pharmacy Med Name: ATORVASTATIN '10MG'$  TABLETS] 90 tablet     Sig: TAKE 1 TABLET(10 MG) BY MOUTH DAILY AT 6 PM     Cardiovascular:  Antilipid - Statins Failed - 02/21/2022 11:25 AM      Failed - Lipid Panel in normal range within the last 12 months    Cholesterol, Total  Date Value Ref Range Status  09/03/2020 149 100 - 199 mg/dL Final   LDL Chol Calc (NIH)  Date Value Ref Range Status  09/03/2020 86 0 - 99 mg/dL Final   HDL  Date Value Ref Range Status  09/03/2020 50 >39 mg/dL Final   Triglycerides  Date Value Ref Range Status  09/03/2020 64 0 - 149 mg/dL Final         Passed - Patient is not pregnant      Passed - Valid encounter within last 12 months    Recent Outpatient Visits          2 months ago Spondylosis of 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   [facility] [place], Earley Abide, MD   5 months ago Spondylosis of 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   [facility] [place], Earley Abide, MD   7 months ago Greater trochanteric pain syndrome of right lower extremity   [place] [place], Earley Abide, MD   7 months ago Pharyngitis, unspecified etiology   [place] [facility], Jesse Sans, MD   7 months ago HSV-1 (herpes simplex virus 1) infection   [facility], Jesse Sans, MD

## 2022-02-21 NOTE — Telephone Encounter (Signed)
Requested medication (s) are due for refill today: yes  Requested medication (s) are on the active medication list: yes  Last refill:  11/25/21 #90 0 refills  Future visit scheduled: no   Notes to clinic:   protocol failed. Last labs 09/03/20. Do you want to refill Rx?     Requested Prescriptions  Pending Prescriptions Disp Refills   atorvastatin (LIPITOR) 10 MG tablet [Pharmacy Med Name: ATORVASTATIN '10MG'$  TABLETS] 90 tablet 0    Sig: TAKE 1 TABLET(10 MG) BY MOUTH DAILY AT 6 PM     Cardiovascular:  Antilipid - Statins Failed - 02/21/2022  3:32 AM      Failed - Lipid Panel in normal range within the last 12 months    Cholesterol, Total  Date Value Ref Range Status  09/03/2020 149 100 - 199 mg/dL Final   LDL Chol Calc (NIH)  Date Value Ref Range Status  09/03/2020 86 0 - 99 mg/dL Final   HDL  Date Value Ref Range Status  09/03/2020 50 >39 mg/dL Final   Triglycerides  Date Value Ref Range Status  09/03/2020 64 0 - 149 mg/dL Final         Passed - Patient is not pregnant      Passed - Valid encounter within last 12 months    Recent Outpatient Visits           2 months ago Spondylosis of 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   [place] [facility] [place], Earley Abide, MD   5 months ago Spondylosis of 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   [place] [facility] [place], Earley Abide, MD   7 months ago Greater trochanteric pain syndrome of right lower extremity   [place] [facility] [place], Earley Abide, MD   7 months ago Pharyngitis, unspecified etiology   [place] [facility] at [facility], Jesse Sans, MD   7 months ago HSV-1 (herpes simplex virus 1) infection   [place] [facility] at [facility], Jesse Sans, MD

## 2022-03-18 ENCOUNTER — Other Ambulatory Visit: Payer: Self-pay | Admitting: Internal Medicine

## 2022-03-18 DIAGNOSIS — E782 Mixed hyperlipidemia: Secondary | ICD-10-CM

## 2022-03-19 ENCOUNTER — Other Ambulatory Visit: Payer: Self-pay | Admitting: Internal Medicine

## 2022-03-19 DIAGNOSIS — E782 Mixed hyperlipidemia: Secondary | ICD-10-CM

## 2022-03-19 NOTE — Telephone Encounter (Signed)
Requested medication (s) are due for refill today: Due 03/23/22  Requested medication (s) are on the active medication list: yes    Last refill: 02/21/22 #30  0 refills  Future visit scheduled no  Notes to clinic:Failed due to labs, please review. Thank you.  Requested Prescriptions  Pending Prescriptions Disp Refills   atorvastatin (LIPITOR) 10 MG tablet [Pharmacy Med Name: ATORVASTATIN '10MG'$  TABLETS] 30 tablet 0    Sig: TAKE 1 TABLET(10 MG) BY MOUTH DAILY AT 6 PM     Cardiovascular:  Antilipid - Statins Failed - 03/18/2022  6:21 PM      Failed - Lipid Panel in normal range within the last 12 months    Cholesterol, Total  Date Value Ref Range Status  09/03/2020 149 100 - 199 mg/dL Final   LDL Chol Calc (NIH)  Date Value Ref Range Status  09/03/2020 86 0 - 99 mg/dL Final   HDL  Date Value Ref Range Status  09/03/2020 50 >39 mg/dL Final   Triglycerides  Date Value Ref Range Status  09/03/2020 64 0 - 149 mg/dL Final         Passed - Patient is not pregnant      Passed - Valid encounter within last 12 months    Recent Outpatient Visits           3 months ago Spondylosis of 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   [place] [facility] [place], Earley Abide, MD   5 months ago Spondylosis of 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   [place] [facility] [place], Earley Abide, MD   7 months ago Greater trochanteric pain syndrome of right lower extremity   [place] [facility] [place], Earley Abide, MD   8 months ago Pharyngitis, unspecified etiology   [place] [facility] at [facility], Jesse Sans, MD   8 months ago HSV-1 (herpes simplex virus 1) infection   [place] [facility] at [facility], Jesse Sans, MD

## 2022-03-19 NOTE — Telephone Encounter (Signed)
Requested medication (s) are due for refill today:   Yes  Requested medication (s) are on the active medication list:   Yes  Future visit scheduled:   No   Last ordered: 03/19/2022 #30, 0 refills  Returned because a 90 day supply is being requested and Lipid panel is due.   It was prescribed today 10/18   Requested Prescriptions  Pending Prescriptions Disp Refills   atorvastatin (LIPITOR) 10 MG tablet [Pharmacy Med Name: ATORVASTATIN '10MG'$  TABLETS] 90 tablet     Sig: TAKE 1 TABLET(10 MG) BY MOUTH DAILY AT 6 PM     Cardiovascular:  Antilipid - Statins Failed - 03/19/2022  8:28 AM      Failed - Lipid Panel in normal range within the last 12 months    Cholesterol, Total  Date Value Ref Range Status  09/03/2020 149 100 - 199 mg/dL Final   LDL Chol Calc (NIH)  Date Value Ref Range Status  09/03/2020 86 0 - 99 mg/dL Final   HDL  Date Value Ref Range Status  09/03/2020 50 >39 mg/dL Final   Triglycerides  Date Value Ref Range Status  09/03/2020 64 0 - 149 mg/dL Final         Passed - Patient is not pregnant      Passed - Valid encounter within last 12 months    Recent Outpatient Visits           3 months ago Spondylosis of 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   [place] [facility] [place], Earley Abide, MD   5 months ago Spondylosis of 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   [place] [facility] at [place], Earley Abide, MD   7 months ago Greater trochanteric pain syndrome of right lower extremity   [place] [facility] at [place], Earley Abide, MD   8 months ago Pharyngitis, unspecified etiology   [place] [facility] at [facility], Jesse Sans, MD   8 months ago HSV-1 (herpes simplex virus 1) infection   [place] [facility] at [facility], Jesse Sans, MD

## 2022-04-08 IMAGING — CR DG HIP (WITH OR WITHOUT PELVIS) 2-3V*L*
3 series · 3 of 3 positions shown · non-contrast
Comparison: CT abdomen pelvis 03/19/2017.

CLINICAL DATA: Left hip pain for several weeks.

EXAM:
DG HIP (WITH OR WITHOUT PELVIS) 2-3V LEFT

[pelvis ap]
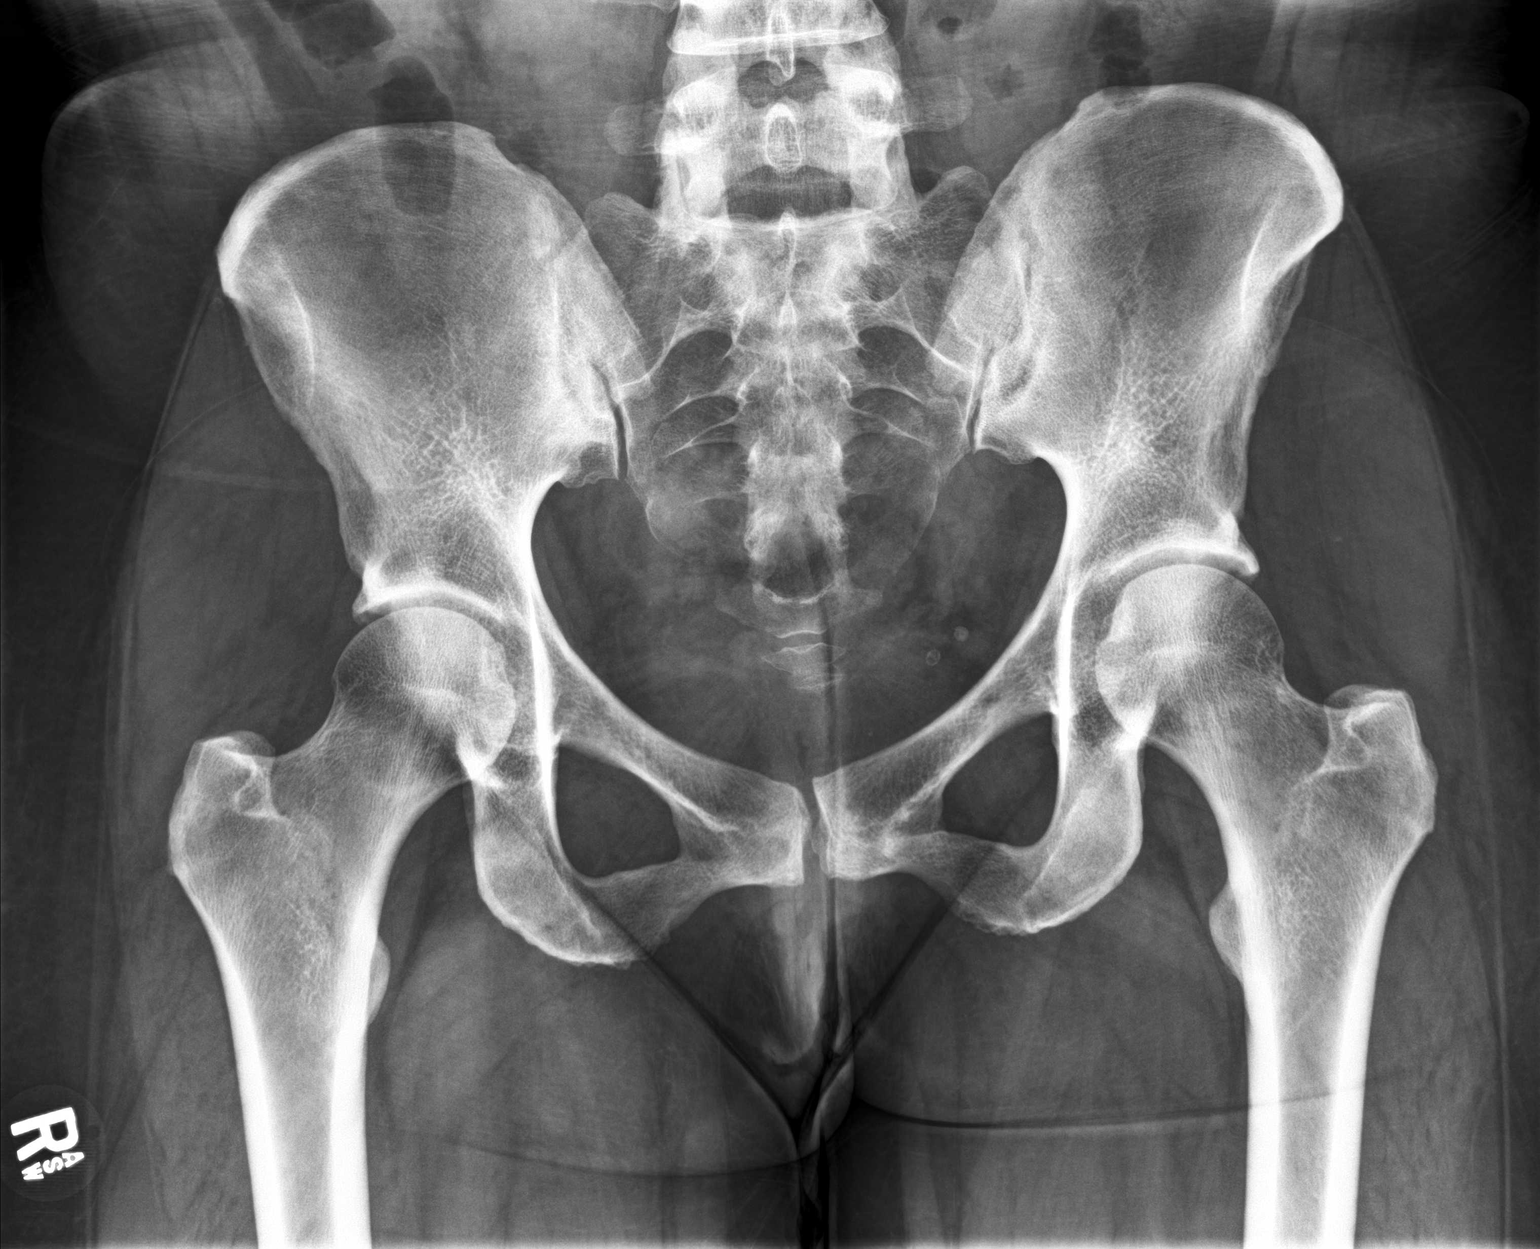

[hip ap]
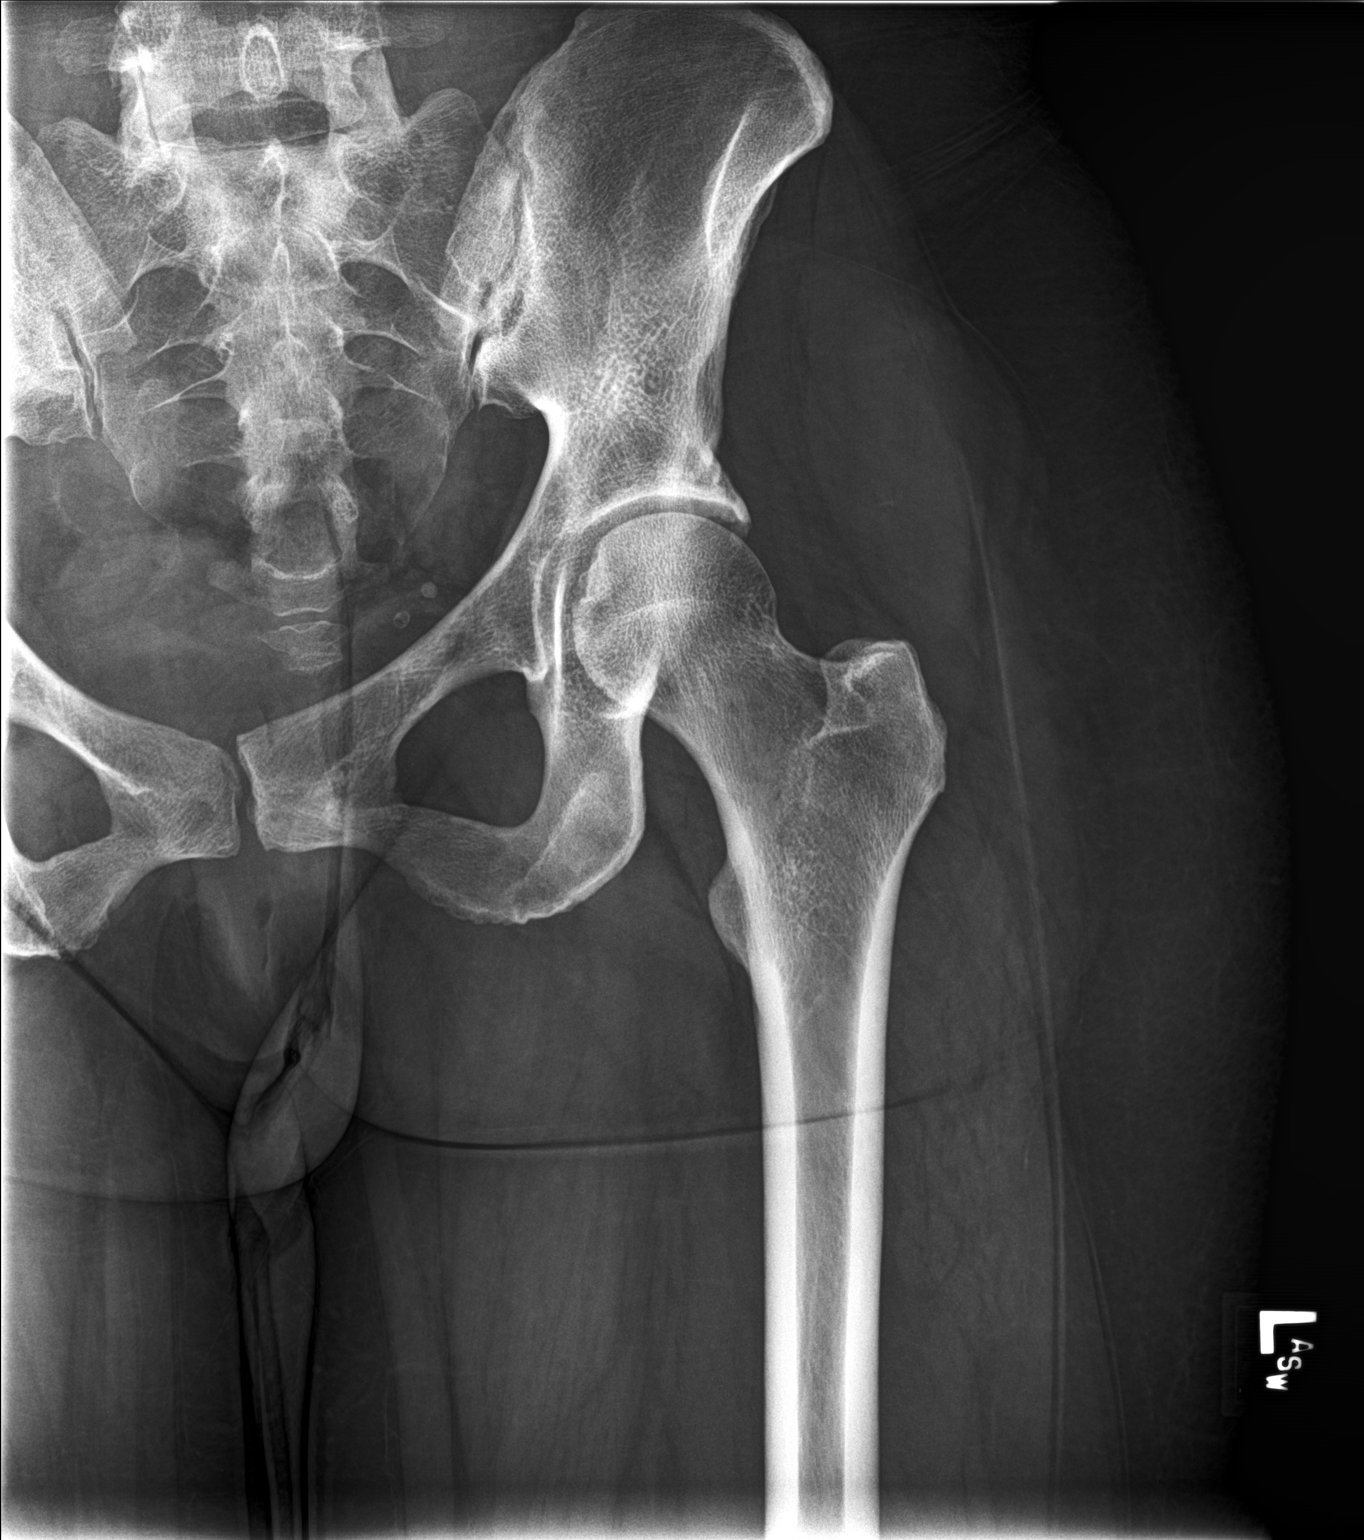

[hip lat]
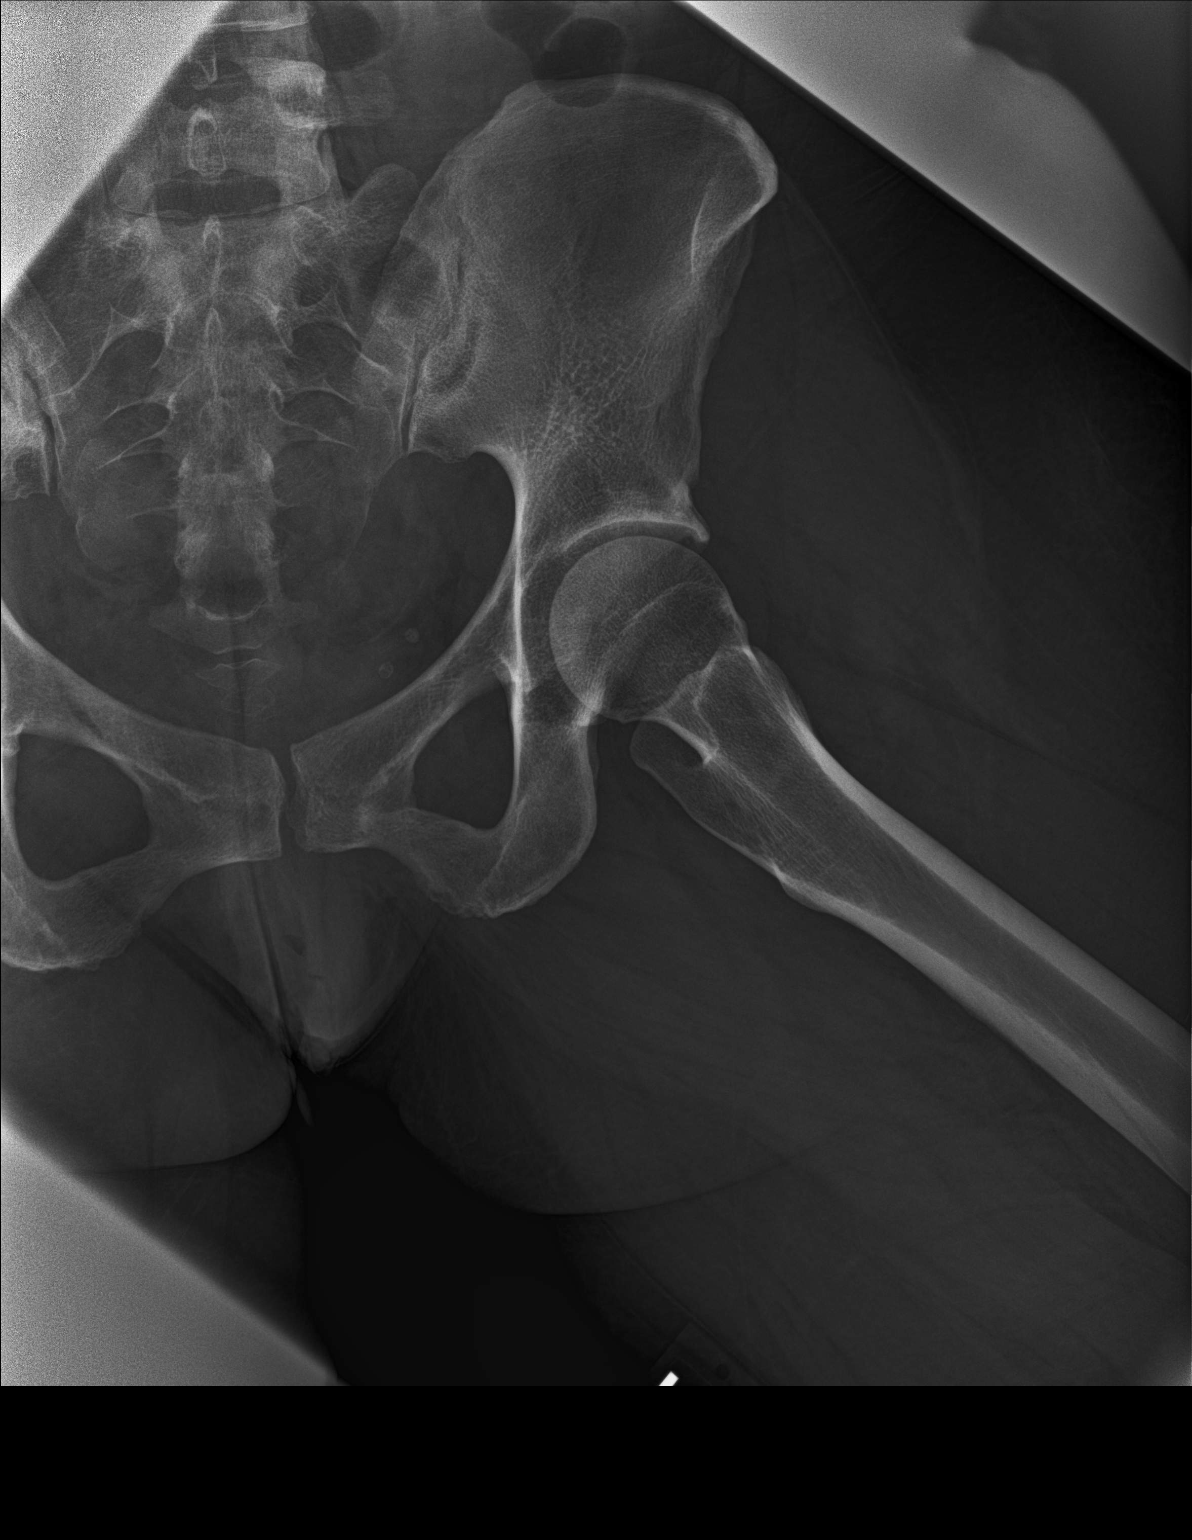

[3 of 3 positions shown; findings below may reference images not displayed]

FINDINGS: Hip joint space is maintained bilaterally. There may be minimal
osteophytosis along the lateral acetabular roof on the left.
Sacroiliac joints are patent.
IMPRESSION: Question minimal acetabular osteophytosis in the left hip.
Otherwise, unremarkable exam.

## 2022-04-10 ENCOUNTER — Ambulatory Visit (INDEPENDENT_AMBULATORY_CARE_PROVIDER_SITE_OTHER): Payer: PRIVATE HEALTH INSURANCE | Admitting: Family Medicine

## 2022-04-10 ENCOUNTER — Encounter: Payer: Self-pay | Admitting: Family Medicine

## 2022-04-10 VITALS — BP 122/80 | HR 86 | Ht 66.0 in | Wt 216.0 lb

## 2022-04-10 DIAGNOSIS — J00 Acute nasopharyngitis [common cold]: Secondary | ICD-10-CM | POA: Diagnosis not present

## 2022-04-10 DIAGNOSIS — J3089 Other allergic rhinitis: Secondary | ICD-10-CM | POA: Insufficient documentation

## 2022-04-10 MED ORDER — AZITHROMYCIN 250 MG PO TABS: ORAL_TABLET | ORAL | 0 refills | Status: AC

## 2022-04-10 MED ORDER — AZELASTINE HCL 0.1 % NA SOLN: 2.0000 | Freq: Two times a day (BID) | NASAL | 1 refills | Status: DC

## 2022-04-10 MED ORDER — PROMETHAZINE-DM 6.25-15 MG/5ML PO SYRP: 5.0000 mL | ORAL_SOLUTION | Freq: Four times a day (QID) | ORAL | 0 refills | Status: DC | PRN

## 2022-04-10 NOTE — Patient Instructions (Signed)
-   Dose nasal spray scheduled x 3 days then as-needed - Use cough medicine as-needed - If still symptomatic without improvement by Monday, take antibiotics for full course - Recommend Mucinex twice daily x 7 days then as-needed - Recommend vitamins C & D - Contact if symptoms persist despite the above

## 2022-04-10 NOTE — Assessment & Plan Note (Signed)
Symptom onset Monday, 04/07/2022 in the setting of visiting family members.  Symptoms began with severe sore throat, cough productive of copious yellowish sputum which turned darker, most recently lightened.  Significant fatigue though endorses cough has been extremely disruptive of sleep, Tmax 100.2 earlier this week, denies whole body myalgias, no ear pain, facial pressure, mild congestion, no shortness of air or breathing difficulties otherwise, no abdominal pain or GI change.  Examination with benign cardiac sounds, lung fields are clear to air entry throughout without wheezes, rales, rhonchi, O2 sats at baseline, tender left cervical chain lymph node, tympanic membranes with fluid posteriorly, no erythema, canals benign, nontender sinuses, erythematous turbinates, cobblestone pattern at the posterior oropharynx without exudate.  Findings are most consistent with acute rhinitis, nonspecific etiology was relayed with the patient.  Given the duration of symptoms, clinical course, I have advised primarily supportive care with antitussive, Astelin spray, OTC medications, and reserved usage of antibiotics for persistent symptoms 7 days after onset (which would be 04/14/2022).  She is to remain out of work until next week and contact our office for any persistent symptoms despite stepwise treatment regimen.

## 2022-04-10 NOTE — Progress Notes (Signed)
     Primary Care / Sports Medicine Office Visit  Patient Information:  Patient ID: Brittany Baldwin, female DOB: 11-Jul-1970 Age: 51 y.o. MRN: [id number]   Brittany Baldwin is a pleasant 51 y.o. female presenting with the following:  Chief Complaint  Patient presents with   URI    Pt has had cough, chest burning, body aches and sore throat since Monday. Cough is painful and is productive. Did have low grade fever on Tuesday, 2 at home covid test negative     Vitals:   04/10/22 1330  BP: 122/80  Pulse: 86  SpO2: 98%   Vitals:   04/10/22 1330  Weight: 216 lb (98 kg)  Height: '5\' 6"'$  (1.676 m)   Body mass index is 34.86 kg/m.  No results found.   Independent interpretation of notes and tests performed by another provider:   None  Procedures performed:   None  Pertinent History, Exam, Impression, and Recommendations:   Problem List Items Addressed This Visit       Respiratory   Acute rhinitis - Primary    Symptom onset Monday, 04/07/2022 in the setting of visiting family members.  Symptoms began with severe sore throat, cough productive of copious yellowish sputum which turned darker, most recently lightened.  Significant fatigue though endorses cough has been extremely disruptive of sleep, Tmax 100.2 earlier this week, denies whole body myalgias, no ear pain, facial pressure, mild congestion, no shortness of air or breathing difficulties otherwise, no abdominal pain or GI change.  Examination with benign cardiac sounds, lung fields are clear to air entry throughout without wheezes, rales, rhonchi, O2 sats at baseline, tender left cervical chain lymph node, tympanic membranes with fluid posteriorly, no erythema, canals benign, nontender sinuses, erythematous turbinates, cobblestone pattern at the posterior oropharynx without exudate.  Findings are most consistent with acute rhinitis, nonspecific etiology was relayed with the patient.  Given the duration of symptoms, clinical course,  I have advised primarily supportive care with antitussive, Astelin spray, OTC medications, and reserved usage of antibiotics for persistent symptoms 7 days after onset (which would be 04/14/2022).  She is to remain out of work until next week and contact our office for any persistent symptoms despite stepwise treatment regimen.      Relevant Medications   promethazine-dextromethorphan (PROMETHAZINE-DM) 6.25-15 MG/5ML syrup   azelastine (ASTELIN) 0.1 % nasal spray   azithromycin (ZITHROMAX) 250 MG tablet     Orders & Medications Meds ordered this encounter  Medications   promethazine-dextromethorphan (PROMETHAZINE-DM) 6.25-15 MG/5ML syrup    Sig: Take 5 mLs by mouth 4 (four) times daily as needed for cough.    Dispense:  118 mL    Refill:  0   azelastine (ASTELIN) 0.1 % nasal spray    Sig: Place 2 sprays into both nostrils 2 (two) times daily. Use in each nostril as directed    Dispense:  301 mL    Refill:  1    Use generic Astelin   azithromycin (ZITHROMAX) 250 MG tablet    Sig: Take 2 tablets on day 1, then 1 tablet daily on days 2 through 5    Dispense:  6 tablet    Refill:  0   No orders of the defined types were placed in this encounter.    No follow-ups on file.     Montel Culver, MD   [facility] [place]

## 2022-04-11 ENCOUNTER — Ambulatory Visit: Payer: PRIVATE HEALTH INSURANCE | Admitting: Internal Medicine

## 2022-04-23 ENCOUNTER — Other Ambulatory Visit: Payer: Self-pay | Admitting: Internal Medicine

## 2022-04-23 DIAGNOSIS — E782 Mixed hyperlipidemia: Secondary | ICD-10-CM

## 2022-04-23 NOTE — Telephone Encounter (Signed)
Requested medication (s) are due for refill today:   Yes  Requested medication (s) are on the active medication list:   Yes  Future visit scheduled:   No     Last ordered: 03/19/2022 #30, 0 refills  Returned because lipid panel is due per protocol.      Requested Prescriptions  Pending Prescriptions Disp Refills   atorvastatin (LIPITOR) 10 MG tablet [Pharmacy Med Name: ATORVASTATIN '10MG'$  TABLETS] 30 tablet 0    Sig: TAKE 1 TABLET(10 MG) BY MOUTH DAILY AT 6 PM     Cardiovascular:  Antilipid - Statins Failed - 04/23/2022  3:35 AM      Failed - Lipid Panel in normal range within the last 12 months    Cholesterol, Total  Date Value Ref Range Status  09/03/2020 149 100 - 199 mg/dL Final   LDL Chol Calc (NIH)  Date Value Ref Range Status  09/03/2020 86 0 - 99 mg/dL Final   HDL  Date Value Ref Range Status  09/03/2020 50 >39 mg/dL Final   Triglycerides  Date Value Ref Range Status  09/03/2020 64 0 - 149 mg/dL Final         Passed - Patient is not pregnant      Passed - Valid encounter within last 12 months    Recent Outpatient Visits           1 week ago Acute rhinitis   [place] [facility] [place], Earley Abide, MD   4 months ago Spondylosis of 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   [place] [facility] [place], Earley Abide, MD   7 months ago Spondylosis of 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   [place] [facility] at [facility], Earley Abide, MD   9 months ago Greater trochanteric pain syndrome of right lower extremity   [place] [facility] at [facility], Earley Abide, MD   9 months ago Pharyngitis, unspecified etiology   [facility] at [facility], Jesse Sans, MD

## 2022-07-02 ENCOUNTER — Ambulatory Visit
Admission: EM | Admit: 2022-07-02 | Discharge: 2022-07-02 | Disposition: A | Discharge status: Home / Self Care | Payer: Federal, State, Local not specified - PPO | Attending: Family Medicine | Admitting: Family Medicine

## 2022-07-02 DIAGNOSIS — R059 Cough, unspecified: Secondary | ICD-10-CM | POA: Diagnosis not present

## 2022-07-02 DIAGNOSIS — Z1152 Encounter for screening for COVID-19: Secondary | ICD-10-CM | POA: Insufficient documentation

## 2022-07-02 DIAGNOSIS — J069 Acute upper respiratory infection, unspecified: Secondary | ICD-10-CM

## 2022-07-02 DIAGNOSIS — R0789 Other chest pain: Secondary | ICD-10-CM | POA: Diagnosis not present

## 2022-07-02 DIAGNOSIS — R140 Abdominal distension (gaseous): Secondary | ICD-10-CM | POA: Diagnosis not present

## 2022-07-02 LAB — RESP PANEL BY RT-PCR (RSV, FLU A&B, COVID)  RVPGX2
Influenza A by PCR: NEGATIVE
Influenza B by PCR: NEGATIVE
Resp Syncytial Virus by PCR: NEGATIVE
SARS Coronavirus 2 by RT PCR: NEGATIVE

## 2022-07-02 LAB — GROUP A STREP BY PCR: Group A Strep by PCR: NOT DETECTED

## 2022-07-02 NOTE — ED Provider Notes (Signed)
[facility]    CSN: [id number] Arrival date & time: 07/02/22  1915      History   Chief Complaint Chief Complaint  Patient presents with   Cough   Nasal Congestion    HPI Brittany Baldwin is a 52 y.o. female.   HPI   Brittany Baldwin presents for sore throat, productive cough with yellow and nasal congestion that started yesterday. She drank some tea and felt somewhat better. Has rhinorrhea, chest tightness after coughing, bloating, decreased appetite, sinus pressure. Denies fever, nausea, vomiting, diarrhea, dysuria, vaginal discomfort or discharge, headache. Home COVID test was negative today.  Tried OTC meds without relief.   History no history of asthma or known history of COPD. She is not a smoker.     Past Medical History:  Diagnosis Date   Anemia    H/O    COPD (chronic obstructive pulmonary disease) ([place])    Dysrhythmia    SVT-HAD CARDIAC ABLATION DONE IN 2016 BUT PT STATES SHE STILL GETS INTERMITTENT TACHYCARDIC AND IS SYPMPTOMATIC WITH SOB, DIZZINESS DURING TACHY EPISODES (06-25-15)   Elevated parathyroid hormone 07/03/2016   Fibroid    GERD (gastroesophageal reflux disease)    Headache    H/O   Heart murmur    as child   History of fundoplication [id number]/8101   History of pulmonary embolus during pregnancy 05/16/2015   2001   Hoarseness, persistent 10/06/2017   Hypercalcemia 01/18/2017   PTH, PTHrP, 1,25 vit D and 25 vit D, Thyroid and Serum free light chains - normal  Repeat Calcium WNL   Hyperlipemia    Motion sickness    cars, boats   PE (pulmonary thromboembolism) ([place]) 2000   while on bed rest during pregnancy   PONV (postoperative nausea and vomiting)    Shortness of breath dyspnea    ASSOCIATED WITH TACHYCARDIA   Status post abdominal hysterectomy 07/02/2015   Status post TAH, bilateral salpingectomy. PATHOLOGY: Uterine fibroids and adenomyosis    Tachycardia     Patient Active Problem List   Diagnosis Date Noted   Acute rhinitis 04/10/2022    Cervical paraspinal muscle spasm 09/24/2021   Bilateral temporomandibular joint pain 09/24/2021   Greater trochanteric pain syndrome of right lower extremity 07/25/2021   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   Spondylosis of 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 06/14/2021   Osteophyte of left hip 04/19/2020   Acanthosis nigricans 07/30/2018   Esophageal dysphagia    Functional dyspepsia 02/20/2017   Dependent edema 08/05/2016   Plantar fasciitis, bilateral 08/05/2016   Constipation by delayed colonic transit 08/05/2016   Vocal cord nodules 02/13/2016   S/P total hysterectomy 07/02/2015   Tachycardia 05/10/2015   Gastroesophageal reflux disease 03/28/2014   Hyperlipidemia, mixed 03/28/2014    Past Surgical History:  Procedure Laterality Date   ABDOMINAL HYSTERECTOMY Bilateral 07/02/2015   Procedure: HYSTERECTOMY ABDOMINAL WITH BS;  Surgeon: Brayton Mars, MD;  Location: [facility];  Service: Gynecology;  Laterality: Bilateral;   APPENDECTOMY     BIOPSY N/A 05/24/2020   Procedure: BIOPSY;  Surgeon: Lin Landsman, MD;  Location: [place];  Service: Endoscopy;  Laterality: N/A;   CARDIAC ELECTROPHYSIOLOGY STUDY AND ABLATION     CHOLECYSTECTOMY     COLONOSCOPY  2013   COLONOSCOPY WITH PROPOFOL N/A 03/10/2017   Procedure: COLONOSCOPY WITH PROPOFOL;  Surgeon: Lin Landsman, MD;  Location: [place];  Service: Endoscopy;  Laterality: N/A;   CYSTOSCOPY Bilateral 07/02/2015   Procedure: CYSTOSCOPY;  Surgeon: Brayton Mars, MD;  Location: [facility];  Service: Gynecology;  Laterality: Bilateral;   ESOPHAGEAL DILATION  03/10/2017   Procedure: ESOPHAGEAL DILATION;  Surgeon: Lin Landsman, MD;  Location: [place];  Service: Endoscopy;;   ESOPHAGEAL MANOMETRY N/A 07/24/2021   Procedure: ESOPHAGEAL MANOMETRY (EM);  Surgeon: Mauri Pole, MD;  Location: [facility];  Service: Endoscopy;  Laterality:  N/A;   ESOPHAGOGASTRODUODENOSCOPY N/A 03/10/2017   Procedure: ESOPHAGOGASTRODUODENOSCOPY (EGD);  Surgeon: Lin Landsman, MD;  Location: [place];  Service: Endoscopy;  Laterality: N/A;   ESOPHAGOGASTRODUODENOSCOPY (EGD) WITH PROPOFOL N/A 07/01/2019   Procedure: ESOPHAGOGASTRODUODENOSCOPY (EGD) WITH PROPOFOL;  Surgeon: Lin Landsman, MD;  Location: [place];  Service: Endoscopy;  Laterality: N/A;   ESOPHAGOGASTRODUODENOSCOPY (EGD) WITH PROPOFOL N/A 05/24/2020   Procedure: ESOPHAGOGASTRODUODENOSCOPY (EGD) WITH PROPOFOL;  Surgeon: Lin Landsman, MD;  Location: [place];  Service: Endoscopy;  Laterality: N/A;   LAPAROTOMY N/A 07/11/2015   Procedure: hematoma evacuation;  Surgeon: Brayton Mars, MD;  Location: [facility];  Service: Gynecology;  Laterality: N/A;   NISSEN FUNDOPLICATION     TUBAL LIGATION      OB History     Gravida  8   Para  3   Term  1   Preterm  2   AB  5   Living  1      SAB  4   IAB  1   Ectopic      Multiple      Live Births  1            Home Medications    Prior to Admission medications   Medication Sig Start Date End Date Taking? Authorizing Provider  Cholecalciferol 50 MCG (2000 UT) CAPS Take 2,000 Units by mouth daily.   Yes [provider]  nadolol (CORGARD) 40 MG tablet Take by mouth. 05/02/22  Yes [provider]  propranolol (INDERAL) 20 MG tablet Take 1 tablet (20 mg total) by mouth as needed. 01/14/21  Yes Glean Hess, MD  atorvastatin (LIPITOR) 10 MG tablet TAKE 1 TABLET(10 MG) BY MOUTH DAILY AT 6 PM 04/23/22   Glean Hess, MD  azelastine (ASTELIN) 0.1 % nasal spray Place 2 sprays into both nostrils 2 (two) times daily. Use in each nostril as directed 04/10/22   Montel Culver, MD  promethazine-dextromethorphan (PROMETHAZINE-DM) 6.25-15 MG/5ML syrup Take 5 mLs by mouth 4 (four) times daily as needed for cough. 04/10/22   Montel Culver, MD    Family  History Family History  Problem Relation Age of Onset   Diabetes Mother    Prostate cancer Father    Congestive Heart Failure Father    Heart failure Father    Cancer Neg Hx     Social History Social History   Tobacco Use   Smoking status: Never   Smokeless tobacco: Never  Vaping Use   Vaping Use: Never used  Substance Use Topics   Alcohol use: Not Currently   Drug use: Never     Allergies   Codeine, Aspirin, Diphenhydramine hcl, Adhesive [tape], and Hydrocodone   Review of Systems Review of Systems: negative unless otherwise stated in HPI.      Physical Exam Triage Vital Signs ED Triage Vitals  Enc Vitals Group     BP 07/02/22 1927 (!) 114/90     Pulse Rate 07/02/22 1927 88     Resp --      Temp 07/02/22 1927  98.4 F (36.9 C)     Temp Source 07/02/22 1927 Oral     SpO2 07/02/22 1927 100 %     Weight 07/02/22 1925 215 lb (97.5 kg)     Height 07/02/22 1925 '5\' 6"'$  (1.676 m)     Head Circumference --      Peak Flow --      Pain Score 07/02/22 1924 5     Pain Loc --      Pain Edu? --      Excl. in [place]? --    No data found.  Updated Vital Signs BP (!) 114/90 (BP Location: Left Arm)   Pulse 88   Temp 98.4 F (36.9 C) (Oral)   Ht '5\' 6"'$  (1.676 m)   Wt 97.5 kg   LMP 06/26/2015 (Exact Date)   SpO2 100%   BMI 34.70 kg/m   Visual Acuity Right Eye Distance:   Left Eye Distance:   Bilateral Distance:    Right Eye Near:   Left Eye Near:    Bilateral Near:     Physical Exam GEN:     alert, non-toxic appearing female in no distress    HENT:  mucus membranes moist, oropharyngeal without lesions or exudate, no tonsillar hypertrophy,  moderate erythematous edematous turbinates, clear nasal discharge, bilateral TM normal EYES:   pupils equal and reactive, no scleral injection or discharge NECK:  normal ROM, no lymphadenopathy, no meningismus   RESP:  no increased work of breathing, clear to auscultation bilaterally CVS:   regular rate and rhythm Skin:    warm and dry    UC Treatments / Results  Labs (all labs ordered are listed, but only abnormal results are displayed) Labs Reviewed  GROUP A STREP BY PCR  RESP PANEL BY RT-PCR (RSV, FLU A&B, COVID)  [id number]    EKG   Radiology No results found.  Procedures Procedures (including critical care time)  Medications Ordered in UC Medications - No data to display  Initial Impression / Assessment and Plan / UC Course  I have reviewed the triage vital signs and the nursing notes.  Pertinent labs & imaging results that were available during my care of the patient were reviewed by me and considered in my medical decision making (see chart for details).       Pt is a 52 y.o. female who presents for 1 day of respiratory symptoms. Brittany Baldwin is afebrile here without recent antipyretics. Satting well on room air. Overall pt is non-toxic appearing, well hydrated, without respiratory distress. Pulmonary exam is unremarkable.  Per chart review, she has COPD but pt denies this.  She has no history of tobacco use.  Reviewed Dr Golden Pop PFTs that showed evidence of small airway disease.  This may where this diagnosis originated.   COVID and influenza testing obtained and was negative. Strep PCR negative. History most consistent with viral respiratory illness. Discussed symptomatic treatment. Typical duration of symptoms discussed.   Return and ED precautions given and voiced understanding. Discussed MDM, treatment plan and plan for follow-up with patient who agrees with plan.     Final Clinical Impressions(s) / UC Diagnoses   Final diagnoses:  Viral upper respiratory tract infection     Discharge Instructions      Your strep throat, COVID, influenza and RSV tests are negative. I suspect you have a upper respiratory infection caused by a virus.    You can take Tylenol and/or Ibuprofen as needed for fever reduction and pain relief.  For cough: honey 1/2 to 1 teaspoon (you can dilute the  honey in water or another fluid).  You can also use guaifenesin and dextromethorphan for cough. You can use a humidifier for chest congestion and cough.  If you don't have a humidifier, you can sit in the bathroom with the hot shower running.      For sore throat: try warm salt water gargles, Mucinex sore throat cough drops or cepacol lozenges, throat spray, warm tea or water with lemon/honey, popsicles or ice, or OTC cold relief medicine for throat discomfort. You can also purchase chloraseptic spray at the pharmacy or dollar store.   For congestion: take a daily anti-histamine like Zyrtec, Claritin, and a oral decongestant, such as pseudoephedrine.  You can also use Flonase 1-2 sprays in each nostril daily. Afrin is also a good option, if you do not have high blood pressure.    It is important to stay hydrated: drink plenty of fluids (water, gatorade/powerade/pedialyte, juices, or teas) to keep your throat moisturized and help further relieve irritation/discomfort.    Return or go to the Emergency Department if symptoms worsen or do not improve in the next few days      ED Prescriptions   None    PDMP not reviewed this encounter.   Lyndee Hensen, DO 07/02/22 2029

## 2022-07-02 NOTE — ED Triage Notes (Signed)
Pt c/o sore throat, nasal drainage, chest and throat pain, yellow phlegm and cough x1day  Pt took a home covid test and it was normal

## 2022-07-02 NOTE — Discharge Instructions (Addendum)
Your strep throat, COVID, influenza and RSV tests are negative. I suspect you have a upper respiratory infection caused by a virus.    You can take Tylenol and/or Ibuprofen as needed for fever reduction and pain relief.    For cough: honey 1/2 to 1 teaspoon (you can dilute the honey in water or another fluid).  You can also use guaifenesin and dextromethorphan for cough. You can use a humidifier for chest congestion and cough.  If you don't have a humidifier, you can sit in the bathroom with the hot shower running.      For sore throat: try warm salt water gargles, Mucinex sore throat cough drops or cepacol lozenges, throat spray, warm tea or water with lemon/honey, popsicles or ice, or OTC cold relief medicine for throat discomfort. You can also purchase chloraseptic spray at the pharmacy or dollar store.   For congestion: take a daily anti-histamine like Zyrtec, Claritin, and a oral decongestant, such as pseudoephedrine.  You can also use Flonase 1-2 sprays in each nostril daily. Afrin is also a good option, if you do not have high blood pressure.    It is important to stay hydrated: drink plenty of fluids (water, gatorade/powerade/pedialyte, juices, or teas) to keep your throat moisturized and help further relieve irritation/discomfort.    Return or go to the Emergency Department if symptoms worsen or do not improve in the next few days

## 2022-07-08 ENCOUNTER — Encounter: Payer: Self-pay | Admitting: Internal Medicine

## 2022-07-09 ENCOUNTER — Ambulatory Visit: Payer: Federal, State, Local not specified - PPO | Admitting: Internal Medicine

## 2022-07-09 ENCOUNTER — Encounter: Payer: Self-pay | Admitting: Internal Medicine

## 2022-07-09 VITALS — BP 126/72 | HR 78 | Temp 97.6°F | Ht 66.0 in | Wt 221.0 lb

## 2022-07-09 DIAGNOSIS — J069 Acute upper respiratory infection, unspecified: Secondary | ICD-10-CM

## 2022-07-09 MED ORDER — PROMETHAZINE-DM 6.25-15 MG/5ML PO SYRP: 5.0000 mL | ORAL_SOLUTION | Freq: Four times a day (QID) | ORAL | 0 refills | Status: AC | PRN

## 2022-07-09 MED ORDER — DOXYCYCLINE HYCLATE 100 MG PO TABS: 100.0000 mg | ORAL_TABLET | Freq: Two times a day (BID) | ORAL | 0 refills | Status: AC

## 2022-07-09 NOTE — Progress Notes (Signed)
Date:  07/09/2022   Name:  Brittany Baldwin   DOB:  [birth year]   MRN:  [id number]   Chief Complaint: Cough (X 7 days. Yellow production.Patient went to UC 6 days ago and she was NEG for strep, covid, and flu. When laying on rt side at night, she has light wheezing.)  Cough This is a new problem. The current episode started in the past 7 days. The problem has been gradually worsening. The problem occurs every few minutes. The cough is Productive of sputum. Associated symptoms include nasal congestion, shortness of breath and wheezing. Pertinent negatives include no chills, ear pain, fever, hemoptysis, sore throat or sweats. She has tried OTC cough suppressant for the symptoms. The treatment provided mild relief.    Lab Results  Component Value Date   NA 138 09/03/2020   K 4.6 09/03/2020   CO2 24 09/03/2020   GLUCOSE 103 (H) 09/03/2020   BUN 7 09/03/2020   CREATININE 0.96 09/03/2020   CALCIUM 10.2 10/08/2020   EGFR 72 09/03/2020   GFRNONAA 70 06/29/2019   Lab Results  Component Value Date   CHOL 149 09/03/2020   HDL 50 09/03/2020   LDLCALC 86 09/03/2020   TRIG 64 09/03/2020   CHOLHDL 3.0 09/03/2020   Lab Results  Component Value Date   TSH 3.170 09/03/2020   No results found for: "HGBA1C" Lab Results  Component Value Date   WBC 4.2 09/03/2020   HGB 14.0 09/03/2020   HCT 41.0 09/03/2020   MCV 87 09/03/2020   PLT 209 09/03/2020   Lab Results  Component Value Date   ALT 21 09/03/2020   AST 22 09/03/2020   ALKPHOS 53 09/03/2020   BILITOT 0.7 09/03/2020   Lab Results  Component Value Date   VD25OH 35.6 01/19/2017     Review of Systems  Constitutional:  Negative for chills and fever.  HENT:  Negative for ear pain and sore throat.   Respiratory:  Positive for cough, shortness of breath and wheezing. Negative for hemoptysis.   Gastrointestinal:  Negative for nausea and vomiting.  Psychiatric/Behavioral:  Positive for sleep disturbance. Negative for dysphoric mood. The  patient is not nervous/anxious.     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   Spondylosis of 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 06/14/2021   Osteophyte of left hip 04/19/2020   Acanthosis nigricans 07/30/2018   Esophageal dysphagia    Functional dyspepsia 02/20/2017   Dependent edema 08/05/2016   Plantar fasciitis, bilateral 08/05/2016   Constipation by delayed colonic transit 08/05/2016   Vocal cord nodules 02/13/2016   S/P total hysterectomy 07/02/2015   Tachycardia 05/10/2015   Gastroesophageal reflux disease 03/28/2014   Hyperlipidemia, mixed 03/28/2014    Allergies  Allergen Reactions   Codeine Shortness Of Breath   Aspirin Other (See Comments)    Stomach issues   Diphenhydramine Hcl Other (See Comments)    Through an I.V. Drip in addition to other meds. Caused hallucinations   Adhesive [Tape] Rash    ELECTRODES FROM HOLTER MONITOR CAUSED RASH   Hydrocodone Rash    Past Surgical History:  Procedure Laterality Date   ABDOMINAL HYSTERECTOMY Bilateral 07/02/2015   Procedure: HYSTERECTOMY ABDOMINAL WITH BS;  Surgeon: Brayton Mars, MD;  Location: [facility];  Service: Gynecology;  Laterality: Bilateral;   APPENDECTOMY     BIOPSY N/A 05/24/2020   Procedure: BIOPSY;  Surgeon: Lin Landsman, MD;  Location: [place];  Service: Endoscopy;  Laterality: N/A;   CARDIAC ELECTROPHYSIOLOGY STUDY AND ABLATION     CHOLECYSTECTOMY     COLONOSCOPY  2013   COLONOSCOPY WITH PROPOFOL N/A 03/10/2017   Procedure: COLONOSCOPY WITH PROPOFOL;  Surgeon: Lin Landsman, MD;  Location: [place];  Service: Endoscopy;  Laterality: N/A;   CYSTOSCOPY Bilateral 07/02/2015    Procedure: CYSTOSCOPY;  Surgeon: Brayton Mars, MD;  Location: [facility] ORS;  Service: Gynecology;  Laterality: Bilateral;   ESOPHAGEAL DILATION  03/10/2017   Procedure: ESOPHAGEAL DILATION;  Surgeon: Lin Landsman, MD;  Location: [place];  Service: Endoscopy;;   ESOPHAGEAL MANOMETRY N/A 07/24/2021   Procedure: ESOPHAGEAL MANOMETRY (EM);  Surgeon: Mauri Pole, MD;  Location: [facility];  Service: Endoscopy;  Laterality: N/A;   ESOPHAGOGASTRODUODENOSCOPY N/A 03/10/2017   Procedure: ESOPHAGOGASTRODUODENOSCOPY (EGD);  Surgeon: Lin Landsman, MD;  Location: [place];  Service: Endoscopy;  Laterality: N/A;   ESOPHAGOGASTRODUODENOSCOPY (EGD) WITH PROPOFOL N/A 07/01/2019   Procedure: ESOPHAGOGASTRODUODENOSCOPY (EGD) WITH PROPOFOL;  Surgeon: Lin Landsman, MD;  Location: [place];  Service: Endoscopy;  Laterality: N/A;   ESOPHAGOGASTRODUODENOSCOPY (EGD) WITH PROPOFOL N/A 05/24/2020   Procedure: ESOPHAGOGASTRODUODENOSCOPY (EGD) WITH PROPOFOL;  Surgeon: Lin Landsman, MD;  Location: [place];  Service: Endoscopy;  Laterality: N/A;   LAPAROTOMY N/A 07/11/2015   Procedure: hematoma evacuation;  Surgeon: Brayton Mars, MD;  Location: [facility] ORS;  Service: Gynecology;  Laterality: N/A;   NISSEN FUNDOPLICATION     TUBAL LIGATION      Social History   Tobacco Use   Smoking status: Never   Smokeless tobacco: Never  Vaping Use   Vaping Use: Never used  Substance Use Topics   Alcohol use: Not Currently   Drug use: Never     Medication list has been reviewed and updated.  Current Meds  Medication Sig   atorvastatin (LIPITOR) 10 MG tablet TAKE 1 TABLET(10 MG) BY MOUTH DAILY AT 6 PM   azelastine (ASTELIN) 0.1 % nasal spray Place 2 sprays into both nostrils 2 (two) times daily. Use in each nostril as directed   Cholecalciferol 50 MCG (2000 UT) CAPS Take 2,000 Units by mouth daily.   doxycycline (VIBRA-TABS) 100 MG tablet  Take 1 tablet (100 mg total) by mouth 2 (two) times daily for 10 days.   nadolol (CORGARD) 40 MG tablet Take by mouth.   promethazine-dextromethorphan (PROMETHAZINE-DM) 6.25-15 MG/5ML syrup Take 5 mLs by mouth 4 (four) times daily as needed for up to 9 days for cough.   propranolol (INDERAL) 20 MG tablet Take 1 tablet (20 mg total) by mouth as needed.   [DISCONTINUED] promethazine-dextromethorphan (PROMETHAZINE-DM) 6.25-15 MG/5ML syrup Take 5 mLs by mouth 4 (four) times daily as needed for cough.       07/09/2022    2:22 PM 09/24/2021    4:47 PM 07/24/2021   11:42 AM 07/11/2021    8:23 AM  GAD 7 : Generalized Anxiety Score  Nervous, Anxious, on Edge 0 0 0 0  Control/stop worrying 0 0 0 0  Worry too much - different things 0 0 0 0  Trouble relaxing 0 0 0 0  Restless 0 0 0 0  Easily annoyed or irritable 0 0 0 0  Afraid - awful might happen 0 0 0  0  Total GAD 7 Score 0 0 0 0  Anxiety Difficulty Not difficult at all  Not difficult at all Not difficult at all       07/09/2022    2:22 PM 09/24/2021    4:46 PM 07/24/2021   11:41 AM  Depression screen PHQ 2/9  Decreased Interest 0 0 0  Down, Depressed, Hopeless 0 0 0  PHQ - 2 Score 0 0 0  Altered sleeping 0 0 0  Tired, decreased energy 0 0 0  Change in appetite 0 0 0  Feeling bad or failure about yourself  0 0 0  Trouble concentrating 0 0 0  Moving slowly or fidgety/restless 0 0 0  Suicidal thoughts 0 0 0  PHQ-9 Score 0 0 0  Difficult doing work/chores Not difficult at all Not difficult at all Not difficult at all    BP Readings from Last 3 Encounters:  07/09/22 126/72  07/02/22 (!) 114/90  04/10/22 122/80    Physical Exam Vitals and nursing note reviewed.  Constitutional:      General: She is not in acute distress.    Appearance: Normal appearance. She is well-developed. She is ill-appearing.  HENT:     Head: Normocephalic and atraumatic.  Cardiovascular:     Rate and Rhythm: Normal rate and regular rhythm.  Pulmonary:      Effort: Pulmonary effort is normal. No respiratory distress.     Breath sounds: Transmitted upper airway sounds present. Examination of the right-upper field reveals decreased breath sounds. Examination of the left-upper field reveals decreased breath sounds. Decreased breath sounds present. No wheezing.  Musculoskeletal:     Cervical back: Normal range of motion.  Skin:    General: Skin is warm and dry.     Findings: No rash.  Neurological:     Mental Status: She is alert and oriented to person, place, and time.  Psychiatric:        Mood and Affect: Mood normal.        Behavior: Behavior normal.     Wt Readings from Last 3 Encounters:  07/09/22 221 lb (100.2 kg)  07/02/22 215 lb (97.5 kg)  04/10/22 216 lb (98 kg)    BP 126/72   Pulse 78   Temp 97.6 F (36.4 C) (Oral)   Ht '5\' 6"'$  (1.676 m)   Wt 221 lb (100.2 kg)   LMP 06/26/2015 (Exact Date)   SpO2 99%   BMI 35.67 kg/m   Assessment and Plan: Problem List Items Addressed This Visit   None Visit Diagnoses     URI with cough and congestion    -  Primary   initially viral now bacterial will treat with antibiotics and cough syrup pt declines albuterol MDI due to side effects will push fluids and continue mucinex   Relevant Medications   doxycycline (VIBRA-TABS) 100 MG tablet   promethazine-dextromethorphan (PROMETHAZINE-DM) 6.25-15 MG/5ML syrup        Partially dictated using Editor, commissioning. Any errors are unintentional.  Halina Maidens, MD [facility]  07/09/2022

## 2022-08-07 ENCOUNTER — Other Ambulatory Visit: Payer: Self-pay | Admitting: Internal Medicine

## 2022-08-07 DIAGNOSIS — E782 Mixed hyperlipidemia: Secondary | ICD-10-CM

## 2022-08-07 NOTE — Telephone Encounter (Signed)
Requested medication (s) are due for refill today: yes  Requested medication (s) are on the active medication list: yes  Last refill:  04/23/22 #30 2 refills  Future visit scheduled: no  Notes to clinic:  protocol failed. Last lab 09/03/20. Do you want to refill Rx?     Requested Prescriptions  Pending Prescriptions Disp Refills   atorvastatin (LIPITOR) 10 MG tablet [Pharmacy Med Name: ATORVASTATIN '10MG'$  TABLETS] 30 tablet 2    Sig: TAKE 1 TABLET(10 MG) BY MOUTH DAILY AT 6 PM     Cardiovascular:  Antilipid - Statins Failed - 08/07/2022 11:29 AM      Failed - Lipid Panel in normal range within the last 12 months    Cholesterol, Total  Date Value Ref Range Status  09/03/2020 149 100 - 199 mg/dL Final   LDL Chol Calc (NIH)  Date Value Ref Range Status  09/03/2020 86 0 - 99 mg/dL Final   HDL  Date Value Ref Range Status  09/03/2020 50 >39 mg/dL Final   Triglycerides  Date Value Ref Range Status  09/03/2020 64 0 - 149 mg/dL Final         Passed - Patient is not pregnant      Passed - Valid encounter within last 12 months    Recent Outpatient Visits           4 weeks ago URI with cough and congestion   [place] [facility] at [facility], Jesse Sans, MD   3 months ago Acute rhinitis   [place] [facility] at [place], Earley Abide, MD   8 months ago Spondylosis of 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   [place] [facility] at [place], Earley Abide, MD   10 months ago Spondylosis of 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   [facility] at [place], Earley Abide, MD   1 year ago Greater trochanteric pain syndrome of right lower extremity   [facility] at [facility], Earley Abide, MD

## 2022-09-27 ENCOUNTER — Other Ambulatory Visit: Payer: Self-pay | Admitting: Internal Medicine

## 2022-09-27 DIAGNOSIS — R000 Tachycardia, unspecified: Secondary | ICD-10-CM

## 2022-09-29 ENCOUNTER — Other Ambulatory Visit: Payer: Self-pay | Admitting: Internal Medicine

## 2022-09-29 DIAGNOSIS — R000 Tachycardia, unspecified: Secondary | ICD-10-CM

## 2022-09-30 NOTE — Telephone Encounter (Signed)
Sent mychart message to set up follow up appointment.

## 2022-11-12 ENCOUNTER — Other Ambulatory Visit: Payer: Self-pay | Admitting: Internal Medicine

## 2022-11-12 DIAGNOSIS — E782 Mixed hyperlipidemia: Secondary | ICD-10-CM

## 2022-11-18 DIAGNOSIS — Z13220 Encounter for screening for lipoid disorders: Secondary | ICD-10-CM | POA: Diagnosis not present

## 2022-11-18 DIAGNOSIS — Z130 Encounter for screening for diseases of the blood and blood-forming organs and certain disorders involving the immune mechanism: Secondary | ICD-10-CM | POA: Diagnosis not present

## 2022-11-18 DIAGNOSIS — Z131 Encounter for screening for diabetes mellitus: Secondary | ICD-10-CM | POA: Diagnosis not present

## 2022-11-18 DIAGNOSIS — Z1329 Encounter for screening for other suspected endocrine disorder: Secondary | ICD-10-CM | POA: Diagnosis not present

## 2022-12-24 ENCOUNTER — Encounter: Payer: PRIVATE HEALTH INSURANCE | Admitting: Internal Medicine

## 2022-12-28 ENCOUNTER — Other Ambulatory Visit: Payer: Self-pay | Admitting: Internal Medicine

## 2022-12-28 DIAGNOSIS — R000 Tachycardia, unspecified: Secondary | ICD-10-CM

## 2022-12-29 DIAGNOSIS — L810 Postinflammatory hyperpigmentation: Secondary | ICD-10-CM | POA: Diagnosis not present

## 2022-12-29 DIAGNOSIS — L738 Other specified follicular disorders: Secondary | ICD-10-CM | POA: Diagnosis not present

## 2022-12-29 DIAGNOSIS — L905 Scar conditions and fibrosis of skin: Secondary | ICD-10-CM | POA: Diagnosis not present

## 2022-12-30 ENCOUNTER — Encounter: Payer: Self-pay | Admitting: Internal Medicine

## 2022-12-30 ENCOUNTER — Ambulatory Visit (INDEPENDENT_AMBULATORY_CARE_PROVIDER_SITE_OTHER): Payer: Federal, State, Local not specified - PPO | Admitting: Internal Medicine

## 2022-12-30 VITALS — BP 124/78 | HR 83 | Ht 66.0 in | Wt 221.0 lb

## 2022-12-30 DIAGNOSIS — E782 Mixed hyperlipidemia: Secondary | ICD-10-CM | POA: Diagnosis not present

## 2022-12-30 DIAGNOSIS — R000 Tachycardia, unspecified: Secondary | ICD-10-CM

## 2022-12-30 DIAGNOSIS — R7303 Prediabetes: Secondary | ICD-10-CM

## 2022-12-30 DIAGNOSIS — J4520 Mild intermittent asthma, uncomplicated: Secondary | ICD-10-CM

## 2022-12-30 DIAGNOSIS — J3089 Other allergic rhinitis: Secondary | ICD-10-CM

## 2022-12-30 MED ORDER — ASMANEX (60 METERED DOSES) 220 MCG/ACT IN AEPB: 1.0000 | INHALATION_SPRAY | Freq: Every day | RESPIRATORY_TRACT | 5 refills | Status: DC

## 2022-12-30 NOTE — Patient Instructions (Signed)
Cut back on carbohydrates and sweets; try to lose 5-10 lbs before the next appointment  Schedule your mammogram.

## 2022-12-30 NOTE — Assessment & Plan Note (Signed)
Recommend Zyrtec daily as needed

## 2022-12-30 NOTE — Assessment & Plan Note (Addendum)
A1c at GYN in June = 6.4 Need to lose weight and cut back on carbohydrates Will recheck at [facility] in November

## 2022-12-30 NOTE — Progress Notes (Signed)
Date:  12/30/2022   Name:  Brittany Baldwin   DOB:  [birth year]   MRN:  [id number]   Chief Complaint: Hyperlipidemia, Menopause (Weight gain. ), and Wheezing (Notices she wheezing laying down and can sometimes here whizzing when breathing out. Was told in past pulmonology told her she has mild asthma. )  Hyperlipidemia This is a chronic problem. The problem is controlled. Pertinent negatives include no chest pain. Current antihyperlipidemic treatment includes statins. The current treatment provides significant improvement of lipids.  Asthma She complains of cough and wheezing. This is a recurrent problem. Episode onset: about three years ago. Pertinent negatives include no chest pain, fever, headaches or trouble swallowing. Her past medical history is significant for asthma.  Diabetes She presents for her follow-up diabetic visit. Diabetes type: prediabetes. Pertinent negatives for hypoglycemia include no dizziness, headaches or nervousness/anxiousness. Pertinent negatives for diabetes include no chest pain and no fatigue. When asked about current treatments, none were reported.    Lab Results  Component Value Date   NA 138 09/03/2020   K 4.6 09/03/2020   CO2 24 09/03/2020   GLUCOSE 103 (H) 09/03/2020   BUN 7 09/03/2020   CREATININE 0.96 09/03/2020   CALCIUM 10.2 10/08/2020   EGFR 72 09/03/2020   GFRNONAA 70 06/29/2019   Lab Results  Component Value Date   CHOL 149 09/03/2020   HDL 50 09/03/2020   LDLCALC 86 09/03/2020   TRIG 64 09/03/2020   CHOLHDL 3.0 09/03/2020   Lab Results  Component Value Date   TSH 3.170 09/03/2020   No results found for: "HGBA1C" Lab Results  Component Value Date   WBC 4.2 09/03/2020   HGB 14.0 09/03/2020   HCT 41.0 09/03/2020   MCV 87 09/03/2020   PLT 209 09/03/2020   Lab Results  Component Value Date   ALT 21 09/03/2020   AST 22 09/03/2020   ALKPHOS 53 09/03/2020   BILITOT 0.7 09/03/2020   Lab Results  Component Value Date   VD25OH  35.6 01/19/2017     Review of Systems  Constitutional:  Negative for chills, fatigue and fever.  HENT:  Negative for trouble swallowing.   Eyes:  Negative for visual disturbance.  Respiratory:  Positive for cough and wheezing. Negative for chest tightness.   Cardiovascular:  Positive for palpitations. Negative for chest pain and leg swelling.  Skin:  Negative for rash.  Allergic/Immunologic: Positive for environmental allergies.  Neurological:  Negative for dizziness, light-headedness and headaches.  Psychiatric/Behavioral:  Negative for dysphoric mood and sleep disturbance. The patient is not nervous/anxious.     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   Spondylosis of 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 06/14/2021   Osteophyte of left hip 04/19/2020   Acanthosis nigricans 07/30/2018   Esophageal dysphagia    Functional dyspepsia 02/20/2017   Dependent edema 08/05/2016   Plantar fasciitis, bilateral 08/05/2016   Constipation by delayed colonic transit 08/05/2016   Vocal cord nodules 02/13/2016   S/P total hysterectomy 07/02/2015   Tachycardia 05/10/2015   Gastroesophageal reflux disease 03/28/2014   Hyperlipidemia, mixed 03/28/2014    Allergies  Allergen Reactions   Codeine Shortness Of Breath  Aspirin Other (See Comments)    Stomach issues   Diphenhydramine Hcl Other (See Comments)    Through an I.V. Drip in addition to other meds. Caused hallucinations   Adhesive [Tape] Rash    ELECTRODES FROM HOLTER MONITOR CAUSED RASH   Hydrocodone Rash    Past Surgical History:  Procedure  Laterality Date   ABDOMINAL HYSTERECTOMY Bilateral 07/02/2015   Procedure: HYSTERECTOMY ABDOMINAL WITH BS;  Surgeon: Herold Harms, MD;  Location: [facility] ORS;  Service: Gynecology;  Laterality: Bilateral;   APPENDECTOMY     BIOPSY N/A 05/24/2020   Procedure: BIOPSY;  Surgeon: Toney Reil, MD;  Location: [facility];  Service: Endoscopy;  Laterality: N/A;   CARDIAC ELECTROPHYSIOLOGY STUDY AND ABLATION     CHOLECYSTECTOMY     COLONOSCOPY  2013   COLONOSCOPY WITH PROPOFOL N/A 03/10/2017   Procedure: COLONOSCOPY WITH PROPOFOL;  Surgeon: Toney Reil, MD;  Location: [facility];  Service: Endoscopy;  Laterality: N/A;   CYSTOSCOPY Bilateral 07/02/2015   Procedure: CYSTOSCOPY;  Surgeon: Herold Harms, MD;  Location: [facility];  Service: Gynecology;  Laterality: Bilateral;   ESOPHAGEAL DILATION  03/10/2017   Procedure: ESOPHAGEAL DILATION;  Surgeon: Toney Reil, MD;  Location: [facility];  Service: Endoscopy;;   ESOPHAGEAL MANOMETRY N/A 07/24/2021   Procedure: ESOPHAGEAL MANOMETRY (EM);  Surgeon: Napoleon Form, MD;  Location: [facility];  Service: Endoscopy;  Laterality: N/A;   ESOPHAGOGASTRODUODENOSCOPY N/A 03/10/2017   Procedure: ESOPHAGOGASTRODUODENOSCOPY (EGD);  Surgeon: Toney Reil, MD;  Location[facility];  Service: Endoscopy;  Laterality: N/A;   ESOPHAGOGASTRODUODENOSCOPY (EGD) WITH PROPOFOL N/A 07/01/2019   Procedure: ESOPHAGOGASTRODUODENOSCOPY (EGD) WITH PROPOFOL;  Surgeon: Toney Reil, MD;  Location: [facility];  Service: Endoscopy;  Laterality: N/A;   ESOPHAGOGASTRODUODENOSCOPY (EGD) WITH PROPOFOL N/A 05/24/2020   Procedure: ESOPHAGOGASTRODUODENOSCOPY (EGD) WITH PROPOFOL;  Surgeon: Toney Reil, MD;  Location: [facility];  Service: Endoscopy;  Laterality: N/A;   LAPAROTOMY N/A 07/11/2015   Procedure: hematoma evacuation;  Surgeon: Herold Harms, MD;  Location: [facility] ORS;   Service: Gynecology;  Laterality: N/A;   NISSEN FUNDOPLICATION     TUBAL LIGATION      Social History   Tobacco Use   Smoking status: Never   Smokeless tobacco: Never  Vaping Use   Vaping status: Never Used  Substance Use Topics   Alcohol use: Not Currently   Drug use: Never     Medication list has been reviewed and updated.  Current Meds  Medication Sig   atorvastatin (LIPITOR) 10 MG tablet TAKE 1 TABLET(10 MG) BY MOUTH DAILY AT 6 PM   azelastine (ASTELIN) 0.1 % nasal spray Place 2 sprays into both nostrils 2 (two) times daily. Use in each nostril as directed   Cholecalciferol 50 MCG (2000 UT) CAPS Take 2,000 Units by mouth daily.   mometasone (ASMANEX, 60 METERED DOSES,) 220 MCG/ACT inhaler Inhale 1 puff into the lungs daily.   propranolol (INDERAL) 20 MG tablet TAKE 1 TABLET(20 MG) BY MOUTH TWICE DAILY AS NEEDED       12/30/2022    4:26 PM 07/09/2022    2:22 PM 09/24/2021    4:47 PM 07/24/2021   11:42 AM  GAD 7 : Generalized Anxiety Score  Nervous, Anxious, on Edge 0 0 0 0  Control/stop worrying 0 0 0 0  Worry too much - different things 0 0 0 0  Trouble relaxing 0 0 0 0  Restless 0 0 0  0  Easily annoyed or irritable 0 0 0 0  Afraid - awful might happen 0 0 0 0  Total GAD 7 Score 0 0 0 0  Anxiety Difficulty Not difficult at all Not difficult at all  Not difficult at all       12/30/2022    4:25 PM 07/09/2022    2:22 PM 09/24/2021    4:46 PM  Depression screen PHQ 2/9  Decreased Interest 0 0 0  Down, Depressed, Hopeless 0 0 0  PHQ - 2 Score 0 0 0  Altered sleeping 3 0 0  Tired, decreased energy 3 0 0  Change in appetite 0 0 0  Feeling bad or failure about yourself  0 0 0  Trouble concentrating 0 0 0  Moving slowly or fidgety/restless 0 0 0  Suicidal thoughts 0 0 0  PHQ-9 Score 6 0 0  Difficult doing work/chores Not difficult at all Not difficult at all Not difficult at all    BP Readings from Last 3 Encounters:  12/30/22 124/78  07/09/22 126/72  07/02/22  (!) 114/90    Physical Exam Vitals and nursing note reviewed.  Constitutional:      General: She is not in acute distress.    Appearance: Normal appearance. She is well-developed.  HENT:     Head: Normocephalic and atraumatic.  Neck:     Vascular: No carotid bruit.  Cardiovascular:     Rate and Rhythm: Normal rate and regular rhythm.     Pulses: Normal pulses.     Heart sounds: No murmur heard. Pulmonary:     Effort: Pulmonary effort is normal. No respiratory distress.     Breath sounds: No wheezing or rhonchi.  Musculoskeletal:     Cervical back: Normal range of motion.     Right lower leg: No edema.     Left lower leg: No edema.  Lymphadenopathy:     Cervical: No cervical adenopathy.  Skin:    General: Skin is warm and dry.     Findings: No rash.  Neurological:     General: No focal deficit present.     Mental Status: She is alert and oriented to person, place, and time.  Psychiatric:        Mood and Affect: Mood normal.        Behavior: Behavior normal.     Wt Readings from Last 3 Encounters:  12/30/22 221 lb (100.2 kg)  07/09/22 221 lb (100.2 kg)  07/02/22 215 lb (97.5 kg)    BP 124/78   Pulse 83   Ht 5\' 6"  (1.676 m)   Wt 221 lb (100.2 kg)   LMP 06/26/2015 (Exact Date)   SpO2 94%   BMI 35.67 kg/m   Assessment and Plan:  Problem List Items Addressed This Visit       Unprioritized   Tachycardia (Chronic)    Controlled on beta blocker Intolerant of albuterol due to increased heart rate      Prediabetes    A1c at GYN in June = 6.4 Need to lose weight and cut back on carbohydrates Will recheck at [facility] in November      Mild intermittent asthma without complication    Recent increase in wheezing and PND Will initiate steroid inhaler with Asmanex daily      Relevant Medications   mometasone (ASMANEX, 60 METERED DOSES,) 220 MCG/ACT inhaler   Hyperlipidemia, mixed - Primary (Chronic)    LDL is  Lab Results  Component Value Date  LDLCALC 86  09/03/2020  Currently being treated with atorvastatin with good compliance and no concerns. She had lipid panel with GYN which is not available. Continue same medications and recheck after diet changes in November.       Environmental and seasonal allergies    Recommend Zyrtec daily as needed       Return in about 4 months (around 05/02/2023) for CPX.    Reubin Milan, MD [facility] [place]

## 2022-12-30 NOTE — Assessment & Plan Note (Signed)
Recent increase in wheezing and PND Will initiate steroid inhaler with Asmanex daily

## 2022-12-30 NOTE — Assessment & Plan Note (Signed)
Controlled on beta blocker Intolerant of albuterol due to increased heart rate

## 2022-12-30 NOTE — Assessment & Plan Note (Addendum)
LDL is  Lab Results  Component Value Date   LDLCALC 86 09/03/2020  Currently being treated with atorvastatin with good compliance and no concerns. She had lipid panel with GYN which is not available. Continue same medications and recheck after diet changes in November.

## 2023-01-13 ENCOUNTER — Institutional Professional Consult (permissible substitution): Payer: Federal, State, Local not specified - PPO | Admitting: Pulmonary Disease

## 2023-01-13 ENCOUNTER — Encounter: Payer: Self-pay | Admitting: Internal Medicine

## 2023-01-16 NOTE — Telephone Encounter (Signed)
Please review.  KP

## 2023-02-07 ENCOUNTER — Other Ambulatory Visit: Payer: Self-pay | Admitting: Internal Medicine

## 2023-02-07 DIAGNOSIS — E782 Mixed hyperlipidemia: Secondary | ICD-10-CM

## 2023-02-23 DIAGNOSIS — Z1231 Encounter for screening mammogram for malignant neoplasm of breast: Secondary | ICD-10-CM | POA: Diagnosis not present

## 2023-02-23 DIAGNOSIS — R92333 Mammographic heterogeneous density, bilateral breasts: Secondary | ICD-10-CM | POA: Diagnosis not present

## 2023-03-02 ENCOUNTER — Institutional Professional Consult (permissible substitution): Payer: Federal, State, Local not specified - PPO | Admitting: Pulmonary Disease

## 2023-03-11 DIAGNOSIS — R002 Palpitations: Secondary | ICD-10-CM | POA: Diagnosis not present

## 2023-03-11 DIAGNOSIS — I4711 Inappropriate sinus tachycardia, so stated: Secondary | ICD-10-CM | POA: Diagnosis not present

## 2023-03-11 DIAGNOSIS — R079 Chest pain, unspecified: Secondary | ICD-10-CM | POA: Diagnosis not present

## 2023-03-12 DIAGNOSIS — R079 Chest pain, unspecified: Secondary | ICD-10-CM | POA: Diagnosis not present

## 2023-03-27 ENCOUNTER — Other Ambulatory Visit: Payer: Self-pay | Admitting: Internal Medicine

## 2023-03-27 DIAGNOSIS — R000 Tachycardia, unspecified: Secondary | ICD-10-CM

## 2023-03-27 NOTE — Telephone Encounter (Signed)
Requested Prescriptions  Pending Prescriptions Disp Refills   propranolol (INDERAL) 20 MG tablet [Pharmacy Med Name: PROPRANOLOL 20MG  TABLETS] 180 tablet 0    Sig: TAKE 1 TABLET(20 MG) BY MOUTH TWICE DAILY AS NEEDED     Cardiovascular:  Beta Blockers Passed - 03/27/2023  9:49 AM      Passed - Last BP in normal range    BP Readings from Last 1 Encounters:  12/30/22 124/78         Passed - Last Heart Rate in normal range    Pulse Readings from Last 1 Encounters:  12/30/22 83         Passed - Valid encounter within last 6 months    Recent Outpatient Visits           2 months ago Hyperlipidemia, mixed   [place] [facility] at [facility], Nyoka Cowden, MD   8 months ago URI with cough and congestion   [place] [facility] at [facility], Nyoka Cowden, MD   11 months ago Acute rhinitis   [place] [facility] at [facility], Ocie Bob, MD   1 year ago Spondylosis of 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   [place] [facility] at [facility], Ocie Bob, MD   1 year ago Spondylosis of 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   [facility] at [facility], Ocie Bob, MD       Future Appointments             In 1 month Judithann Graves, Nyoka Cowden, MD [facility] at [facility], [facility]

## 2023-04-28 ENCOUNTER — Encounter: Payer: PRIVATE HEALTH INSURANCE | Admitting: Internal Medicine

## 2023-05-04 ENCOUNTER — Other Ambulatory Visit: Payer: Self-pay | Admitting: Internal Medicine

## 2023-05-04 DIAGNOSIS — E782 Mixed hyperlipidemia: Secondary | ICD-10-CM

## 2023-05-06 ENCOUNTER — Other Ambulatory Visit: Payer: Self-pay

## 2023-05-06 ENCOUNTER — Other Ambulatory Visit: Payer: Self-pay | Admitting: Internal Medicine

## 2023-05-06 DIAGNOSIS — E782 Mixed hyperlipidemia: Secondary | ICD-10-CM

## 2023-05-06 NOTE — Telephone Encounter (Signed)
Requested medication (s) are due for refill today: Yes  Requested medication (s) are on the active medication list: Yes  Last refill:  04/07/23  Future visit scheduled: Yes  Notes to clinic:  Unable to refill per protocol due to failed labs, no updated results.      Requested Prescriptions  Pending Prescriptions Disp Refills   atorvastatin (LIPITOR) 10 MG tablet [Pharmacy Med Name: ATORVASTATIN 10MG  TABLETS] 30 tablet 2    Sig: TAKE 1 TABLET(10 MG) BY MOUTH DAILY AT 6 PM     Cardiovascular:  Antilipid - Statins Failed - 05/04/2023  3:32 AM      Failed - Lipid Panel in normal range within the last 12 months    Cholesterol, Total  Date Value Ref Range Status  09/03/2020 149 100 - 199 mg/dL Final   LDL Chol Calc (NIH)  Date Value Ref Range Status  09/03/2020 86 0 - 99 mg/dL Final   HDL  Date Value Ref Range Status  09/03/2020 50 >39 mg/dL Final   Triglycerides  Date Value Ref Range Status  09/03/2020 64 0 - 149 mg/dL Final         Passed - Patient is not pregnant      Passed - Valid encounter within last 12 months    Recent Outpatient Visits           4 months ago Hyperlipidemia, mixed   [place] [facility] at [facility], Nyoka Cowden, MD   10 months ago URI with cough and congestion   [place] [facility] at [facility], Nyoka Cowden, MD   1 year ago Acute rhinitis    [facility] Medicine at [facility], Ocie Bob, MD   1 year ago Spondylosis of 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    [facility] Medicine at [facility], Ocie Bob, MD   1 year ago Spondylosis of 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   [facility] at [facility], Ocie Bob, MD       Future Appointments             In 3 months Judithann Graves, Nyoka Cowden, MD [facility]  at [facility], [facility]

## 2023-05-07 ENCOUNTER — Encounter: Payer: Self-pay | Admitting: Internal Medicine

## 2023-05-08 NOTE — Telephone Encounter (Signed)
Requested medications are due for refill today.  no  Requested medications are on the active medications list.  yes  Last refill. 05/06/2023 #30 0 rf  Future visit scheduled.   yes  Notes to clinic.  Labs are expired. PT is requesting a 90 day supply.    Requested Prescriptions  Pending Prescriptions Disp Refills   atorvastatin (LIPITOR) 10 MG tablet [Pharmacy Med Name: ATORVASTATIN 10MG  TABLETS] 90 tablet     Sig: TAKE 1 TABLET(10 MG) BY MOUTH DAILY AT 6 PM     Cardiovascular:  Antilipid - Statins Failed - 05/06/2023  3:25 PM      Failed - Lipid Panel in normal range within the last 12 months    Cholesterol, Total  Date Value Ref Range Status  09/03/2020 149 100 - 199 mg/dL Final   LDL Chol Calc (NIH)  Date Value Ref Range Status  09/03/2020 86 0 - 99 mg/dL Final   HDL  Date Value Ref Range Status  09/03/2020 50 >39 mg/dL Final   Triglycerides  Date Value Ref Range Status  09/03/2020 64 0 - 149 mg/dL Final         Passed - Patient is not pregnant      Passed - Valid encounter within last 12 months    Recent Outpatient Visits           4 months ago Hyperlipidemia, mixed   [place] [facility] at [facility], Nyoka Cowden, MD   10 months ago URI with cough and congestion   [place] [facility] at [facility], Nyoka Cowden, MD   1 year ago Acute rhinitis   [place] [facility] at [facility], Ocie Bob, MD   1 year ago Spondylosis of 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   [place] [facility] at [facility], Ocie Bob, MD   1 year ago Spondylosis of 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   [facility] at [facility], Ocie Bob, MD       Future Appointments             In 3 months Judithann Graves, Nyoka Cowden, MD [facility] at [facility], [facility]

## 2023-05-13 ENCOUNTER — Encounter: Payer: PRIVATE HEALTH INSURANCE | Admitting: Internal Medicine

## 2023-06-17 ENCOUNTER — Telehealth: Payer: Self-pay | Admitting: Gastroenterology

## 2023-06-17 NOTE — Telephone Encounter (Signed)
 The patient called in to schedule appointment with Dr. Allegra Lai.

## 2023-06-22 ENCOUNTER — Other Ambulatory Visit: Payer: Self-pay

## 2023-06-23 ENCOUNTER — Other Ambulatory Visit: Payer: Self-pay

## 2023-06-23 ENCOUNTER — Ambulatory Visit: Payer: Federal, State, Local not specified - PPO | Admitting: Gastroenterology

## 2023-06-23 ENCOUNTER — Encounter: Payer: Self-pay | Admitting: Gastroenterology

## 2023-06-23 VITALS — BP 119/77 | HR 102 | Temp 97.7°F | Ht 66.0 in | Wt 220.0 lb

## 2023-06-23 DIAGNOSIS — R1319 Other dysphagia: Secondary | ICD-10-CM

## 2023-06-23 DIAGNOSIS — K219 Gastro-esophageal reflux disease without esophagitis: Secondary | ICD-10-CM

## 2023-06-23 NOTE — Progress Notes (Unsigned)
Brittany Repress, MD [address]  [address]  [place], [place] [address]  Main: [phone]  Fax: [phone]    Gastroenterology Consultation  Referring Provider:     Reubin Milan, MD Primary Care Physician:  Reubin Milan, MD Primary Gastroenterologist:  None Reason for Consultation: Flareup of GERD symptoms        HPI:   Brittany Baldwin is a 53 y.o. African-American female referred by Dr. Judithann Graves, Nyoka Cowden, MD  for consultation & management of recent flareup of GERD symptoms.  Patient states that her GERD symptoms have been fairly under control until recently, about a week ago when she had some fast food and soda, she suddenly choked and felt she aspirated resulting in severe coughing and gagging, significant retching.  Since then, she has been experiencing severe burning in her throat and is worried about the status of wrap from fundoplication in the past. She would like to undergo upper endoscopy as she is extremely worried if the partial wrap is completely lose   Patient underwent esophageal manometry as well as Bravo pH study in early 2023 by Dr. Lavon Paganini which were both unremarkable  NSAIDs: None   Antiplts/Anticoagulants/Anti thrombotics: None  She denies family history of GI malignancy She works at a front desk He does not smoke or drink alcohol She is on propranolol for benign tachycardia   GI Procedures:  Upper endoscopy 05/24/2020 - Normal duodenal bulb and second portion of the duodenum. - Erythematous mucosa in the gastric body and antrum. Biopsied. - A partial fundoplication was found. The wrap appears loose. - Esophagogastric landmarks identified. - Normal gastroesophageal junction and esophagus. Biopsied. DIAGNOSIS:  A. STOMACH; COLD BIOPSY:  - GASTRIC OXYNTIC MUCOSA WITH MILD REACTIVE FOVEOLAR HYPERPLASIA.  - NEGATIVE FOR H. PYLORI, DYSPLASIA, AND MALIGNANCY.   B. ESOPHAGUS; COLD BIOPSY:  - BENIGN SQUAMOUS MUCOSA WITH NO SIGNIFICANT  HISTOPATHOLOGIC CHANGE.  - NO INCREASE IN INTRAEPITHELIAL EOSINOPHILS (LESS THAN 2 PER HPF).  - NEGATIVE FOR DYSPLASIA AND MALIGNANCY.   EGD 07/01/2019 - Normal duodenal bulb and second portion of the duodenum. - A Nissen fundoplication was found. The wrap appears intact. - Normal stomach. Biopsied. - Normal esophagus. Biopsied.   DIAGNOSIS:  A.  STOMACH; COLD BIOPSY:  - OXYNTIC MUCOSA WITHOUT PATHOLOGIC CHANGES.  - NEGATIVE FOR H. PYLORI, INTESTINAL METAPLASIA, DYSPLASIA, AND  MALIGNANCY.   B.  ESOPHAGUS, DISTAL; COLD BIOPSY:  - STRATIFIED SQUAMOUS EPITHELIUM WITHOUT EOSINOPHILS, NEUTROPHILS, OR  REACTIVE CHANGES.  - EXTREMELY SCANT TINY STRIPS OF COLUMNAR EPITHELIUM.  - NEGATIVE FOR GOBLET CELLS, DYSPLASIA, AND MALIGNANCY.   C.  ESOPHAGUS, PROXIMAL; COLD BIOPSY:  - STRATIFIED SQUAMOUS EPITHELIUM WITHOUT EOSINOPHILS, NEUTROPHILS, OR  REACTIVE CHANGES.  - NEGATIVE FOR DYSPLASIA AND MALIGNANCY.  EGD and colonoscopy 03/10/2017 - Normal duodenal bulb and second portion of the duodenum. - A Nissen fundoplication was found. The wrap appears intact. Dilated to 18 mm. - Normal gastroesophageal junction and esophagus. - No specimens collected.  - Non-bleeding internal hemorrhoids. - The entire examined colon is normal. - No specimens collected.  EGD on 06/05/2014 A. GASTRIC ANTRUM (ENDOSCOPIC BIOPSY):  GASTRIC ANTRAL MUCOSA WITH REACTIVE FOVEOLAR HYPERPLASIA AND MILD CHRONIC GASTRITIS. NO ACTIVE GASTRITIS IS SEEN. IMMUNOHISTOCHEMISTRY FOR HELICOBACTER PYLORI IS NEGATIVE. B. ESOPHAGUS AT GASTROESOPHAGEAL JUNCTION (ENDOSCOPIC BIOPSY):  SQUAMOUS MUCOSA WITH NO PATHOLOGIC ABNORMALITY. NO EVIDENCE OF REFLUX OR EOSINOPHILIC ESOPHAGITIS IS SEEN. NEGATIVE FOR INTESTINAL METAPLASIA, DYSPLASIA OR MALIGNANCY. ----------------------------------------------------------------------------------------------------------- pH Bravo Testing Results 06/08/2014 at [facility]  Results of  48 hour pH  monitoring by way of an endoscopically  placed pH Bravo probe revealed: - NO Significant presence of acid as evidenced by pH lower than 4  throughout the monitoring period (DeMeester score of 1.4 & 2.1 on  days 1 & 2 respectively) - Given the above and recent workup demonstrating intact Nissen  Fundoplication, No additional reflux testing/interventions  Recommended. ------------------------------------------------------------------------------------------------ NM gastric emptying study 12/22/2014  Findings : The stomach is located normally in the left upper abdomen.  Solid gastric emptying at   60 minutes is 48% (normal range at   60 minutes is >10%). Solid gastric emptying at 120 minutes is 69% (normal range at 120 minutes is >40%). Solid gastric emptying at 244 minutes is 97% (normal range at 240 minutes is >90%).   Past Medical History:  Diagnosis Date   Anemia    H/O    COPD (chronic obstructive pulmonary disease) (HCC)    Dysrhythmia    SVT-HAD CARDIAC ABLATION DONE IN 2016 BUT PT STATES SHE STILL GETS INTERMITTENT TACHYCARDIC AND IS SYPMPTOMATIC WITH SOB, DIZZINESS DURING TACHY EPISODES (06-25-15)   Elevated parathyroid hormone 07/03/2016   Fibroid    GERD (gastroesophageal reflux disease)    Headache    H/O   Heart murmur    as child   History of fundoplication 12/14/2014   History of pulmonary embolus during pregnancy 05/16/2015   2001   Hoarseness, persistent 10/06/2017   Hypercalcemia 01/18/2017   PTH, PTHrP, 1,25 vit D and 25 vit D, Thyroid and Serum free light chains - normal  Repeat Calcium WNL   Hyperlipemia    Motion sickness    cars, boats   PE (pulmonary thromboembolism) (HCC) 2000   while on bed rest during pregnancy   PONV (postoperative nausea and vomiting)    Shortness of breath dyspnea    ASSOCIATED WITH TACHYCARDIA   Status post abdominal hysterectomy 07/02/2015   Status post TAH, bilateral salpingectomy. PATHOLOGY: Uterine fibroids and  adenomyosis    Tachycardia     Past Surgical History:  Procedure Laterality Date   ABDOMINAL HYSTERECTOMY Bilateral 07/02/2015   Procedure: HYSTERECTOMY ABDOMINAL WITH BS;  Surgeon: Herold Harms, MD;  Location: [facility] ORS;  Service: Gynecology;  Laterality: Bilateral;   APPENDECTOMY     BIOPSY N/A 05/24/2020   Procedure: BIOPSY;  Surgeon: Toney Reil, MD;  Location: [facility];  Service: Endoscopy;  Laterality: N/A;   CARDIAC ELECTROPHYSIOLOGY STUDY AND ABLATION     CHOLECYSTECTOMY     COLONOSCOPY  2013   COLONOSCOPY WITH PROPOFOL N/A 03/10/2017   Procedure: COLONOSCOPY WITH PROPOFOL;  Surgeon: Toney Reil, MD;  Location: [facility];  Service: Endoscopy;  Laterality: N/A;   CYSTOSCOPY Bilateral 07/02/2015   Procedure: CYSTOSCOPY;  Surgeon: Herold Harms, MD;  Location: [facility] ORS;  Service: Gynecology;  Laterality: Bilateral;   ESOPHAGEAL DILATION  03/10/2017   Procedure: ESOPHAGEAL DILATION;  Surgeon: Toney Reil, MD;  Location: [facility];  Service: Endoscopy;;   ESOPHAGEAL MANOMETRY N/A 07/24/2021   Procedure: ESOPHAGEAL MANOMETRY (EM);  Surgeon: Napoleon Form, MD;  Location: [facility];  Service: Endoscopy;  Laterality: N/A;   ESOPHAGOGASTRODUODENOSCOPY N/A 03/10/2017   Procedure: ESOPHAGOGASTRODUODENOSCOPY (EGD);  Surgeon: Toney Reil, MD;  Location: [facility];  Service: Endoscopy;  Laterality: N/A;   ESOPHAGOGASTRODUODENOSCOPY (EGD) WITH PROPOFOL N/A 07/01/2019   Procedure: ESOPHAGOGASTRODUODENOSCOPY (EGD) WITH PROPOFOL;  Surgeon: Toney Reil, MD;  Location: [facility];  Service: Endoscopy;  Laterality:  N/A;   ESOPHAGOGASTRODUODENOSCOPY (EGD) WITH PROPOFOL N/A 05/24/2020   Procedure: ESOPHAGOGASTRODUODENOSCOPY (EGD) WITH PROPOFOL;  Surgeon: Toney Reil, MD;  Location: [facility];  Service: Endoscopy;  Laterality: N/A;   LAPAROTOMY N/A 07/11/2015   Procedure: hematoma  evacuation;  Surgeon: Herold Harms, MD;  Location: [facility] OR[facility];  Service: Gynecology;  Laterality: N/A;   NISSEN FUNDOPLICATION     TUBAL LIGATION     Current Outpatient Medications:    atorvastatin (LIPITOR) 10 MG tablet, TAKE 1 TABLET(10 MG) BY MOUTH DAILY AT 6 PM, Disp: 30 tablet, Rfl: 0   Cholecalciferol 50 MCG (2000 UT) CAPS, Take 2,000 Units by mouth daily., Disp: , Rfl:    nadolol (CORGARD) 40 MG tablet, Take 40 mg by mouth 2 (two) times daily., Disp: , Rfl:    propranolol (INDERAL) 20 MG tablet, TAKE 1 TABLET(20 MG) BY MOUTH TWICE DAILY AS NEEDED, Disp: 180 tablet, Rfl: 0    Family History  Problem Relation Age of Onset   Diabetes Mother    Prostate cancer Father    Congestive Heart Failure Father    Heart failure Father    Cancer Neg Hx      Social History   Tobacco Use   Smoking status: Never   Smokeless tobacco: Never  Vaping Use   Vaping status: Never Used  Substance Use Topics   Alcohol use: Not Currently   Drug use: Never    Allergies as of 06/23/2023 - Review Complete 06/23/2023  Allergen Reaction Noted   Codeine Shortness Of Breath 05/09/2015   Aspirin Other (See Comments) 05/09/2015   Diphenhydramine hcl Other (See Comments) 05/09/2015   Adhesive [tape] Rash 06/25/2015   Hydrocodone Rash 07/11/2021    Review of Systems:    All systems reviewed and negative except where noted in HPI.   Physical Exam:  BP 119/77 (BP Location: Left Arm, Patient Position: Sitting, Cuff Size: Large)   Pulse (!) 102   Temp 97.7 F (36.5 C) (Oral)   Ht 5\' 6"  (1.676 m)   Wt 220 lb (99.8 kg)   LMP 06/26/2015 (Exact Date)   BMI 35.51 kg/m  Patient's last menstrual period was 06/26/2015 (exact date).  General:   Alert,  Well-developed, well-nourished, pleasant and cooperative in NAD Head:  Normocephalic and atraumatic. Eyes:  Sclera clear, no icterus.   Conjunctiva pink. Ears:  Normal auditory acuity. Nose:  No deformity, discharge, or lesions. Mouth:  No  deformity or lesions,oropharynx pink & moist. Neck:  Supple; no masses or thyromegaly. Abdomen:  Normal bowel sounds.  No bruits.  Soft, nontender and non-distended without masses, hepatosplenomegaly or hernias noted.  No guarding or rebound tenderness.   Rectal: Nor performed Msk:  Symmetrical without gross deformities. Good, equal movement & strength bilaterally. Pulses:  Normal pulses noted. Extremities:  No clubbing or edema.  No cyanosis. Neurologic:  Alert and oriented x3;  grossly normal neurologically. Skin:  Intact without significant lesions or rashes. No jaundice. Psych:  Alert and cooperative. Normal mood and affect.  Imaging Studies: Reviewed  Assessment and Plan:   Chaise Tewalt is a 53 y.o. African American female with history of refractory GERD status post Nissen's fundoplication for follow-up of chronic GERD, atypical chest pain recent episode of choking resulting in aspiration and intense gagging and retching with flareup of GERD symptoms.  Reports that her constipation is fairly under control   Flareup of GERD Recommend EGD for further evaluation Advised to take over-the-counter omeprazole 20 mg 1-2 times daily  before meals   Colon cancer screening: Repeat in 03/2027    Follow up based on the above workup, contact via MyChart as needed  Brittany Repress, MD

## 2023-06-24 ENCOUNTER — Encounter: Payer: Self-pay | Admitting: Gastroenterology

## 2023-06-30 ENCOUNTER — Ambulatory Visit
Admission: RE | Admit: 2023-06-30 | Discharge: 2023-06-30 | Disposition: A | Discharge status: Home / Self Care | Payer: Federal, State, Local not specified - PPO | Attending: Gastroenterology | Admitting: Gastroenterology

## 2023-06-30 ENCOUNTER — Ambulatory Visit: Payer: Federal, State, Local not specified - PPO | Admitting: Anesthesiology

## 2023-06-30 ENCOUNTER — Encounter: Payer: Self-pay | Admitting: Gastroenterology

## 2023-06-30 ENCOUNTER — Other Ambulatory Visit: Payer: Self-pay

## 2023-06-30 ENCOUNTER — Encounter
Admission: RE | Disposition: A | Discharge status: Home / Self Care | Payer: Self-pay | Source: Home / Self Care | Attending: Gastroenterology

## 2023-06-30 DIAGNOSIS — K219 Gastro-esophageal reflux disease without esophagitis: Secondary | ICD-10-CM | POA: Insufficient documentation

## 2023-06-30 DIAGNOSIS — R1013 Epigastric pain: Secondary | ICD-10-CM | POA: Diagnosis not present

## 2023-06-30 DIAGNOSIS — R1314 Dysphagia, pharyngoesophageal phase: Secondary | ICD-10-CM | POA: Insufficient documentation

## 2023-06-30 DIAGNOSIS — Z9889 Other specified postprocedural states: Secondary | ICD-10-CM | POA: Diagnosis not present

## 2023-06-30 DIAGNOSIS — E785 Hyperlipidemia, unspecified: Secondary | ICD-10-CM | POA: Diagnosis not present

## 2023-06-30 DIAGNOSIS — J4489 Other specified chronic obstructive pulmonary disease: Secondary | ICD-10-CM | POA: Diagnosis not present

## 2023-06-30 DIAGNOSIS — R1319 Other dysphagia: Secondary | ICD-10-CM | POA: Diagnosis not present

## 2023-06-30 HISTORY — DX: Supraventricular tachycardia, unspecified: I47.10

## 2023-06-30 HISTORY — PX: ESOPHAGOGASTRODUODENOSCOPY (EGD) WITH PROPOFOL: SHX5813

## 2023-06-30 SURGERY — ESOPHAGOGASTRODUODENOSCOPY (EGD) WITH PROPOFOL
Anesthesia: General | Site: Esophagus

## 2023-06-30 MED ORDER — PROPOFOL 10 MG/ML IV BOLUS
INTRAVENOUS | Status: DC | PRN
  Administered 2023-06-30: 100 mg via INTRAVENOUS
  Administered 2023-06-30: 30 mg via INTRAVENOUS
  Administered 2023-06-30: 50 mg via INTRAVENOUS

## 2023-06-30 MED ORDER — ONDANSETRON HCL 4 MG/2ML IJ SOLN
4.0000 mg | Freq: Once | INTRAMUSCULAR | Status: AC
  Administered 2023-06-30: 4 mg via INTRAVENOUS

## 2023-06-30 MED ORDER — DEXMEDETOMIDINE HCL IN NACL 80 MCG/20ML IV SOLN
INTRAVENOUS | Status: DC | PRN
  Administered 2023-06-30: 4 ug via INTRAVENOUS

## 2023-06-30 MED ORDER — GLYCOPYRROLATE 0.2 MG/ML IJ SOLN
INTRAMUSCULAR | Status: AC
  Filled 2023-06-30: qty 1

## 2023-06-30 MED ORDER — GLYCOPYRROLATE 0.2 MG/ML IJ SOLN
INTRAMUSCULAR | Status: DC | PRN
  Administered 2023-06-30: .2 mg via INTRAVENOUS

## 2023-06-30 MED ORDER — SODIUM CHLORIDE 0.9 % IV SOLN: INTRAVENOUS | Status: DC

## 2023-06-30 MED ORDER — ONDANSETRON HCL 4 MG/2ML IJ SOLN
INTRAMUSCULAR | Status: AC
  Filled 2023-06-30: qty 2

## 2023-06-30 MED ORDER — LIDOCAINE HCL (CARDIAC) PF 100 MG/5ML IV SOSY
PREFILLED_SYRINGE | INTRAVENOUS | Status: DC | PRN
  Administered 2023-06-30: 50 mg via INTRAVENOUS

## 2023-06-30 SURGICAL SUPPLY — 28 items
BALLN DILATOR 12-15 8 (BALLOONS)
BALLN DILATOR 15-18 8 (BALLOONS)
BALLN DILATOR CRE 0-12 8 (BALLOONS)
BALLN DILATOR ESOPH 8 10 CRE (MISCELLANEOUS) IMPLANT
BALLOON DILATOR 12-15 8 (BALLOONS) IMPLANT
BALLOON DILATOR 15-18 8 (BALLOONS) IMPLANT
BALLOON DILATOR CRE 0-12 8 (BALLOONS) IMPLANT
BLOCK BITE 60FR ADLT L/F GRN (MISCELLANEOUS) ×1 IMPLANT
CLIP HMST 235XBRD CATH ROT (MISCELLANEOUS) IMPLANT
ELECT REM PT RETURN 9FT ADLT (ELECTROSURGICAL)
ELECTRODE REM PT RTRN 9FT ADLT (ELECTROSURGICAL) IMPLANT
FCP ESCP3.2XJMB 240X2.8X (MISCELLANEOUS)
FORCEPS BIOP RAD 4 LRG CAP 4 (CUTTING FORCEPS) IMPLANT
FORCEPS ESCP3.2XJMB 240X2.8X (MISCELLANEOUS) IMPLANT
GOWN CVR UNV OPN BCK APRN NK (MISCELLANEOUS) ×2 IMPLANT
INJECTOR VARIJECT VIN23 (MISCELLANEOUS) IMPLANT
KIT DEFENDO VALVE AND CONN (KITS) IMPLANT
KIT PRC NS LF DISP ENDO (KITS) ×1 IMPLANT
MANIFOLD NEPTUNE II (INSTRUMENTS) ×1 IMPLANT
MARKER SPOT ENDO TATTOO 5ML (MISCELLANEOUS) IMPLANT
RETRIEVER NET PLAT FOOD (MISCELLANEOUS) IMPLANT
SNARE SHORT THROW 13M SML OVAL (MISCELLANEOUS) IMPLANT
SNARE SHORT THROW 30M LRG OVAL (MISCELLANEOUS) IMPLANT
SYR INFLATION 60ML (SYRINGE) IMPLANT
TRAP ETRAP POLY (MISCELLANEOUS) IMPLANT
VARIJECT INJECTOR VIN23 (MISCELLANEOUS)
WATER STERILE IRR 250ML POUR (IV SOLUTION) ×1 IMPLANT
WIRE CRE 18-20MM 8CM F G (MISCELLANEOUS) IMPLANT

## 2023-06-30 NOTE — Anesthesia Postprocedure Evaluation (Signed)
Anesthesia Post Note  Patient: Brittany Baldwin  Procedure(s) Performed: ESOPHAGOGASTRODUODENOSCOPY (EGD) WITH PROPOFOL WITH BIOPSY (Esophagus)  Patient location during evaluation: PACU Anesthesia Type: General Level of consciousness: awake and alert Pain management: pain level controlled Vital Signs Assessment: post-procedure vital signs reviewed and stable Respiratory status: spontaneous breathing, nonlabored ventilation, respiratory function stable and patient connected to nasal cannula oxygen Cardiovascular status: blood pressure returned to baseline and stable Postop Assessment: no apparent nausea or vomiting Anesthetic complications: no   No notable events documented.   Last Vitals:  Vitals:   06/30/23 1102 06/30/23 1107  BP: 101/71 102/70  Pulse: 80 73  Resp: 14 14  Temp: (!) 36.2 C (!) 36.2 C  SpO2: 97% 100%    Last Pain:  Vitals:   06/30/23 1107  TempSrc:   PainSc: 0-No pain                 Alveta Quintela C Kateena Degroote

## 2023-06-30 NOTE — Op Note (Signed)
[facility] Gastroenterology Patient Name: Brittany Baldwin Procedure Date: 06/30/2023 10:38 AM MRN: [id number] Account #: [id number] Date of Birth: 08-22-1970 Admit Type: Outpatient Age: 53 Room: [facility] OR ROOM 01 Gender: Female Note Status: Finalized Instrument Name: [id number] Procedure:             Upper GI endoscopy Indications:           Epigastric abdominal pain, Esophageal reflux symptoms                         that recur despite appropriate therapy Providers:             Toney Reil MD, MD Referring MD:          Bari Edward, MD (Referring MD) Medicines:             General Anesthesia Complications:         No immediate complications. Estimated blood loss: None. Procedure:             Pre-Anesthesia Assessment:                        - Prior to the procedure, a History and Physical was                         performed, and patient medications and allergies were                         reviewed. The patient is competent. The risks and                         benefits of the procedure and the sedation options and                         risks were discussed with the patient. All questions                         were answered and informed consent was obtained.                         Patient identification and proposed procedure were                         verified by the physician, the nurse, the                         anesthesiologist, the anesthetist and the technician                         in the pre-procedure area in the procedure room in the                         endoscopy suite. Mental Status Examination: alert and                         oriented. Airway Examination: normal oropharyngeal                         airway and neck mobility. Respiratory Examination:  clear to auscultation. CV Examination: normal.                         Prophylactic Antibiotics: The patient does not require                          prophylactic antibiotics. Prior Anticoagulants: The                         patient has taken no anticoagulant or antiplatelet                         agents. ASA Grade Assessment: III - A patient with                         severe systemic disease. After reviewing the risks and                         benefits, the patient was deemed in satisfactory                         condition to undergo the procedure. The anesthesia                         plan was to use general anesthesia. Immediately prior                         to administration of medications, the patient was                         re-assessed for adequacy to receive sedatives. The                         heart rate, respiratory rate, oxygen saturations,                         blood pressure, adequacy of pulmonary ventilation, and                         response to care were monitored throughout the                         procedure. The physical status of the patient was                         re-assessed after the procedure.                        After obtaining informed consent, the endoscope was                         passed under direct vision. Throughout the procedure,                         the patient's blood pressure, pulse, and oxygen                         saturations were monitored continuously. The Endoscope  was introduced through the mouth, and advanced to the                         second part of duodenum. The upper GI endoscopy was                         accomplished without difficulty. The patient tolerated                         the procedure well. Findings:      The duodenal bulb and second portion of the duodenum were normal.      Evidence of a Nissen fundoplication was found in the gastric fundus. The       wrap appeared intact. This was traversed.      The entire examined stomach was normal. Biopsies were taken with a cold       forceps for Helicobacter pylori  testing.      The gastroesophageal junction and examined esophagus were normal. Impression:            - Normal duodenal bulb and second portion of the                         duodenum.                        - A Nissen fundoplication was found. The wrap appears                         intact.                        - Normal stomach. Biopsied.                        - Normal gastroesophageal junction and esophagus. Recommendation:        - Discharge patient to home (with escort).                        - Resume previous diet today.                        - Continue present medications.                        - Await pathology results. Procedure Code(s):     --- Professional ---                        [phone], Esophagogastroduodenoscopy, flexible,                         transoral; with biopsy, single or multiple Diagnosis Code(s):     --- Professional ---                        [phone], Other specified postprocedural states                        R10.13, Epigastric pain                        K21.9, Gastro-esophageal reflux disease without  esophagitis CPT copyright 2022 American Medical Association. All rights reserved. The codes documented in this report are preliminary and upon coder review may  be revised to meet current compliance requirements. Dr. Libby Maw Toney Reil MD, MD 06/30/2023 11:01:16 AM This report has been signed electronically. Number of Addenda: 0 Note Initiated On: 06/30/2023 10:38 AM Total Procedure Duration: 0 hours 6 minutes 10 seconds  Estimated Blood Loss:  Estimated blood loss: none.      [facility]

## 2023-06-30 NOTE — H&P (Signed)
Arlyss Repress, MD [address]  [address]  [place], [place] [address]  Main: [phone]  Fax: [phone] Pager: [phone]  Primary Care Physician:  Reubin Milan, MD Primary Gastroenterologist:  Dr. Arlyss Repress  Pre-Procedure History & Physical: HPI:  Brittany Baldwin is a 53 y.o. female is here for an endoscopy.   Past Medical History:  Diagnosis Date   Anemia    H/O    COPD (chronic obstructive pulmonary disease) (HCC)    Dysrhythmia    SVT-HAD CARDIAC ABLATION DONE IN 2016 BUT PT STATES SHE STILL GETS INTERMITTENT TACHYCARDIC AND IS SYPMPTOMATIC WITH SOB, DIZZINESS DURING TACHY EPISODES (06-25-15)   Elevated parathyroid hormone 07/03/2016   Fibroid    GERD (gastroesophageal reflux disease)    Headache    H/O   Heart murmur    as child   History of fundoplication 12/14/2014   History of pulmonary embolus during pregnancy 05/16/2015   2001   Hoarseness, persistent 10/06/2017   Hypercalcemia 01/18/2017   PTH, PTHrP, 1,25 vit D and 25 vit D, Thyroid and Serum free light chains - normal  Repeat Calcium WNL   Hyperlipemia    Motion sickness    cars, boats   PE (pulmonary thromboembolism) (HCC) 2000   while on bed rest during pregnancy   PONV (postoperative nausea and vomiting)    Shortness of breath dyspnea    ASSOCIATED WITH TACHYCARDIA   Status post abdominal hysterectomy 07/02/2015   Status post TAH, bilateral salpingectomy. PATHOLOGY: Uterine fibroids and adenomyosis    SVT (supraventricular tachycardia) (HCC)    Tachycardia     Past Surgical History:  Procedure Laterality Date   ABDOMINAL HYSTERECTOMY Bilateral 07/02/2015   Procedure: HYSTERECTOMY ABDOMINAL WITH BS;  Surgeon: Herold Harms, MD;  Location: [facility];  Service: Gynecology;  Laterality: Bilateral;   APPENDECTOMY     BIOPSY N/A 05/24/2020   Procedure: BIOPSY;  Surgeon: Toney Reil, MD;  Location: [facility];  Service: Endoscopy;  Laterality: N/A;   CARDIAC  ELECTROPHYSIOLOGY STUDY AND ABLATION     CHOLECYSTECTOMY     COLONOSCOPY  2013   COLONOSCOPY WITH PROPOFOL N/A 03/10/2017   Procedure: COLONOSCOPY WITH PROPOFOL;  Surgeon: Toney Reil, MD;  Location: [facility];  Service: Endoscopy;  Laterality: N/A;   CYSTOSCOPY Bilateral 07/02/2015   Procedure: CYSTOSCOPY;  Surgeon: Herold Harms, MD;  Location: [facility];  Service: Gynecology;  Laterality: Bilateral;   ESOPHAGEAL DILATION  03/10/2017   Procedure: ESOPHAGEAL DILATION;  Surgeon: Toney Reil, MD;  Location: [facility];  Service: Endoscopy;;   ESOPHAGEAL MANOMETRY N/A 07/24/2021   Procedure: ESOPHAGEAL MANOMETRY (EM);  Surgeon: Napoleon Form, MD;  Location: [facility];  Service: Endoscopy;  Laterality: N/A;   ESOPHAGOGASTRODUODENOSCOPY N/A 03/10/2017   Procedure: ESOPHAGOGASTRODUODENOSCOPY (EGD);  Surgeon: Toney Reil, MD;  Location: [facility];  Service: Endoscopy;  Laterality: N/A;   ESOPHAGOGASTRODUODENOSCOPY (EGD) WITH PROPOFOL N/A 07/01/2019   Procedure: ESOPHAGOGASTRODUODENOSCOPY (EGD) WITH PROPOFOL;  Surgeon: Toney Reil, MD;  Location: [facility];  Service: Endoscopy;  Laterality: N/A;   ESOPHAGOGASTRODUODENOSCOPY (EGD) WITH PROPOFOL N/A 05/24/2020   Procedure: ESOPHAGOGASTRODUODENOSCOPY (EGD) WITH PROPOFOL;  Surgeon: Toney Reil, MD;  Location: [facility];  Service: Endoscopy;  Laterality: N/A;   LAPAROTOMY N/A 07/11/2015   Procedure: hematoma evacuation;  Surgeon: Herold Harms, MD;  Location: [facility];  Service: Gynecology;  Laterality: N/A;   NISSEN FUNDOPLICATION     TUBAL LIGATION  Prior to Admission medications   Medication Sig Start Date End Date Taking? Authorizing Provider  atorvastatin (LIPITOR) 10 MG tablet TAKE 1 TABLET(10 MG) BY MOUTH DAILY AT 6 PM 05/06/23  Yes Reubin Milan, MD  Cholecalciferol 50 MCG (2000 UT) CAPS Take 2,000 Units by mouth daily.   Yes [provider]  nadolol (CORGARD) 40 MG tablet Take 40 mg by mouth 2 (two) times daily.   Yes [provider]  propranolol (INDERAL) 20 MG tablet TAKE 1 TABLET(20 MG) BY MOUTH TWICE DAILY AS NEEDED 03/27/23  Yes Reubin Milan, MD    Allergies as of 06/23/2023 - Review Complete 06/23/2023  Allergen Reaction Noted   Codeine Shortness Of Breath 05/09/2015   Aspirin Other (See Comments) 05/09/2015   Diphenhydramine hcl Other (See Comments) 05/09/2015   Adhesive [tape] Rash 06/25/2015   Hydrocodone Rash 07/11/2021    Family History  Problem Relation Age of Onset   Diabetes Mother    Prostate cancer Father    Congestive Heart Failure Father    Heart failure Father    Cancer Neg Hx     Social History   Socioeconomic History   Marital status: Married    Spouse name: Mysha Peeler   Number of children: Not on file   Years of education: Not on file   Highest education level: Not on file  Occupational History   Not on file  Tobacco Use   Smoking status: Never   Smokeless tobacco: Never  Vaping Use   Vaping status: Never Used  Substance and Sexual Activity   Alcohol use: Not Currently   Drug use: Never   Sexual activity: Yes    Partners: Male    Birth control/protection: Surgical  Other Topics Concern   Not on file  Social History Narrative   Not on file   Social Drivers of Health   Financial Resource Strain: Not on file  Food Insecurity: Not on file  Transportation Needs: Not on file  Physical Activity: Not on file  Stress: Not on file  Social Connections: Not on file  Intimate Partner Violence: Not on file    Review of Systems: See HPI, otherwise negative ROS  Physical Exam: BP 125/80   Temp 97.9 F (36.6 C) (Temporal)   Resp 15   Ht 5' 5.98" (1.676 m)   Wt 96.3 kg   LMP 06/26/2015 (Exact Date)   SpO2 99%   BMI 34.30 kg/m  General:   Alert,  pleasant and cooperative in NAD Head:  Normocephalic and atraumatic. Neck:  Supple; no masses or  thyromegaly. Lungs:  Clear throughout to auscultation.    Heart:  Regular rate and rhythm. Abdomen:  Soft, nontender and nondistended. Normal bowel sounds, without guarding, and without rebound.   Neurologic:  Alert and  oriented x4;  grossly normal neurologically.  Impression/Plan: Brittany Baldwin is here for an endoscopy to be performed for flare up of GERD  Risks, benefits, limitations, and alternatives regarding  endoscopy have been reviewed with the patient.  Questions have been answered.  All parties agreeable.   Lannette Donath, MD  06/30/2023, 9:50 AM

## 2023-06-30 NOTE — Anesthesia Preprocedure Evaluation (Addendum)
Anesthesia Evaluation  Patient identified by MRN, date of birth, ID band Patient awake    Reviewed: Allergy & Precautions, H&P , NPO status , Patient's Chart, lab work & pertinent test results  History of Anesthesia Complications (+) PONV and history of anesthetic complications  Airway Mallampati: II  TM Distance: >3 FB Neck ROM: Full    Dental no notable dental hx. (+) Caps Caps/veneers entire upper front teeth:   Pulmonary neg pulmonary ROS, shortness of breath, asthma , COPD   Pulmonary exam normal breath sounds clear to auscultation       Cardiovascular negative cardio ROS Normal cardiovascular exam+ dysrhythmias + Valvular Problems/Murmurs  Rhythm:Regular Rate:Normal  Office notes 03-11-23 Tachycardia/palpitations The patient has a history of tachycardia with diagnoses of IST and SVT. She underwent prior EP study and slow pathway ablation in 2015. She has since experienced continued, intermittent tachycardia and palpitations that have been attributed to IST or sinus node sensitivity. Subsequent monitors in 2017 and 2019 have not shown significant arrhythmias or conduction disease; HR average has been within the normal range.  Given her history and prior studies, suspect her symptoms to be due to sinus rhythm, sinus tachycardia, IST rather than another primary arrhythmia.  We increased her nadolol last year and her symptoms have improved. No significant complaints this year. She is exercising now with walking and TaiBo. Recommended she start weight training.   -continue nadolol to 40 mg in the morning and 80 mg in the evening (metoprolol did not work) -Continue short acting propranolol as needed which she has not used - She has a English as a second language teacher, which she can use to document rhythm during episodes.  Chest pain/primary prevention  We obtained a stress echocardiogram which was unremarkable and negative for ischemia.  She continued  to have symptoms so we obtained a cardiac CTA which did not show any calcifications.  Provided reassurance that she does not have evidence of coronary disease based on these 2 tests.  Her mother had an MI in the fall 2020.  Continue atorvastatin 20 mg Most recent lipid panel noted total cholesterol 149, triglycerides 64, HDL 50, and LDL 86     Neuro/Psych  Headaches negative neurological ROS  negative psych ROS   GI/Hepatic negative GI ROS, Neg liver ROS,GERD  ,,  Endo/Other  negative endocrine ROS    Renal/GU negative Renal ROS  negative genitourinary   Musculoskeletal negative musculoskeletal ROS (+) Arthritis ,    Abdominal   Peds negative pediatric ROS (+)  Hematology negative hematology ROS (+) Blood dyscrasia, anemia   Anesthesia Other Findings   Tachycardia Hyperlipemia GERD (gastroesophageal reflux disease) Fibroid Headache Anemia Shortness of breath dyspnea Dysrhythmia Heart murmur PONV (postoperative nausea and vomiting) PE (pulmonary thromboembolism) (HCC) Motion sickness Status post abdominal hysterectomy History of fundoplication History of pulmonary embolus during pregnancy Hoarseness, persistent Hypercalcemia Elevated parathyroid hormone COPD (chronic obstructive pulmonary disease) (HCC)  Hx SVT    Reproductive/Obstetrics negative OB ROS                             Anesthesia Physical Anesthesia Plan  ASA: 3  Anesthesia Plan: General   Post-op Pain Management:    Induction: Intravenous  PONV Risk Score and Plan:   Airway Management Planned: Natural Airway and Nasal Cannula  Additional Equipment:   Intra-op Plan:   Post-operative Plan:   Informed Consent: I have reviewed the patients History and Physical, chart, labs and discussed the  procedure including the risks, benefits and alternatives for the proposed anesthesia with the patient or authorized representative who has indicated his/her understanding and  acceptance.     Dental Advisory Given  Plan Discussed with: Anesthesiologist, CRNA and Surgeon  Anesthesia Plan Comments: (Patient consented for risks of anesthesia including but not limited to:  - adverse reactions to medications - risk of airway placement if required - damage to eyes, teeth, lips or other oral mucosa - nerve damage due to positioning  - sore throat or hoarseness - Damage to heart, brain, nerves, lungs, other parts of body or loss of life  Patient voiced understanding and assent.)        Anesthesia Quick Evaluation

## 2023-06-30 NOTE — Transfer of Care (Signed)
Immediate Anesthesia Transfer of Care Note  Patient: Brittany Baldwin  Procedure(s) Performed: ESOPHAGOGASTRODUODENOSCOPY (EGD) WITH PROPOFOL WITH BIOPSY (Esophagus)  Patient Location: PACU  Anesthesia Type: General  Level of Consciousness: awake, alert  and patient cooperative  Airway and Oxygen Therapy: Patient Spontanous Breathing and Patient connected to supplemental oxygen  Post-op Assessment: Post-op Vital signs reviewed, Patient's Cardiovascular Status Stable, Respiratory Function Stable, Patent Airway and No signs of Nausea or vomiting  Post-op Vital Signs: Reviewed and stable  Complications: No notable events documented.

## 2023-07-01 ENCOUNTER — Encounter: Payer: Self-pay | Admitting: Gastroenterology

## 2023-07-01 LAB — SURGICAL PATHOLOGY

## 2023-07-02 ENCOUNTER — Encounter: Payer: Self-pay | Admitting: Gastroenterology

## 2023-07-08 ENCOUNTER — Other Ambulatory Visit: Payer: Self-pay | Admitting: Internal Medicine

## 2023-07-08 DIAGNOSIS — R000 Tachycardia, unspecified: Secondary | ICD-10-CM

## 2023-07-16 DIAGNOSIS — L2089 Other atopic dermatitis: Secondary | ICD-10-CM | POA: Diagnosis not present

## 2023-07-16 DIAGNOSIS — L658 Other specified nonscarring hair loss: Secondary | ICD-10-CM | POA: Diagnosis not present

## 2023-07-16 DIAGNOSIS — L659 Nonscarring hair loss, unspecified: Secondary | ICD-10-CM | POA: Diagnosis not present

## 2023-07-25 ENCOUNTER — Other Ambulatory Visit: Payer: Self-pay | Admitting: Internal Medicine

## 2023-07-25 DIAGNOSIS — E782 Mixed hyperlipidemia: Secondary | ICD-10-CM

## 2023-07-28 ENCOUNTER — Encounter: Payer: Self-pay | Admitting: Gastroenterology

## 2023-07-28 ENCOUNTER — Encounter: Payer: Self-pay | Admitting: Internal Medicine

## 2023-07-28 ENCOUNTER — Other Ambulatory Visit: Payer: Self-pay | Admitting: Internal Medicine

## 2023-07-28 DIAGNOSIS — E782 Mixed hyperlipidemia: Secondary | ICD-10-CM

## 2023-07-28 MED ORDER — ATORVASTATIN CALCIUM 10 MG PO TABS: 10.0000 mg | ORAL_TABLET | Freq: Every day | ORAL | 1 refills | Status: DC

## 2023-07-28 NOTE — Telephone Encounter (Signed)
 Requested medications are due for refill today.  yes  Requested medications are on the active medications list.  yes  Last refill. 05/06/2023 #30 0 rf  Future visit scheduled.   yes  Notes to clinic.  Labs are expired.    Requested Prescriptions  Pending Prescriptions Disp Refills   atorvastatin (LIPITOR) 10 MG tablet [Pharmacy Med Name: ATORVASTATIN 10MG  TABLETS] 30 tablet 0    Sig: TAKE 1 TABLET(10 MG) BY MOUTH DAILY AT 6 PM     Cardiovascular:  Antilipid - Statins Failed - 07/28/2023  8:18 AM      Failed - Lipid Panel in normal range within the last 12 months    Cholesterol, Total  Date Value Ref Range Status  09/03/2020 149 100 - 199 mg/dL Final   LDL Chol Calc (NIH)  Date Value Ref Range Status  09/03/2020 86 0 - 99 mg/dL Final   HDL  Date Value Ref Range Status  09/03/2020 50 >39 mg/dL Final   Triglycerides  Date Value Ref Range Status  09/03/2020 64 0 - 149 mg/dL Final         Passed - Patient is not pregnant      Passed - Valid encounter within last 12 months    Recent Outpatient Visits           7 months ago Hyperlipidemia, mixed   [facility] at [facility], Nyoka Cowden, MD   1 year ago URI with cough and congestion   [facility] at [facility], Nyoka Cowden, MD   1 year ago Acute rhinitis   [facility] at [facility], Ocie Bob, MD   1 year ago Spondylosis of 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   [place] at [facility], Ocie Bob, MD   1 year ago Spondylosis of 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   [facility] at [facility], Ocie Bob, MD       Future Appointments             In 3 months Judithann Graves, Nyoka Cowden, MD [facility] at [facility], [facility]

## 2023-07-28 NOTE — Telephone Encounter (Signed)
 Please review.  KP

## 2023-08-12 ENCOUNTER — Encounter: Payer: Self-pay | Admitting: Internal Medicine

## 2023-10-05 DIAGNOSIS — R002 Palpitations: Secondary | ICD-10-CM | POA: Diagnosis not present

## 2023-10-05 DIAGNOSIS — R079 Chest pain, unspecified: Secondary | ICD-10-CM | POA: Diagnosis not present

## 2023-10-05 DIAGNOSIS — I4711 Inappropriate sinus tachycardia, so stated: Secondary | ICD-10-CM | POA: Diagnosis not present

## 2023-10-05 LAB — BASIC METABOLIC PANEL WITH GFR
BUN: 9 (ref 4–21)
CO2: 25 — AB (ref 13–22)
Chloride: 7 — AB (ref 99–108)
Creatinine: 0.9 (ref 0.5–1.1)
Glucose: 82
Potassium: 4.8 meq/L (ref 3.5–5.1)
Sodium: 140 (ref 137–147)

## 2023-10-05 LAB — CBC AND DIFFERENTIAL
HCT: 40 (ref 36–46)
Hemoglobin: 13.5 (ref 12.0–16.0)
Platelets: 218 10*3/uL (ref 150–400)
WBC: 6.1

## 2023-10-05 LAB — HEPATIC FUNCTION PANEL
ALT: 17 U/L (ref 7–35)
AST: 23 (ref 13–35)
Alkaline Phosphatase: 65 (ref 25–125)

## 2023-10-05 LAB — HEMOGLOBIN A1C: Hemoglobin A1C: 6.4

## 2023-10-05 LAB — COMPREHENSIVE METABOLIC PANEL WITH GFR
Calcium: 10.9 — AB (ref 8.7–10.7)
eGFR: 82

## 2023-10-05 LAB — TSH: TSH: 3 (ref 0.41–5.90)

## 2023-10-06 ENCOUNTER — Ambulatory Visit: Admitting: Internal Medicine

## 2023-10-06 ENCOUNTER — Encounter: Payer: Self-pay | Admitting: Internal Medicine

## 2023-10-06 DIAGNOSIS — R7303 Prediabetes: Secondary | ICD-10-CM

## 2023-10-06 DIAGNOSIS — R590 Localized enlarged lymph nodes: Secondary | ICD-10-CM | POA: Diagnosis not present

## 2023-10-06 DIAGNOSIS — E559 Vitamin D deficiency, unspecified: Secondary | ICD-10-CM | POA: Diagnosis not present

## 2023-10-06 NOTE — Progress Notes (Signed)
 Date:  10/06/2023   Name:  Brittany Baldwin   DOB:  [birth year]   MRN:  [id number]   Chief Complaint: Results (Elevated calcium  per cardiologist ) and Mass (Swollen lymph node, Left side of neck)  Diabetes She presents for her follow-up diabetic visit. Diabetes type: prediabetes. Her disease course has been improving. Pertinent negatives for hypoglycemia include no dizziness, headaches or nervousness/anxiousness. Pertinent negatives for diabetes include no chest pain, no fatigue, no foot paresthesias, no visual change, no weakness and no weight loss. Symptoms are stable.  Hypercalcemia - noted at least 5 years ago.  In 2022 PTH and Calcium  were normal.  She feels well, only complains of perimenopausal symptoms of fatigue and some brain fog.  No muscle spasms or chest pain.  She is having tachycardia and is wearing a monitor from cardiology.  Labs done yesterday show calcium  10.9.   Review of Systems  Constitutional:  Negative for fatigue, unexpected weight change and weight loss.  HENT:  Negative for trouble swallowing.   Eyes:  Negative for visual disturbance.  Respiratory:  Negative for cough, chest tightness, shortness of breath and wheezing.   Cardiovascular:  Negative for chest pain, palpitations and leg swelling.  Gastrointestinal:  Negative for abdominal pain, constipation and diarrhea.  Musculoskeletal:  Negative for arthralgias and myalgias.  Neurological:  Negative for dizziness, weakness, light-headedness and headaches.  Psychiatric/Behavioral:  Negative for dysphoric mood and sleep disturbance. The patient is not nervous/anxious.      Lab Results  Component Value Date   NA 140 10/05/2023   K 4.8 10/05/2023   CO2 25 (A) 10/05/2023   GLUCOSE 103 (H) 09/03/2020   BUN 9 10/05/2023   CREATININE 0.9 10/05/2023   CALCIUM  10.9 (A) 10/05/2023   EGFR 82 10/05/2023   GFRNONAA 70 06/29/2019   Lab Results  Component Value Date   CHOL 149 09/03/2020   HDL 50 09/03/2020   LDLCALC 86  09/03/2020   TRIG 64 09/03/2020   CHOLHDL 3.0 09/03/2020   Lab Results  Component Value Date   TSH 3.00 10/05/2023   Lab Results  Component Value Date   HGBA1C 6.4 10/05/2023   Lab Results  Component Value Date   WBC 6.1 10/05/2023   HGB 13.5 10/05/2023   HCT 40 10/05/2023   MCV 87 09/03/2020   PLT 218 10/05/2023   Lab Results  Component Value Date   ALT 17 10/05/2023   AST 23 10/05/2023   ALKPHOS 65 10/05/2023   BILITOT 0.7 09/03/2020   Lab Results  Component Value Date   VD25OH 35.6 01/19/2017     Patient Active Problem List   Diagnosis Date Noted   Vitamin D  deficiency 10/06/2023   Prediabetes 12/30/2022   Mild intermittent asthma without complication 12/30/2022   Environmental and seasonal allergies 04/10/2022   Greater trochanteric pain syndrome of right lower extremity 07/25/2021   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 Drip in addition to other meds. Caused hallucinations   Adhesive [Tape] Rash    ELECTRODES FROM HOLTER MONITOR CAUSED RASH   Hydrocodone Rash    Past Surgical History:  Procedure Laterality Date   ABDOMINAL HYSTERECTOMY Bilateral 07/02/2015   Procedure: HYSTERECTOMY ABDOMINAL WITH BS;  Surgeon: Colan Dash, MD;  Location: [facility];  Service: Gynecology;  Laterality: Bilateral;   APPENDECTOMY     BIOPSY N/A 05/24/2020   Procedure:  BIOPSY;  Surgeon: Selena Daily, MD;  Location: [facility];  Service: Endoscopy;  Laterality: N/A;   CARDIAC ELECTROPHYSIOLOGY STUDY AND ABLATION     CHOLECYSTECTOMY     COLONOSCOPY  2013   COLONOSCOPY WITH PROPOFOL  N/A 03/10/2017   Procedure: COLONOSCOPY WITH PROPOFOL ;  Surgeon: Selena Daily, MD;  Location: [facility];  Service: Endoscopy;  Laterality: N/A;   CYSTOSCOPY Bilateral 07/02/2015   Procedure: CYSTOSCOPY;  Surgeon: Colan Dash, MD;  Location: [facility];  Service: Gynecology;  Laterality: Bilateral;   ESOPHAGEAL DILATION  03/10/2017   Procedure: ESOPHAGEAL DILATION;  Surgeon: Selena Daily, MD;  Location: [facility];  Service: Endoscopy;;   ESOPHAGEAL MANOMETRY N/A 07/24/2021   Procedure: ESOPHAGEAL MANOMETRY (EM);  Surgeon: Sergio Dandy, MD;  Location: [facility];  Service: Endoscopy;  Laterality: N/A;   ESOPHAGOGASTRODUODENOSCOPY N/A 03/10/2017   Procedure: ESOPHAGOGASTRODUODENOSCOPY (EGD);  Surgeon: Selena Daily, MD;  Location: [facility];  Service: Endoscopy;  Laterality: N/A;   ESOPHAGOGASTRODUODENOSCOPY (EGD) WITH PROPOFOL  N/A 07/01/2019   Procedure: ESOPHAGOGASTRODUODENOSCOPY (EGD) WITH PROPOFOL ;  Surgeon: Selena Daily, MD;  Location: [facility];  Service: Endoscopy;  Laterality: N/A;   ESOPHAGOGASTRODUODENOSCOPY (EGD) WITH PROPOFOL  N/A 05/24/2020   Procedure: ESOPHAGOGASTRODUODENOSCOPY (EGD) WITH PROPOFOL ;  Surgeon: Selena Daily, MD;  Location: [facility];  Service: Endoscopy;  Laterality: N/A;   ESOPHAGOGASTRODUODENOSCOPY (EGD) WITH PROPOFOL  N/A 06/30/2023   Procedure: ESOPHAGOGASTRODUODENOSCOPY (EGD) WITH PROPOFOL  WITH BIOPSY;  Surgeon: Selena Daily, MD;  Location: [facility];  Service: Endoscopy;  Laterality: N/A;   LAPAROTOMY N/A 07/11/2015   Procedure: hematoma evacuation;  Surgeon: Colan Dash, MD;  Location: [facility];  Service: Gynecology;   Laterality: N/A;   NISSEN FUNDOPLICATION     TUBAL LIGATION      Social History   Tobacco Use   Smoking status: Never   Smokeless tobacco: Never  Vaping Use   Vaping status: Never Used  Substance Use Topics   Alcohol use: Not Currently   Drug use: Never     Medication list has been reviewed and updated.  Current Meds  Medication Sig   acetaminophen  (TYLENOL ) 500 MG tablet Take 500 mg by mouth every 4 (four) hours as needed.   atorvastatin  (LIPITOR) 10 MG tablet Take 1 tablet (10 mg total) by mouth daily.   Cholecalciferol (VITAMIN D -3) 125 MCG (5000 UT) TABS Take by mouth.   nadolol (CORGARD) 40 MG tablet Take 40 mg by mouth 2 (two) times daily.   propranolol  (INDERAL ) 20 MG tablet TAKE 1 TABLET(20 MG) BY MOUTH TWICE DAILY AS NEEDED   UNABLE TO FIND Med Name: Nutrafol women's vitamin   [DISCONTINUED] Cholecalciferol 50 MCG (2000 UT) CAPS Take 2,000 Units by mouth daily.       10/06/2023    3:58 PM 12/30/2022    4:26 PM 07/09/2022    2:22 PM 09/24/2021    4:47 PM  GAD 7 : Generalized Anxiety Score  Nervous, Anxious, on Edge 0 0 0 0  Control/stop worrying 0 0 0 0  Worry too much - different things 0 0 0 0  Trouble relaxing 0 0 0 0  Restless 0 0 0 0  Easily annoyed or irritable 0 0 0 0  Afraid - awful might happen 0 0 0 0  Total GAD 7 Score 0 0 0 0  Anxiety Difficulty Not difficult at all Not difficult at all Not difficult at all        10/06/2023    3:56 PM 12/30/2022    4:25 PM 07/09/2022    2:22 PM  Depression screen PHQ 2/9  Decreased Interest 0 0 0  Down, Depressed, Hopeless 0 0 0  PHQ - 2 Score 0 0 0  Altered sleeping 1 3 0  Tired, decreased energy 1 3 0  Change in appetite 0 0 0  Feeling bad or failure about yourself  0 0 0  Trouble concentrating 0 0 0  Moving slowly or fidgety/restless 0 0 0  Suicidal thoughts 0 0 0  PHQ-9 Score 2 6 0  Difficult doing work/chores Not difficult at all Not difficult at all Not difficult at all    BP Readings from Last 3  Encounters:  10/06/23 122/82  06/30/23 102/70  06/23/23 119/77    Physical Exam Vitals and nursing note reviewed.  Constitutional:      General: She is not in acute distress.    Appearance: Normal appearance. She is well-developed.  HENT:     Head: Normocephalic and atraumatic.  Neck:     Vascular: No carotid bruit.      Comments: Mild tenderness and fullness but no definite mass Cardiovascular:     Rate and Rhythm: Normal rate and regular rhythm.  Pulmonary:     Effort: Pulmonary effort is normal. No respiratory distress.     Breath sounds: No wheezing or rhonchi.  Musculoskeletal:     Cervical back: Normal range of motion. Tenderness (on left neck under jaw) present.     Right lower leg: No edema.     Left lower leg: No edema.  Lymphadenopathy:     Cervical: No cervical adenopathy.  Skin:    General: Skin is warm and dry.     Findings: No rash.  Neurological:     Mental Status: She is alert and oriented to person, place, and time.  Psychiatric:        Mood and Affect: Mood normal.        Behavior: Behavior normal.     Wt Readings from Last 3 Encounters:  10/06/23 222 lb 6 oz (100.9 kg)  06/30/23 212 lb 6.4 oz (96.3 kg)  06/23/23 220 lb (99.8 kg)    BP 122/82   Pulse 85   Ht 5' 5.92" (1.674 m)   Wt 222 lb 6 oz (100.9 kg)   LMP 06/26/2015 (Exact Date)   SpO2 99%   BMI 35.98 kg/m   Assessment and Plan:  Problem List Items Addressed This Visit       Unprioritized   Vitamin D  deficiency (Chronic)   Found by Dermatology - now on 5000 IUnits daily.      Relevant Orders   VITAMIN D  25 Hydroxy (Vit-D Deficiency, Fractures)   Hypercalcemia - Primary   Recurrence of high calcium  - not on any supplements other than Vitamin D  and Biotin Will recheck with PTH and vitamin D  and advise      Relevant Orders   PTH, Intact and Calcium    Prediabetes  Recent A1C 6.4 Discussed dietary changes and gradual weight loss      Other Visit Diagnoses        Lymphadenopathy, submandibular       no definite mass - suspect mild LA from allergies and PND for dry eye, use moisturizing drops qAM as needed re-evaluate at next visit       No follow-ups on file.    Sheron Dixons, MD [facility] [place]

## 2023-10-06 NOTE — Assessment & Plan Note (Signed)
 Found by Dermatology - now on 5000 IUnits daily.

## 2023-10-06 NOTE — Assessment & Plan Note (Signed)
 Recurrence of high calcium  - not on any supplements other than Vitamin D  and Biotin Will recheck with PTH and vitamin D  and advise

## 2023-10-06 NOTE — Assessment & Plan Note (Addendum)
 Recent A1C 6.4 Discussed dietary changes and gradual weight loss

## 2023-10-07 ENCOUNTER — Other Ambulatory Visit: Payer: Self-pay | Admitting: Internal Medicine

## 2023-10-07 ENCOUNTER — Encounter: Payer: Self-pay | Admitting: Internal Medicine

## 2023-10-07 DIAGNOSIS — R079 Chest pain, unspecified: Secondary | ICD-10-CM | POA: Diagnosis not present

## 2023-10-07 LAB — PTH, INTACT AND CALCIUM
Calcium: 10.7 mg/dL — ABNORMAL HIGH (ref 8.7–10.2)
PTH: 51 pg/mL (ref 15–65)

## 2023-10-07 LAB — VITAMIN D 25 HYDROXY (VIT D DEFICIENCY, FRACTURES): Vit D, 25-Hydroxy: 53.7 ng/mL (ref 30.0–100.0)

## 2023-10-08 NOTE — Telephone Encounter (Signed)
 Please review.  KP

## 2023-10-09 DIAGNOSIS — E213 Hyperparathyroidism, unspecified: Secondary | ICD-10-CM | POA: Diagnosis not present

## 2023-10-11 ENCOUNTER — Other Ambulatory Visit: Payer: Self-pay | Admitting: Internal Medicine

## 2023-10-11 DIAGNOSIS — R000 Tachycardia, unspecified: Secondary | ICD-10-CM

## 2023-10-13 NOTE — Telephone Encounter (Signed)
 Requested Prescriptions  Pending Prescriptions Disp Refills   propranolol  (INDERAL ) 20 MG tablet [Pharmacy Med Name: PROPRANOLOL  20MG  TABLETS] 180 tablet 1    Sig: TAKE 1 TABLET(20 MG) BY MOUTH TWICE DAILY AS NEEDED     Cardiovascular:  Beta Blockers Failed - 10/13/2023  1:52 PM      Failed - Valid encounter within last 6 months    Recent Outpatient Visits           1 week ago Hypercalcemia   [place] at [facility], Chales [place], MD       Future Appointments             In 3 weeks Gala Jubilee Chales [place], MD [facility] at [facility]            Passed - Last BP in normal range    BP Readings from Last 1 Encounters:  10/06/23 122/82         Passed - Last Heart Rate in normal range    Pulse Readings from Last 1 Encounters:  10/06/23 85

## 2023-10-16 ENCOUNTER — Encounter: Payer: Self-pay | Admitting: Internal Medicine

## 2023-10-16 NOTE — Telephone Encounter (Signed)
 Please review.  KP

## 2023-10-16 NOTE — Telephone Encounter (Signed)
 Please review

## 2023-10-26 DIAGNOSIS — R002 Palpitations: Secondary | ICD-10-CM | POA: Diagnosis not present

## 2023-10-30 DIAGNOSIS — R079 Chest pain, unspecified: Secondary | ICD-10-CM | POA: Diagnosis not present

## 2023-11-05 ENCOUNTER — Ambulatory Visit
Admission: EM | Admit: 2023-11-05 | Discharge: 2023-11-05 | Disposition: A | Discharge status: Home / Self Care | Attending: Emergency Medicine | Admitting: Emergency Medicine

## 2023-11-05 ENCOUNTER — Other Ambulatory Visit: Payer: Self-pay | Admitting: Internal Medicine

## 2023-11-05 ENCOUNTER — Encounter: Payer: Self-pay | Admitting: Internal Medicine

## 2023-11-05 ENCOUNTER — Encounter: Payer: Self-pay | Admitting: Emergency Medicine

## 2023-11-05 DIAGNOSIS — N390 Urinary tract infection, site not specified: Secondary | ICD-10-CM | POA: Diagnosis not present

## 2023-11-05 DIAGNOSIS — E782 Mixed hyperlipidemia: Secondary | ICD-10-CM

## 2023-11-05 LAB — URINALYSIS, W/ REFLEX TO CULTURE (INFECTION SUSPECTED)
Bilirubin Urine: NEGATIVE
Glucose, UA: NEGATIVE mg/dL
Ketones, ur: NEGATIVE mg/dL
Nitrite: NEGATIVE
RBC / HPF: >50 RBC/hpf (ref 0–5)
Specific Gravity, Urine: 1.015 (ref 1.005–1.030)
pH: 6 (ref 5.0–8.0)

## 2023-11-05 MED ORDER — NITROFURANTOIN MONOHYD MACRO 100 MG PO CAPS: 100.0000 mg | ORAL_CAPSULE | Freq: Two times a day (BID) | ORAL | 0 refills | Status: DC

## 2023-11-05 MED ORDER — PHENAZOPYRIDINE HCL 200 MG PO TABS: 200.0000 mg | ORAL_TABLET | Freq: Three times a day (TID) | ORAL | 0 refills | Status: DC

## 2023-11-05 NOTE — ED Triage Notes (Signed)
 Patient states that sx started yesterday. Discomfort when urinating.

## 2023-11-05 NOTE — ED Provider Notes (Signed)
 [facility]    CSN: [id number] Arrival date & time: 11/05/23  1833      History   Chief Complaint Chief Complaint  Patient presents with   Dysuria    HPI Brittany Baldwin is a 53 y.o. female.   HPI  53 year old female with past medical history significant for SVT, hypercalcemia, PE, hyperlipidemia, GERD, anemia, heart murmur, uterine fibroids, COPD, and dependent edema presents for evaluation of pain at the end of urination that started yesterday.  She describes it as a sharp ache but no burning.  No associated urgency or frequency.  She will have intermittent suprapubic tenderness following urination but it does not last.  It is not present currently.  She has not noticed any blood in her urine, denies low back pain, and denies fever.  Past Medical History:  Diagnosis Date   Anemia    H/O    Bilateral temporomandibular joint pain 09/24/2021   COPD (chronic obstructive pulmonary disease) (HCC)    Dependent edema 08/05/2016   Dysrhythmia    SVT-HAD CARDIAC ABLATION DONE IN 2016 BUT PT STATES SHE STILL GETS INTERMITTENT TACHYCARDIC AND IS SYPMPTOMATIC WITH SOB, DIZZINESS DURING TACHY EPISODES (06-25-15)   Elevated parathyroid hormone 07/03/2016   Fibroid    GERD (gastroesophageal reflux disease)    Headache    H/O   Heart murmur    as child   History of fundoplication 12/14/2014   History of pulmonary embolus during pregnancy 05/16/2015   2001   Hoarseness, persistent 10/06/2017   Hypercalcemia 01/18/2017   PTH, PTHrP, 1,25 vit D and 25 vit D, Thyroid  and Serum free light chains - normal  Repeat Calcium  WNL   Hyperlipemia    Motion sickness    cars, boats   PE (pulmonary thromboembolism) (HCC) 2000   while on bed rest during pregnancy   Plantar fasciitis, bilateral 08/05/2016   PONV (postoperative nausea and vomiting)    Shortness of breath dyspnea    ASSOCIATED WITH TACHYCARDIA   Status post abdominal hysterectomy 07/02/2015   Status post TAH, bilateral  salpingectomy. 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   Spondylosis of 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 06/14/2021   Osteophyte of left hip 04/19/2020   Acanthosis nigricans 07/30/2018   Hypercalcemia 01/18/2017   Constipation by delayed colonic transit 08/05/2016   Vocal cord nodules 02/13/2016   S/P total hysterectomy 07/02/2015   Tachycardia 05/10/2015   Gastroesophageal reflux disease 03/28/2014   Hyperlipidemia, mixed 03/28/2014    Past Surgical History:  Procedure Laterality Date   ABDOMINAL HYSTERECTOMY Bilateral 07/02/2015   Procedure: HYSTERECTOMY ABDOMINAL WITH BS;  Surgeon: Colan Dash, MD;  Location: [facility];  Service: Gynecology;  Laterality: Bilateral;   APPENDECTOMY     BIOPSY N/A 05/24/2020   Procedure: BIOPSY;  Surgeon: Selena Daily, MD;  Location: [facility];  Service: Endoscopy;  Laterality: N/A;   CARDIAC ELECTROPHYSIOLOGY STUDY AND ABLATION     CHOLECYSTECTOMY     COLONOSCOPY  2013   COLONOSCOPY WITH PROPOFOL  N/A 03/10/2017   Procedure: COLONOSCOPY WITH PROPOFOL ;  Surgeon: Selena Daily, MD;  Location: [facility];  Service: Endoscopy;  Laterality: N/A;   CYSTOSCOPY Bilateral 07/02/2015   Procedure: CYSTOSCOPY;  Surgeon: Colan Dash, MD;  Location: [facility];  Service: Gynecology;  Laterality: Bilateral;   ESOPHAGEAL DILATION  03/10/2017   Procedure: ESOPHAGEAL DILATION;  Surgeon: Selena Daily, MD;  Location: [facility];  Service: Endoscopy;;   ESOPHAGEAL MANOMETRY N/A  07/24/2021   Procedure: ESOPHAGEAL MANOMETRY (EM);  Surgeon: Sergio Dandy, MD;  Location: [facility];  Service: Endoscopy;  Laterality: N/A;   ESOPHAGOGASTRODUODENOSCOPY N/A 03/10/2017   Procedure: ESOPHAGOGASTRODUODENOSCOPY (EGD);  Surgeon: Selena Daily, MD;  Location: [facility];  Service: Endoscopy;  Laterality: N/A;   ESOPHAGOGASTRODUODENOSCOPY (EGD) WITH PROPOFOL  N/A 07/01/2019   Procedure: ESOPHAGOGASTRODUODENOSCOPY (EGD) WITH PROPOFOL ;  Surgeon: Selena Daily, MD;  Location: [facility];  Service: Endoscopy;  Laterality: N/A;   ESOPHAGOGASTRODUODENOSCOPY (EGD) WITH PROPOFOL  N/A 05/24/2020   Procedure: ESOPHAGOGASTRODUODENOSCOPY (EGD) WITH PROPOFOL ;  Surgeon: Selena Daily, MD;  Location: [facility];  Service: Endoscopy;  Laterality: N/A;   ESOPHAGOGASTRODUODENOSCOPY (EGD) WITH PROPOFOL  N/A 06/30/2023   Procedure: ESOPHAGOGASTRODUODENOSCOPY (EGD) WITH PROPOFOL  WITH BIOPSY;  Surgeon: Selena Daily, MD;  Location: [facility];  Service: Endoscopy;  Laterality: N/A;   LAPAROTOMY N/A 07/11/2015   Procedure: hematoma evacuation;  Surgeon: Colan Dash, MD;  Location: [facility];  Service: Gynecology;  Laterality: N/A;   NISSEN FUNDOPLICATION     TUBAL LIGATION      OB History     Gravida  8   Para  3   Term  1   Preterm  2   AB  5   Living  1      SAB  4   IAB  1   Ectopic      Multiple      Live Births  1            Home Medications    Prior to Admission medications   Medication Sig Start Date End Date Taking? Authorizing Provider  atorvastatin  (LIPITOR) 10 MG tablet Take 1 tablet (10 mg total) by mouth daily. 07/28/23  Yes Sheron Dixons, MD  Cholecalciferol (VITAMIN D -3) 125 MCG (5000 UT) TABS Take by mouth.   Yes [provider]  nadolol (CORGARD) 40 MG tablet Take 40 mg by mouth 2 (two) times daily.   Yes [provider]  nitrofurantoin, macrocrystal-monohydrate,  (MACROBID) 100 MG capsule Take 1 capsule (100 mg total) by mouth 2 (two) times daily. 11/05/23  Yes Kent Pear, NP  phenazopyridine (PYRIDIUM) 200 MG tablet Take 1 tablet (200 mg total) by mouth 3 (three) times daily. 11/05/23  Yes Kent Pear, NP  propranolol  (INDERAL ) 20 MG tablet TAKE 1 TABLET(20 MG) BY MOUTH TWICE DAILY AS NEEDED 10/13/23  Yes Sheron Dixons, MD  acetaminophen  (TYLENOL ) 500 MG tablet Take 500 mg by mouth every 4 (four) hours as needed.    [provider]  UNABLE TO FIND Med Name: Nutrafol women's vitamin    [provider]    Family History Family History  Problem Relation Age of Onset   Diabetes Mother    Prostate cancer Father    Congestive Heart Failure Father    Heart failure Father    Cancer Neg Hx     Social History Social History   Tobacco Use   Smoking status: Never   Smokeless tobacco: Never  Vaping Use   Vaping status: Never Used  Substance Use Topics  Alcohol use: Not Currently   Drug use: Never     Allergies   Codeine, Aspirin, Diphenhydramine hcl, Adhesive [tape], and Hydrocodone   Review of Systems Review of Systems  Constitutional:  Negative for fever.  Gastrointestinal:  Positive for abdominal pain. Negative for nausea and vomiting.  Genitourinary:  Positive for dysuria. Negative for frequency, hematuria and urgency.  Musculoskeletal:  Negative for back pain.     Physical Exam Triage Vital Signs ED Triage Vitals  Encounter Vitals Group     BP      Systolic BP Percentile      Diastolic BP Percentile      Pulse      Resp      Temp      Temp src      SpO2      Weight      Height      Head Circumference      Peak Flow      Pain Score      Pain Loc      Pain Education      Exclude from Growth Chart    No data found.  Updated Vital Signs BP 133/86 (BP Location: Right Arm)   Pulse 64   Temp 98.5 F (36.9 C) (Oral)   Resp 15   LMP 06/26/2015 (Exact Date)   SpO2 99%   Visual Acuity Right Eye  Distance:   Left Eye Distance:   Bilateral Distance:    Right Eye Near:   Left Eye Near:    Bilateral Near:     Physical Exam Vitals and nursing note reviewed.  Constitutional:      Appearance: Normal appearance. She is not ill-appearing.  HENT:     Head: Normocephalic and atraumatic.  Cardiovascular:     Rate and Rhythm: Normal rate and regular rhythm.     Pulses: Normal pulses.     Heart sounds: Normal heart sounds. No murmur heard.    No friction rub. No gallop.  Pulmonary:     Effort: Pulmonary effort is normal.     Breath sounds: Normal breath sounds. No wheezing, rhonchi or rales.  Abdominal:     Tenderness: There is no right CVA tenderness or left CVA tenderness.  Skin:    General: Skin is warm and dry.     Capillary Refill: Capillary refill takes less than 2 seconds.     Findings: No rash.  Neurological:     General: No focal deficit present.     Mental Status: She is alert and oriented to person, place, and time.      UC Treatments / Results  Labs (all labs ordered are listed, but only abnormal results are displayed) Labs Reviewed  URINALYSIS, W/ REFLEX TO CULTURE (INFECTION SUSPECTED) - Abnormal; Notable for the following components:      Result Value   APPearance HAZY (*)    Hgb urine dipstick LARGE (*)    Protein, ur TRACE (*)    Leukocytes,Ua SMALL (*)    Bacteria, UA FEW (*)    All other components within normal limits  URINE CULTURE    EKG   Radiology No results found.  Procedures Procedures (including critical care time)  Medications Ordered in UC Medications - No data to display  Initial Impression / Assessment and Plan / UC Course  I have reviewed the triage vital signs and the nursing notes.  Pertinent labs & imaging results that were available during my care of the patient  were reviewed by me and considered in my medical decision making (see chart for details).   Patient is a pleasant, nontoxic-appearing 52 year old female  presenting of UTI symptoms as outlined HPI above.  Symptoms started abruptly yesterday.  No associated hematuria, fever, nausea, or vomiting.  On exam patient has no CVA tenderness.  I will order a urinalysis to assess for the presence of UTI.  Urinalysis shows hazy appearance with large hemoglobin, trace protein, small leukocyte esterase.  Negative for nitrites or ketones.  11-20 WBCs with greater than 50 RBCs and few bacteria.  Urine will reflex to culture.  I will discharge patient with a diagnosis of urinary tract infection and start her on Macrobid twice daily for 5 days.  Also Pyridium every 8 hours help with urinary discomfort.  Return precautions reviewed.   Final Clinical Impressions(s) / UC Diagnoses   Final diagnoses:  Lower urinary tract infectious disease     Discharge Instructions      Take the Macrobid twice daily for 5 days with food for treatment of urinary tract infection.  Use the Pyridium every 8 hours as needed for urinary discomfort.  This will turn your urine a bright red-orange.  Increase your oral fluid intake so that you increase your urine production and or flushing your urinary system.  Take an over-the-counter probiotic, such as Culturelle-Align-Activia, 1 hour after each dose of antibiotic to prevent diarrhea or yeast infections from forming.  We will culture urine and change the antibiotics if necessary.  Return for reevaluation, or see your primary care provider, for any new or worsening symptoms.    ED Prescriptions     Medication Sig Dispense Auth. Provider   nitrofurantoin, macrocrystal-monohydrate, (MACROBID) 100 MG capsule Take 1 capsule (100 mg total) by mouth 2 (two) times daily. 10 capsule Kent Pear, NP   phenazopyridine (PYRIDIUM) 200 MG tablet Take 1 tablet (200 mg total) by mouth 3 (three) times daily. 6 tablet Kent Pear, NP      PDMP not reviewed this encounter.   Kent Pear, NP 11/05/23 1919

## 2023-11-05 NOTE — Discharge Instructions (Addendum)

## 2023-11-06 LAB — URINE CULTURE: Culture: <10000 — AB

## 2023-11-06 NOTE — Telephone Encounter (Signed)
Please review medication refill  request

## 2023-11-06 NOTE — Telephone Encounter (Signed)
 Requested medication (s) are due for refill today:   No requesting too soon.  Requested medication (s) are on the active medication list:   Yes  Future visit scheduled:   Yes 9/15     LOV 10/06/2023   Last ordered: 07/28/2023 #90, 1 refill  Unable to refill because lipid panel is due.   Last one was in 2022.      Requested Prescriptions  Pending Prescriptions Disp Refills   atorvastatin  (LIPITOR) 10 MG tablet [Pharmacy Med Name: ATORVASTATIN  10MG  TABLETS] 90 tablet 1    Sig: TAKE 1 TABLET(10 MG) BY MOUTH DAILY     Cardiovascular:  Antilipid - Statins Failed - 11/06/2023  2:25 PM      Failed - Valid encounter within last 12 months    Recent Outpatient Visits           1 month ago Hypercalcemia   [place] [facility] at [facility], Chales [place], MD       Future Appointments             In 3 months Sheron Dixons, MD [facility] at [facility]            Failed - Lipid Panel in normal range within the last 12 months    Cholesterol, Total  Date Value Ref Range Status  09/03/2020 149 100 - 199 mg/dL Final   LDL Chol Calc (NIH)  Date Value Ref Range Status  09/03/2020 86 0 - 99 mg/dL Final   HDL  Date Value Ref Range Status  09/03/2020 50 >39 mg/dL Final   Triglycerides  Date Value Ref Range Status  09/03/2020 64 0 - 149 mg/dL Final         Passed - Patient is not pregnant

## 2023-11-09 ENCOUNTER — Ambulatory Visit (HOSPITAL_COMMUNITY): Payer: Self-pay

## 2023-11-09 ENCOUNTER — Encounter: Payer: Federal, State, Local not specified - PPO | Admitting: Internal Medicine

## 2023-11-18 ENCOUNTER — Ambulatory Visit: Admitting: Internal Medicine

## 2023-11-18 ENCOUNTER — Other Ambulatory Visit: Payer: Self-pay | Admitting: Internal Medicine

## 2023-11-18 ENCOUNTER — Encounter: Payer: Self-pay | Admitting: Internal Medicine

## 2023-11-18 VITALS — BP 120/74 | HR 94 | Ht 65.92 in | Wt 224.0 lb

## 2023-11-18 DIAGNOSIS — K5904 Chronic idiopathic constipation: Secondary | ICD-10-CM | POA: Diagnosis not present

## 2023-11-18 DIAGNOSIS — R399 Unspecified symptoms and signs involving the genitourinary system: Secondary | ICD-10-CM

## 2023-11-18 LAB — POCT URINALYSIS DIPSTICK
Bilirubin, UA: NEGATIVE
Glucose, UA: NEGATIVE
Ketones, UA: NEGATIVE
Leukocytes, UA: NEGATIVE
Nitrite, UA: NEGATIVE
Protein, UA: NEGATIVE
Spec Grav, UA: 1.02 (ref 1.010–1.025)
Urobilinogen, UA: 0.2 U/dL
pH, UA: 5 (ref 5.0–8.0)

## 2023-11-18 NOTE — Progress Notes (Signed)
 Date:  11/18/2023   Name:  Brittany Baldwin   DOB:  [birth year]   MRN:  [id number]   Chief Complaint: Urinary Tract Infection (Pt said that she seen UC on 06/5 and was told that her Urine Culture was negative so she can stop abx. She was prescribed Macrobid  but the symptoms never got better, even while taking the medicine. She is still having dysuria and pelvis discomfort. )  Dysuria  This is a new problem. The current episode started in the past 7 days. The problem occurs every urination. The problem has been unchanged. The quality of the pain is described as aching. The pain is mild. There has been no fever. Pertinent negatives include no chills, discharge, flank pain, hematuria, hesitancy, nausea, urgency or vomiting. She has tried antibiotics for the symptoms. The treatment provided no relief.  UCx <10,0000 col/ml.  Macrobid  stopped.  No change in symptoms noted anyway.  Also took AZO with no change.   Review of Systems  Constitutional:  Negative for chills, fatigue and fever.  Respiratory:  Negative for chest tightness and shortness of breath.   Cardiovascular:  Negative for chest pain and palpitations.  Gastrointestinal:  Positive for abdominal distention and constipation. Negative for nausea and vomiting.  Genitourinary:  Positive for dysuria. Negative for flank pain, genital sores, hematuria, hesitancy, pelvic pain and urgency.     Lab Results  Component Value Date   NA 140 10/05/2023   K 4.8 10/05/2023   CO2 25 (A) 10/05/2023   GLUCOSE 103 (H) 09/03/2020   BUN 9 10/05/2023   CREATININE 0.9 10/05/2023   CALCIUM  10.7 (H) 10/06/2023   EGFR 82 10/05/2023   GFRNONAA 70 06/29/2019   Lab Results  Component Value Date   CHOL 149 09/03/2020   HDL 50 09/03/2020   LDLCALC 86 09/03/2020   TRIG 64 09/03/2020   CHOLHDL 3.0 09/03/2020   Lab Results  Component Value Date   TSH 3.00 10/05/2023   Lab Results  Component Value Date   HGBA1C 6.4 10/05/2023   Lab Results  Component  Value Date   WBC 6.1 10/05/2023   HGB 13.5 10/05/2023   HCT 40 10/05/2023   MCV 87 09/03/2020   PLT 218 10/05/2023   Lab Results  Component Value Date   ALT 17 10/05/2023   AST 23 10/05/2023   ALKPHOS 65 10/05/2023   BILITOT 0.7 09/03/2020   Lab Results  Component Value Date   VD25OH 53.7 10/06/2023     Patient Active Problem List   Diagnosis Date Noted   Vitamin D  deficiency 10/06/2023   Prediabetes 12/30/2022   Mild intermittent asthma without complication 12/30/2022   Environmental and seasonal allergies 04/10/2022   Greater trochanteric pain syndrome of right lower extremity 07/25/2021   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 Drip in addition to other meds. Caused hallucinations   Adhesive [Tape] Rash    ELECTRODES FROM  HOLTER MONITOR CAUSED RASH   Hydrocodone Rash    Past Surgical History:  Procedure Laterality Date   ABDOMINAL HYSTERECTOMY Bilateral 07/02/2015   Procedure: HYSTERECTOMY ABDOMINAL WITH BS;  Surgeon: Colan Dash, MD;  Location: [facility];  Service: Gynecology;  Laterality: Bilateral;   APPENDECTOMY     BIOPSY N/A 05/24/2020   Procedure: BIOPSY;  Surgeon: Selena Daily, MD;  Location: [facility];  Service: Endoscopy;  Laterality: N/A;   CARDIAC ELECTROPHYSIOLOGY STUDY AND ABLATION     CHOLECYSTECTOMY     COLONOSCOPY  2013    COLONOSCOPY WITH PROPOFOL  N/A 03/10/2017   Procedure: COLONOSCOPY WITH PROPOFOL ;  Surgeon: Selena Daily, MD;  Location: [facility];  Service: Endoscopy;  Laterality: N/A;   CYSTOSCOPY Bilateral 07/02/2015   Procedure: CYSTOSCOPY;  Surgeon: Colan Dash, MD;  Location: [facility];  Service: Gynecology;  Laterality: Bilateral;   ESOPHAGEAL DILATION  03/10/2017   Procedure: ESOPHAGEAL DILATION;  Surgeon: Selena Daily, MD;  Location: [facility];  Service: Endoscopy;;   ESOPHAGEAL MANOMETRY N/A 07/24/2021   Procedure: ESOPHAGEAL MANOMETRY (EM);  Surgeon: Sergio Dandy, MD;  Location: [facility];  Service: Endoscopy;  Laterality: N/A;   ESOPHAGOGASTRODUODENOSCOPY N/A 03/10/2017   Procedure: ESOPHAGOGASTRODUODENOSCOPY (EGD);  Surgeon: Selena Daily, MD;  Location: [facility];  Service: Endoscopy;  Laterality: N/A;   ESOPHAGOGASTRODUODENOSCOPY (EGD) WITH PROPOFOL  N/A 07/01/2019   Procedure: ESOPHAGOGASTRODUODENOSCOPY (EGD) WITH PROPOFOL ;  Surgeon: Selena Daily, MD;  Location: [facility];  Service: Endoscopy;  Laterality: N/A;   ESOPHAGOGASTRODUODENOSCOPY (EGD) WITH PROPOFOL  N/A 05/24/2020   Procedure: ESOPHAGOGASTRODUODENOSCOPY (EGD) WITH PROPOFOL ;  Surgeon: Selena Daily, MD;  Location: [facility];  Service: Endoscopy;  Laterality: N/A;   ESOPHAGOGASTRODUODENOSCOPY (EGD) WITH PROPOFOL  N/A 06/30/2023   Procedure: ESOPHAGOGASTRODUODENOSCOPY (EGD) WITH PROPOFOL  WITH BIOPSY;  Surgeon: Selena Daily, MD;  Location: [facility];  Service: Endoscopy;  Laterality: N/A;   LAPAROTOMY N/A 07/11/2015   Procedure: hematoma evacuation;  Surgeon: Colan Dash, MD;  Location: [facility];  Service: Gynecology;  Laterality: N/A;   NISSEN FUNDOPLICATION     TUBAL LIGATION      Social History   Tobacco Use   Smoking status: Never   Smokeless tobacco: Never  Vaping Use   Vaping status: Never Used  Substance Use  Topics   Alcohol use: Not Currently   Drug use: Never     Medication list has been reviewed and updated.  Current Meds  Medication Sig   acetaminophen  (TYLENOL ) 500 MG tablet Take 500 mg by mouth every 4 (four) hours as needed.   atorvastatin  (LIPITOR) 10 MG tablet TAKE 1 TABLET(10 MG) BY MOUTH DAILY   Cholecalciferol (VITAMIN D -3) 125 MCG (5000 UT) TABS Take by mouth.   nadolol (CORGARD) 40 MG tablet Take 40 mg by mouth 2 (two) times daily.   phenazopyridine  (PYRIDIUM ) 200 MG tablet Take 1 tablet (200 mg total) by mouth 3 (three) times daily.   propranolol  (INDERAL ) 20 MG tablet TAKE 1 TABLET(20 MG) BY MOUTH TWICE DAILY AS NEEDED   UNABLE TO FIND Med Name: Nutrafol women's vitamin       10/06/2023    3:58 PM 12/30/2022    4:26 PM 07/09/2022    2:22 PM 09/24/2021    4:47 PM  GAD 7 : Generalized Anxiety Score  Nervous, Anxious, on Edge 0 0 0 0  Control/stop worrying 0 0 0 0  Worry too much - different things 0 0 0  0  Trouble relaxing 0 0 0 0  Restless 0 0 0 0  Easily annoyed or irritable 0 0 0 0  Afraid - awful might happen 0 0 0 0  Total GAD 7 Score 0 0 0 0  Anxiety Difficulty Not difficult at all Not difficult at all Not difficult at all        10/06/2023    3:56 PM 12/30/2022    4:25 PM 07/09/2022    2:22 PM  Depression screen PHQ 2/9  Decreased Interest 0 0 0  Down, Depressed, Hopeless 0 0 0  PHQ - 2 Score 0 0 0  Altered sleeping 1 3 0  Tired, decreased energy 1 3 0  Change in appetite 0 0 0  Feeling bad or failure about yourself  0 0 0  Trouble concentrating 0 0 0  Moving slowly or fidgety/restless 0 0 0  Suicidal thoughts 0 0 0  PHQ-9 Score 2 6 0  Difficult doing work/chores Not difficult at all Not difficult at all Not difficult at all    BP Readings from Last 3 Encounters:  11/18/23 120/74  11/05/23 133/86  10/06/23 122/82    Physical Exam Vitals and nursing note reviewed.  Constitutional:      General: She is not in acute distress.    Appearance: Normal  appearance. She is well-developed.  HENT:     Head: Normocephalic and atraumatic.   Cardiovascular:     Rate and Rhythm: Normal rate and regular rhythm.  Pulmonary:     Effort: Pulmonary effort is normal. No respiratory distress.     Breath sounds: No wheezing or rhonchi.  Abdominal:     General: Abdomen is protuberant. Bowel sounds are decreased.     Palpations: Abdomen is soft.     Tenderness: There is generalized abdominal tenderness. There is no right CVA tenderness or left CVA tenderness.     Comments: Tender esp in RLQ and upper abdomen   Skin:    General: Skin is warm and dry.     Findings: No rash.   Neurological:     Mental Status: She is alert and oriented to person, place, and time.   Psychiatric:        Mood and Affect: Mood normal.        Behavior: Behavior normal.     Wt Readings from Last 3 Encounters:  11/18/23 224 lb (101.6 kg)  10/06/23 222 lb 6 oz (100.9 kg)  06/30/23 212 lb 6.4 oz (96.3 kg)    BP 120/74   Pulse 94   Ht 5' 5.92 (1.674 m)   Wt 224 lb (101.6 kg)   LMP 06/26/2015 (Exact Date)   SpO2 99%   BMI 36.24 kg/m   Assessment and Plan:  Problem List Items Addressed This Visit   None Visit Diagnoses       UTI symptoms    -  Primary   UA shows on RBCs will get culture; can take AZO if helpful Cranberry juice; may need to see Urology   Relevant Orders   Urine Culture   POCT urinalysis dipstick (Completed)     Chronic idiopathic constipation       constipation may be contributing to abdominal discomfort recommend daily Miralax  to help regulate       No follow-ups on file.    Sheron Dixons, MD [facility] [place]

## 2023-11-20 ENCOUNTER — Ambulatory Visit: Payer: Self-pay | Admitting: Internal Medicine

## 2023-11-20 ENCOUNTER — Other Ambulatory Visit: Payer: Self-pay

## 2023-11-20 DIAGNOSIS — N3000 Acute cystitis without hematuria: Secondary | ICD-10-CM

## 2023-11-20 DIAGNOSIS — R3129 Other microscopic hematuria: Secondary | ICD-10-CM

## 2023-11-20 LAB — URINE CULTURE

## 2023-12-11 ENCOUNTER — Other Ambulatory Visit: Payer: Self-pay

## 2023-12-11 DIAGNOSIS — R3129 Other microscopic hematuria: Secondary | ICD-10-CM

## 2023-12-15 ENCOUNTER — Ambulatory Visit: Admitting: Urology

## 2023-12-15 ENCOUNTER — Other Ambulatory Visit
Admission: RE | Admit: 2023-12-15 | Discharge: 2023-12-15 | Disposition: A | Discharge status: Home / Self Care | Attending: Urology | Admitting: Urology

## 2023-12-15 VITALS — BP 130/89 | HR 83 | Ht 66.0 in | Wt 223.0 lb

## 2023-12-15 DIAGNOSIS — R3129 Other microscopic hematuria: Secondary | ICD-10-CM | POA: Diagnosis not present

## 2023-12-15 DIAGNOSIS — R102 Pelvic and perineal pain: Secondary | ICD-10-CM | POA: Diagnosis not present

## 2023-12-15 LAB — URINALYSIS, COMPLETE (UACMP) WITH MICROSCOPIC
Bilirubin Urine: NEGATIVE
Glucose, UA: NEGATIVE mg/dL
Ketones, ur: NEGATIVE mg/dL
Nitrite: NEGATIVE
Protein, ur: NEGATIVE mg/dL
Specific Gravity, Urine: 1.02 (ref 1.005–1.030)
pH: 6 (ref 5.0–8.0)

## 2023-12-15 NOTE — Progress Notes (Signed)
 12/15/23 3:53 PM   Brittany Baldwin 15-Apr-1971 [id number]  CC: Dysuria  HPI: 53 year old female who was seen in urgent care on 11/05/2023 with dysuria.  Urinalysis at that time showed 11-20 WBC, greater than 50 RBC, few bacteria, small leukocytes.  This was sent for culture which was ultimately negative.  She was treated with antibiotics.  Her symptoms have improved and really denies significant urinary complaints at this time aside from some vague mild abdominal discomfort.  She denies any gross hematuria.  Urinalysis today is benign with 20-50 squamous cells, 0-5 WBC, 0-5 RBC, few bacteria.  PMH: Past Medical History:  Diagnosis Date   Anemia    H/O    Bilateral temporomandibular joint pain 09/24/2021   COPD (chronic obstructive pulmonary disease) (HCC)    Dependent edema 08/05/2016   Dysrhythmia    SVT-HAD CARDIAC ABLATION DONE IN 2016 BUT PT STATES SHE STILL GETS INTERMITTENT TACHYCARDIC AND IS SYPMPTOMATIC WITH SOB, DIZZINESS DURING TACHY EPISODES (06-25-15)   Elevated parathyroid hormone 07/03/2016   Fibroid    GERD (gastroesophageal reflux disease)    Headache    H/O   Heart murmur    as child   History of fundoplication 12/14/2014   History of pulmonary embolus during pregnancy 05/16/2015   2001   Hoarseness, persistent 10/06/2017   Hypercalcemia 01/18/2017   PTH, PTHrP, 1,25 vit D and 25 vit D, Thyroid  and Serum free light chains - normal  Repeat Calcium  WNL   Hyperlipemia    Motion sickness    cars, boats   PE (pulmonary thromboembolism) (HCC) 2000   while on bed rest during pregnancy   Plantar fasciitis, bilateral 08/05/2016   PONV (postoperative nausea and vomiting)    Shortness of breath dyspnea    ASSOCIATED WITH TACHYCARDIA   Status post abdominal hysterectomy 07/02/2015   Status post TAH, bilateral salpingectomy. PATHOLOGY: Uterine fibroids and adenomyosis    SVT (supraventricular tachycardia) (HCC)    Tachycardia     Surgical History: Past Surgical  History:  Procedure Laterality Date   ABDOMINAL HYSTERECTOMY Bilateral 07/02/2015   Procedure: HYSTERECTOMY ABDOMINAL WITH BS;  Surgeon: Gladis DELENA Dollar, MD;  Location: [facility];  Service: Gynecology;  Laterality: Bilateral;   APPENDECTOMY     BIOPSY N/A 05/24/2020   Procedure: BIOPSY;  Surgeon: Unk Corinn Skiff, MD;  Location: [facility];  Service: Endoscopy;  Laterality: N/A;   CARDIAC ELECTROPHYSIOLOGY STUDY AND ABLATION     CHOLECYSTECTOMY     COLONOSCOPY  2013   COLONOSCOPY WITH PROPOFOL  N/A 03/10/2017   Procedure: COLONOSCOPY WITH PROPOFOL ;  Surgeon: Unk Corinn Skiff, MD;  Location: [facility];  Service: Endoscopy;  Laterality: N/A;   CYSTOSCOPY Bilateral 07/02/2015   Procedure: CYSTOSCOPY;  Surgeon: Gladis DELENA Dollar, MD;  Location: [facility];  Service: Gynecology;  Laterality: Bilateral;   ESOPHAGEAL DILATION  03/10/2017   Procedure: ESOPHAGEAL DILATION;  Surgeon: Unk Corinn Skiff, MD;  Location: [facility];  Service: Endoscopy;;   ESOPHAGEAL MANOMETRY N/A 07/24/2021   Procedure: ESOPHAGEAL MANOMETRY (EM);  Surgeon: Shila Gustav GAILS, MD;  Location: [facility];  Service: Endoscopy;  Laterality: N/A;   ESOPHAGOGASTRODUODENOSCOPY N/A 03/10/2017   Procedure: ESOPHAGOGASTRODUODENOSCOPY (EGD);  Surgeon: Unk Corinn Skiff, MD;  Location: [facility];  Service: Endoscopy;  Laterality: N/A;   ESOPHAGOGASTRODUODENOSCOPY (EGD) WITH PROPOFOL  N/A 07/01/2019   Procedure: ESOPHAGOGASTRODUODENOSCOPY (EGD) WITH PROPOFOL ;  Surgeon: Unk Corinn Skiff, MD;  Location: [facility];  Service: Endoscopy;  Laterality: N/A;   ESOPHAGOGASTRODUODENOSCOPY (EGD) WITH  PROPOFOL  N/A 05/24/2020   Procedure: ESOPHAGOGASTRODUODENOSCOPY (EGD) WITH PROPOFOL ;  Surgeon: Unk Corinn Skiff, MD;  Location: [facility];  Service: Endoscopy;  Laterality: N/A;   ESOPHAGOGASTRODUODENOSCOPY (EGD) WITH PROPOFOL  N/A 06/30/2023   Procedure:  ESOPHAGOGASTRODUODENOSCOPY (EGD) WITH PROPOFOL  WITH BIOPSY;  Surgeon: Unk Corinn Skiff, MD;  Location: [facility];  Service: Endoscopy;  Laterality: N/A;   LAPAROTOMY N/A 07/11/2015   Procedure: hematoma evacuation;  Surgeon: Gladis DELENA Dollar, MD;  Location: [facility];  Service: Gynecology;  Laterality: N/A;   NISSEN FUNDOPLICATION     TUBAL LIGATION       Family History: Family History  Problem Relation Age of Onset   Diabetes Mother    Prostate cancer Father    Congestive Heart Failure Father    Heart failure Father    Cancer Neg Hx     Social History:  reports that she has never smoked. She has never used smokeless tobacco. She reports that she does not currently use alcohol. She reports that she does not use drugs.  Physical Exam: BP 130/89 (BP Location: Left Arm, Patient Position: Sitting, Cuff Size: Normal)   Pulse 83   Ht 5' 6 (1.676 m)   Wt 223 lb (101.2 kg)   LMP 06/26/2015 (Exact Date)   SpO2 97%   BMI 35.99 kg/m    Constitutional:  Alert and oriented, No acute distress. Cardiovascular: No clubbing, cyanosis, or edema. Respiratory: Normal respiratory effort, no increased work of breathing. GI: Abdomen is soft, nontender, nondistended, no abdominal masses   Laboratory Data: Reviewed, see HPI  Assessment & Plan:   53 year old female with episode of microscopic hematuria at the time of symptoms with dysuria and pelvic pain.  We discussed possible etiologies including infection, nephrolithiasis.  Urinalysis today is completely benign.  I do not feel she warrants further evaluation with ultrasound, CT, or cystoscopy at this time with her resolution of symptoms and benign urinalysis.  Return precautions were discussed.   Redell Burnet, MD 12/15/2023  [facility] [address] [place], [place] [address] [phone]

## 2023-12-15 NOTE — Patient Instructions (Signed)
 Brittany Baldwin

## 2023-12-29 ENCOUNTER — Telehealth: Payer: Self-pay | Admitting: Urology

## 2023-12-29 NOTE — Telephone Encounter (Signed)
 Pt said her symptoms are not improving with the exercises that were on her AVS from last visit.  She would like to proceed with imaging and/or cysto.

## 2023-12-30 NOTE — Telephone Encounter (Signed)
 Please schedule w/ PA per [facility]

## 2023-12-30 NOTE — Telephone Encounter (Signed)
Please advise on next steps.

## 2023-12-31 NOTE — Telephone Encounter (Signed)
 S/W pt and she is still having abdominal pain.  She is going to continue her exercises and see if symptoms improve.  If not, she will call back to schedule appt with a PA.

## 2024-02-08 ENCOUNTER — Ambulatory Visit: Admitting: Internal Medicine

## 2024-02-15 ENCOUNTER — Ambulatory Visit: Admitting: Family Medicine

## 2024-02-15 ENCOUNTER — Encounter: Payer: Self-pay | Admitting: Family Medicine

## 2024-02-15 ENCOUNTER — Encounter: Admitting: Internal Medicine

## 2024-02-15 VITALS — BP 106/74 | HR 91 | Ht 66.0 in | Wt 229.0 lb

## 2024-02-15 DIAGNOSIS — G8929 Other chronic pain: Secondary | ICD-10-CM

## 2024-02-15 DIAGNOSIS — M542 Cervicalgia: Secondary | ICD-10-CM

## 2024-02-15 DIAGNOSIS — M47817 Spondylosis without myelopathy or radiculopathy, lumbosacral region: Secondary | ICD-10-CM

## 2024-02-15 DIAGNOSIS — M7918 Myalgia, other site: Secondary | ICD-10-CM | POA: Diagnosis not present

## 2024-02-15 MED ORDER — MELOXICAM 15 MG PO TABS: 15.0000 mg | ORAL_TABLET | Freq: Every day | ORAL | 0 refills | Status: DC

## 2024-02-16 DIAGNOSIS — M542 Cervicalgia: Secondary | ICD-10-CM | POA: Insufficient documentation

## 2024-02-16 NOTE — Assessment & Plan Note (Signed)
 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 No significant nerve involvement. - Start meloxicam  15 mg once daily with food. - Refer to physical therapy for muscle strength and flexibility. - Order x-ray of the neck. - Follow-up in two months for symptom reassessment and possible advanced imaging.

## 2024-02-17 DIAGNOSIS — G8929 Other chronic pain: Secondary | ICD-10-CM | POA: Insufficient documentation

## 2024-02-17 NOTE — Patient Instructions (Signed)
 Patient Plan  - Take meloxicam  (Mobic ) 15 mg once daily with food for pain and inflammation. - Complete the ordered x-ray of your neck. - Start physical therapy for neck and back pain management, focusing on strength and flexibility. - Follow up in two months to reassess symptoms and discuss next steps, including possible advanced imaging or medication changes. - If pain persists after trying anti-inflammatory medication and physical therapy, discuss the option of starting Cymbalta.  Red flags - seek care right away if you notice:  - New or worsening numbness, tingling, or weakness in your arms or legs - Loss of bladder or bowel control - Severe or sudden neck or back pain - Inability to move your neck or back - Any new or concerning symptoms
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
## 2024-02-17 NOTE — Assessment & Plan Note (Signed)
 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 Discussed potential use of Cymbalta for pain modulation. She prefers anti-inflammatory treatment first. - Start meloxicam  15 mg once daily with food. - Discuss Cymbalta if pain persists after anti-inflammatory treatment and physical therapy. - Follow-up in two months to reassess symptoms and consider Cymbalta.
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
## 2024-02-24 ENCOUNTER — Ambulatory Visit
Admission: RE | Admit: 2024-02-24 | Discharge: 2024-02-24 | Disposition: A | Discharge status: Home / Self Care | Source: Ambulatory Visit | Attending: Family Medicine | Admitting: Family Medicine

## 2024-02-24 ENCOUNTER — Ambulatory Visit
Admission: RE | Admit: 2024-02-24 | Discharge: 2024-02-24 | Disposition: A | Discharge status: Home / Self Care | Attending: Family Medicine | Admitting: Family Medicine

## 2024-02-24 DIAGNOSIS — M542 Cervicalgia: Secondary | ICD-10-CM | POA: Diagnosis not present

## 2024-02-24 DIAGNOSIS — G8929 Other chronic pain: Secondary | ICD-10-CM | POA: Diagnosis not present

## 2024-02-29 DIAGNOSIS — Z01419 Encounter for gynecological examination (general) (routine) without abnormal findings: Secondary | ICD-10-CM | POA: Diagnosis not present

## 2024-03-01 ENCOUNTER — Encounter: Payer: Self-pay | Admitting: Family Medicine

## 2024-03-01 ENCOUNTER — Ambulatory Visit: Payer: Self-pay | Admitting: Family Medicine

## 2024-03-02 ENCOUNTER — Other Ambulatory Visit: Payer: Self-pay

## 2024-03-02 DIAGNOSIS — M542 Cervicalgia: Secondary | ICD-10-CM

## 2024-03-02 DIAGNOSIS — M47817 Spondylosis without myelopathy or radiculopathy, lumbosacral region: Secondary | ICD-10-CM

## 2024-03-21 ENCOUNTER — Encounter: Admitting: Internal Medicine

## 2024-03-21 DIAGNOSIS — K080 Exfoliation of teeth due to systemic causes: Secondary | ICD-10-CM | POA: Diagnosis not present

## 2024-03-28 ENCOUNTER — Ambulatory Visit: Attending: Family Medicine | Admitting: Physical Therapy

## 2024-03-28 ENCOUNTER — Encounter: Payer: Self-pay | Admitting: Physical Therapy

## 2024-03-28 DIAGNOSIS — H5213 Myopia, bilateral: Secondary | ICD-10-CM | POA: Diagnosis not present

## 2024-03-28 DIAGNOSIS — H524 Presbyopia: Secondary | ICD-10-CM | POA: Diagnosis not present

## 2024-03-28 DIAGNOSIS — H16223 Keratoconjunctivitis sicca, not specified as Sjogren's, bilateral: Secondary | ICD-10-CM | POA: Diagnosis not present

## 2024-03-28 DIAGNOSIS — M542 Cervicalgia: Secondary | ICD-10-CM | POA: Insufficient documentation

## 2024-03-28 DIAGNOSIS — M47817 Spondylosis without myelopathy or radiculopathy, lumbosacral region: Secondary | ICD-10-CM | POA: Diagnosis not present

## 2024-03-28 DIAGNOSIS — M5459 Other low back pain: Secondary | ICD-10-CM | POA: Insufficient documentation

## 2024-03-28 DIAGNOSIS — R42 Dizziness and giddiness: Secondary | ICD-10-CM | POA: Diagnosis not present

## 2024-03-28 NOTE — Therapy (Unsigned)
 OUTPATIENT PHYSICAL THERAPY THORACOLUMBAR EVALUATION   Patient Name: Brittany Baldwin MRN: [id number] DOB:[birth year], 53 y.o., female Today's Date: 03/29/2024  END OF SESSION:  PT End of Session - 03/29/24 0749     Visit Number 1    Number of Visits 17    Date for Recertification  05/24/24    Authorization Type [id number]    PT Start Time [phone]    PT Stop Time 0820    PT Time Calculation (min) 42 min    Activity Tolerance Patient tolerated treatment well    Behavior During Therapy [facility] for tasks assessed/performed          Past Medical History:  Diagnosis Date   Anemia    H/O    Bilateral temporomandibular joint pain 09/24/2021   COPD (chronic obstructive pulmonary disease) (HCC)    Dependent edema 08/05/2016   Dysrhythmia    SVT-HAD CARDIAC ABLATION DONE IN 2016 BUT PT STATES SHE STILL GETS INTERMITTENT TACHYCARDIC AND IS SYPMPTOMATIC WITH SOB, DIZZINESS DURING TACHY EPISODES (06-25-15)   Elevated parathyroid hormone 07/03/2016   Fibroid    GERD (gastroesophageal reflux disease)    Headache    H/O   Heart murmur    as child   History of fundoplication 12/14/2014   History of pulmonary embolus during pregnancy 05/16/2015   2001   Hoarseness, persistent 10/06/2017   Hypercalcemia 01/18/2017   PTH, PTHrP, 1,25 vit D and 25 vit D, Thyroid  and Serum free light chains - normal  Repeat Calcium  WNL   Hyperlipemia    Motion sickness    cars, boats   PE (pulmonary thromboembolism) (HCC) 2000   while on bed rest during pregnancy   Plantar fasciitis, bilateral 08/05/2016   PONV (postoperative nausea and vomiting)    Shortness of breath dyspnea    ASSOCIATED WITH TACHYCARDIA   Status post abdominal hysterectomy 07/02/2015   Status post TAH, bilateral salpingectomy. PATHOLOGY: Uterine fibroids and adenomyosis    SVT (supraventricular tachycardia)    Tachycardia    Past Surgical History:  Procedure Laterality Date   ABDOMINAL HYSTERECTOMY Bilateral 07/02/2015   Procedure:  HYSTERECTOMY ABDOMINAL WITH BS;  Surgeon: Gladis DELENA Dollar, MD;  Location: [facility];  Service: Gynecology;  Laterality: Bilateral;   APPENDECTOMY     BIOPSY N/A 05/24/2020   Procedure: BIOPSY;  Surgeon: Unk Corinn Skiff, MD;  Location: [facility];  Service: Endoscopy;  Laterality: N/A;   CARDIAC ELECTROPHYSIOLOGY STUDY AND ABLATION     CHOLECYSTECTOMY     COLONOSCOPY  2013   COLONOSCOPY WITH PROPOFOL  N/A 03/10/2017   Procedure: COLONOSCOPY WITH PROPOFOL ;  Surgeon: Unk Corinn Skiff, MD;  Location: [facility];  Service: Endoscopy;  Laterality: N/A;   CYSTOSCOPY Bilateral 07/02/2015   Procedure: CYSTOSCOPY;  Surgeon: Gladis DELENA Dollar, MD;  Location: [facility];  Service: Gynecology;  Laterality: Bilateral;   ESOPHAGEAL DILATION  03/10/2017   Procedure: ESOPHAGEAL DILATION;  Surgeon: Unk Corinn Skiff, MD;  Location: [facility];  Service: Endoscopy;;   ESOPHAGEAL MANOMETRY N/A 07/24/2021   Procedure: ESOPHAGEAL MANOMETRY (EM);  Surgeon: Shila Gustav GAILS, MD;  Location: [facility];  Service: Endoscopy;  Laterality: N/A;   ESOPHAGOGASTRODUODENOSCOPY N/A 03/10/2017   Procedure: ESOPHAGOGASTRODUODENOSCOPY (EGD);  Surgeon: Unk Corinn Skiff, MD;  Location: [facility];  Service: Endoscopy;  Laterality: N/A;   ESOPHAGOGASTRODUODENOSCOPY (EGD) WITH PROPOFOL  N/A 07/01/2019   Procedure: ESOPHAGOGASTRODUODENOSCOPY (EGD) WITH PROPOFOL ;  Surgeon: Unk Corinn Skiff, MD;  Location: [facility];  Service: Endoscopy;  Laterality: N/A;  ESOPHAGOGASTRODUODENOSCOPY (EGD) WITH PROPOFOL  N/A 05/24/2020   Procedure: ESOPHAGOGASTRODUODENOSCOPY (EGD) WITH PROPOFOL ;  Surgeon: Unk Corinn Skiff, MD;  Location: [facility];  Service: Endoscopy;  Laterality: N/A;   ESOPHAGOGASTRODUODENOSCOPY (EGD) WITH PROPOFOL  N/A 06/30/2023   Procedure: ESOPHAGOGASTRODUODENOSCOPY (EGD) WITH PROPOFOL  WITH BIOPSY;  Surgeon: Unk Corinn Skiff, MD;  Location: [facility];  Service: Endoscopy;  Laterality: N/A;   LAPAROTOMY N/A 07/11/2015   Procedure: hematoma evacuation;  Surgeon: Gladis DELENA Dollar, MD;  Location: [facility];  Service: Gynecology;  Laterality: N/A;   NISSEN FUNDOPLICATION     TUBAL LIGATION     Patient Active Problem List   Diagnosis Date Noted   Chronic musculoskeletal pain 02/17/2024   Cervicalgia 02/16/2024   Vitamin D  deficiency 10/06/2023   Prediabetes 12/30/2022   Mild intermittent asthma without complication 12/30/2022   Environmental and seasonal allergies 04/10/2022   Greater trochanteric pain syndrome of right lower extremity 07/25/2021   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   Spondylosis of 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 06/14/2021   Osteophyte of left hip 04/19/2020   Acanthosis nigricans 07/30/2018   Hypercalcemia 01/18/2017   Constipation by delayed colonic transit 08/05/2016   Vocal cord nodules 02/13/2016   S/P total hysterectomy 07/02/2015   Tachycardia 05/10/2015   Gastroesophageal reflux disease 03/28/2014   Hyperlipidemia, mixed 03/28/2014    PCP: Justus Leita DEL, MD  REFERRING PROVIDER: Alvia Selinda PARAS, MD  REFERRING DIAG:  M54.2 (ICD-10-CM) - Cervicalgia   M47.817 (ICD-10-CM) - Spondylosis of 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  RATIONALE FOR EVALUATION AND TREATMENT: Rehabilitation  THERAPY DIAG: No diagnosis found.  ONSET DATE: Past 6 months   FOLLOW-UP APPT SCHEDULED WITH REFERRING PROVIDER: Yes ; f/u with Dr. Alvia 04/25/24   SUBJECTIVE:                                                                                                                                                                                         SUBJECTIVE STATEMENT:  Pt is a 53 year old female with referral for cervicalgia and low back pain with referring diagnosis of lumbosacral spondylosis.   PERTINENT HISTORY: Pt is a 53 year old female with  referral for cervicalgia and low back pain with referring diagnosis of lumbosacral spondylosis.   Patient reports chronic back pain. She reports neck pain that travels into head/shoulders. Pt reports back pain that can travel as far as R>L hip. She reports more R-sided lower quarter pain.   Pt feels that prolonged sitting at work has made her condition worse. Pt has to break up sitting time and tries to walk into another room as able. She reports  sensation of spasm that requires her to lie down and let it pass. Pt works remote and does not take hour-long commute that she used to take. Atraumatic onset.   PAIN:    Pain Intensity: Present: 7/10, Best: 5/10, Worst: 9/10 Pain location: Low back, cervical paraspinal and occipital region  Pain Quality: ache; low back pain Radiating: Yes ; from back to hip; neck to shoulders  Numbness/Tingling: Yes; seldom gets paresthesias in paracervical region/axial region Focal Weakness: No Aggravating factors: turning head, looking up (head/neck); bending over, riding in car (especially prolonged), lying on stomach hurts low back Relieving factors: rubbing neck and shoulders 24-hour pain behavior: worse later in day  History of prior back injury, pain, surgery, or therapy: Yes; remote hx of 2 PT visits in 2023 for back pain; no surgeries Dominant hand: right Imaging: Yes     EXAM: CERVICAL SPINE - COMPLETE 4+ VIEW   COMPARISON:  None Available.   FINDINGS: There is no evidence of cervical spine fracture or prevertebral soft tissue swelling. Straightening of the cervical lordosis. No listhesis. Disc heights are preserved. No significant facet arthropathy.   IMPRESSION: Straightening of the cervical lordosis. No significant degenerative changes.  ________________  CLINICAL DATA:  Lumbosacral local pain. Acute on chronic history.   EXAM: LUMBAR SPINE - COMPLETE 4+ VIEW   COMPARISON:  CT of the abdomen and pelvis 03/20/2015   FINDINGS: Five  non rib-bearing lumbar type vertebral bodies are present. Slight retrolisthesis is again noted at L2-3. No other significant listhesis is present. Lumbar lordosis preserved. Mild facet hypertrophy at L5-S1 is worse right than left. Disc heights are maintained. Disc protrusions suspected at L4-5 and L5-S1.   IMPRESSION: 1. Stable slight retrolisthesis at L2-3. 2. Suspect disc protrusions at L4-5 and L5-S1. MRI of lumbar spine would be useful for further evaluation as clinically indicated.    Red flags: Negative for bowel/bladder changes, saddle paresthesia, personal history of cancer, h/o spinal tumors, h/o compression fx, h/o abdominal aneurysm, abdominal pain, chills/fever, night sweats, nausea, vomiting, unrelenting pain, first onset of insidious LBP <20 y/o   PRECAUTIONS: None  WEIGHT BEARING RESTRICTIONS: No  FALLS: Has patient fallen in last 6 months? 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 Personal care (washing, dressing, etc.) 1 =  I can look after myself normally but it causes extra pain  3. Lifting 1 =  I can lift heavy weights but it gives extra pain  4. Reading 2 =  I can read as much as I want with moderate pain in my neck  5. Headaches 1 =  I have slight headaches, which come infrequently  6. Concentration 1 =  I can concentrate fully when I want to with slight difficulty   7. Work 1 =  I can only do my usual work, but no more  8. Driving 3 = I can't drive my car as long as I  want because of moderate pain in my neck  9. Sleeping 3 =  My sleep is moderately disturbed (2-3 hrs sleepless)  10. Recreation 3 = I am able to engage in a few of my usual recreation activities because of pain in   my neck  Total 18/50    Cognition Patient is oriented to person, place, and time.  Recent memory is intact.  Remote memory is intact.  Attention span and concentration are intact.  Expressive speech is intact.  Patient's fund of knowledge is within normal limits for educational level.    Gross Musculoskeletal Assessment Tremor: None Bulk: Normal Tone: Normal No visible step-off along spinal column, no signs of scoliosis Sit to stand: difficulty with moving from trunk flexion to neutral extension with UE assist on anterior thighs, notable pain behaviors related to chronic back pain  GAIT: Distance walked: 40 ft Assistive device utilized: None Level of assistance: Complete Independence Comments: Guarded posture, dec trunk rotation, dec gait velocity and stride length  Posture: Lumbar lordosis: Decreased  FHRS  Lumbar lateral shift: Negative  AROM Cervical flexion:  45 (tense posterior C-spine/periscaular) Cervical extension: 45  Lateral flexion: Right 37* , Left 35* Cervical rotation: Right 65, Left 60* *Indicates pain  AROM (Normal range in degrees) AROM  03/28/24  Lumbar   Flexion (65) 75%* (axial low back pain)  Extension (30) 50%*  Right lateral flexion (25) 75%* (low back, axial)  Left lateral flexion (25) 75%* (mild low back)  Right rotation (30) 50%*  Left rotation (30) 75%*      Hip Right Left  Flexion (125) WNL WNL  Extension (15)    Abduction (40)    Adduction     Internal Rotation (45)    External Rotation (45)        (* = pain; Blank rows = not tested)    MMT: UE MMT Shoulder flexion: 5/5 bilat Abduction: 5/5 bilat  Biceps: 5/5 bilat Triceps: 5/5 bilat Wrist extension: 5/5 bilat   LE MMT (out of 5) Right 03/28/24  Left 03/28/24  Hip flexion 5 5  Hip extension    Hip abduction    Hip adduction    Hip internal rotation    Hip external rotation    Knee flexion 5 5  Knee extension 5 5  Ankle dorsiflexion 5 5  Ankle plantarflexion    Ankle inversion    Ankle eversion    (* = pain; Blank rows = not tested)  Sensation Deferred  Reflexes Deferred  Muscle Length Hamstrings: R: Negative L: Negative Ely (quadriceps): R: Not examined L: Not examined   Palpation Location Right Left         Lumbar paraspinals 1 1  Quadratus Lumborum    Gluteus Maximus 1 1  Gluteus Medius 1 1  Deep hip external rotators 1 1  PSIS 1 1  Fortin's Area (SIJ)    Greater Trochanter 1 1  (Blank rows = not tested) Graded on 0-4 scale (0 = no pain, 1 = pain, 2 = pain with wincing/grimacing/flinching, 3 = pain with withdrawal, 4 = unwilling to allow palpation)  Passive Accessory  Motion Deferred  Special Tests Cervical Radiculopathy: Cervical compression/distraction: Positive Spurling's: Negative for radicular pain, localized neck pain for R and L  Lumbar Radiculopathy and Discogenic: Centralization and Peripheralization (SN 92, -LR 0.12): Not examined Slump (SN 83, -LR 0.32): R: Negative L: Negative SLR (SN 92, -LR 0.29): R: Negative L:  Negative  Lumbar Foraminal Stenosis: Lumbar quadrant (SN 70): R: Positive L: Positive     TODAY'S TREATMENT: DATE: 03/28/24  Self-care/Home Management - discussion on appropriate ergonomics/activity modification, PT education   Patient education on current condition, anatomy involved, prognosis, plan of care. Discussion on activity modification to prevent flare-up of condition, including postural correction and proper desk setup as well as breaking up sitting time as able during workday every 0.5-1 hr. Reviewed self-traction techniques for cervical and lumbar spine.     PATIENT EDUCATION:  Education details: see above for patient education details Person  educated: Patient Education method: Explanation, Demonstration, and Handouts Education comprehension: verbalized understanding and returned demonstration   HOME EXERCISE PROGRAM:  Formal HEP to be given on visit # 2   ASSESSMENT:  CLINICAL IMPRESSION: Patient is a 53 y.o. female who was seen today for physical therapy evaluation and treatment for pain in neck with referral to periscapular/shoulder region and back pain with referral to R>L hip with acute on chronic flare-up. Pt had remote Hx of 2 PT visits in 2023 related to low back pain/R lower quarter pain. Pt has negative testing for lower limb tension tests and no paresthesias/UE referred symptoms with Spurling's test (only localized paracervical pain). Pt seems to be aggravated primarily by flexion-based postures/positions, but she has notable pain with prone lying and extension also. We will need to further evaluate repeated movements next visit for symptomatic and baseline response; we will also check cervicothoracic and thoracolumbar passive accessories next visit. Formal HEP will be established once these assessments are completed. Pt has current deficits in thoracolumbar AROM, pain with accessing C-spine AROM, postural changes, TTP/sensitivity along R>L lumbar paraspinals and gluteal musculature. Pt will continue to benefit from skilled PT services to address deficits and improve function.   OBJECTIVE IMPAIRMENTS: Abnormal gait, difficulty walking, decreased ROM, hypomobility, increased muscle spasms, postural dysfunction, and pain.   ACTIVITY LIMITATIONS: carrying, lifting, bending, sitting, sleeping, transfers, bed mobility, and reach over head  PARTICIPATION LIMITATIONS: meal prep, cleaning, driving, community activity, and occupation  PERSONAL FACTORS: Past/current experiences, Time since onset of injury/illness/exacerbation, and 3+ comorbidities: (chronic pain, HLD, prediabetes, lumbar spondylosis) are also affecting patient's  functional outcome.   REHAB POTENTIAL: Good  CLINICAL DECISION MAKING: Evolving/moderate complexity  EVALUATION COMPLEXITY: Moderate   GOALS: Goals reviewed with patient? Yes  SHORT TERM GOALS: Target date: 04/19/2024  Pt will be independent with HEP in order to improve strength and decrease back pain to improve pain-free function at home and work. Baseline: 03/28/24: Cervical/lumbar self-traction and activity modification strategies reviewed; formal HEP to be given on visit #2.  Goal status: INITIAL   LONG TERM GOALS: Target date: 05/24/2024  Patient will have full thoracolumbar AROM without reproduction of pain as needed for reaching items on ground, household chores, bending. Baseline: 03/28/24: Pain and motion loss in all directions (see chart above).  Goal status: INITIAL  2.  Pt will decrease worst neck/back pain by at least 2 points on the NPRS in order to demonstrate clinically significant reduction in back pain. Baseline: 03/28/24: 9/10 at worst.  Goal status: INITIAL  3.  Pt will decrease NDI score by at least 5 points/10% or greater indicative of clinically significant reduction in back pain/disability.       Baseline: 03/28/24: 18/50 Goal status: INITIAL  4.  Patient will have no increase in pain with accessing functional cervical spine AROM in all planes without reproduction of pain as needed for bending, scanning environment, and completing desk work. Baseline: 03/28/24: Pain with flexion, bilat lateral flexion, and L>R rotation with minimal motion loss. Goal status: INITIAL  5.  Patient will tolerate sitting up to half of her work shift prior to lunch break without reproduction of back/neck pain as needed for completion of computer/administrative duties.  Baseline: 03/28/24: Notable pain with prolonged sitting. Goal status: INITIAL   PLAN: PT FREQUENCY: 1-2x/week  PT DURATION: 8 weeks  PLANNED INTERVENTIONS: Therapeutic exercises, Therapeutic activity,  Neuromuscular re-education, Balance training, Gait training, Patient/Family education, Self Care, Joint mobilization, Joint manipulation, Vestibular training, Canalith repositioning, Orthotic/Fit training, DME instructions, Dry Needling, Electrical stimulation, Spinal manipulation, Spinal mobilization, Cryotherapy, Moist heat, Taping, Traction, Ultrasound, Ionotophoresis 4mg /ml Dexamethasone , Manual therapy, and Re-evaluation.  PLAN FOR NEXT SESSION: Complete repeated movement testing for lumbar and cervical spine. Update HEP for specific impairments noted in IE/first follow-up. Manual techniques/STM for cervical spine and lumbar spine. Graded movement as tolerated.    Venetia Endo, PT, DPT #[id number]  Venetia ONEIDA Endo, PT 03/29/2024, 7:50 AM

## 2024-04-04 ENCOUNTER — Ambulatory Visit: Attending: Family Medicine | Admitting: Physical Therapy

## 2024-04-04 DIAGNOSIS — M5459 Other low back pain: Secondary | ICD-10-CM | POA: Insufficient documentation

## 2024-04-04 DIAGNOSIS — M542 Cervicalgia: Secondary | ICD-10-CM | POA: Diagnosis not present

## 2024-04-04 NOTE — Therapy (Unsigned)
 OUTPATIENT PHYSICAL THERAPY TREATMENT   Patient Name: Brittany Baldwin MRN: [id number] DOB:May 21, 1971, 53 y.o., female Today's Date: 04/04/2024  END OF SESSION:  PT End of Session - 04/06/24 1041     Visit Number 2    Number of Visits 17    Date for Recertification  05/24/24    Authorization Type [id number]    PT Start Time 1506    PT Stop Time 1553    PT Time Calculation (min) 47 min    Activity Tolerance Patient tolerated treatment well    Behavior During Therapy [facility] for tasks assessed/performed            Past Medical History:  Diagnosis Date   Anemia    H/O    Bilateral temporomandibular joint pain 09/24/2021   COPD (chronic obstructive pulmonary disease) (HCC)    Dependent edema 08/05/2016   Dysrhythmia    SVT-HAD CARDIAC ABLATION DONE IN 2016 BUT PT STATES SHE STILL GETS INTERMITTENT TACHYCARDIC AND IS SYPMPTOMATIC WITH SOB, DIZZINESS DURING TACHY EPISODES (06-25-15)   Elevated parathyroid hormone 07/03/2016   Fibroid    GERD (gastroesophageal reflux disease)    Headache    H/O   Heart murmur    as child   History of fundoplication 12/14/2014   History of pulmonary embolus during pregnancy 05/16/2015   2001   Hoarseness, persistent 10/06/2017   Hypercalcemia 01/18/2017   PTH, PTHrP, 1,25 vit D and 25 vit D, Thyroid  and Serum free light chains - normal  Repeat Calcium  WNL   Hyperlipemia    Motion sickness    cars, boats   PE (pulmonary thromboembolism) (HCC) 2000   while on bed rest during pregnancy   Plantar fasciitis, bilateral 08/05/2016   PONV (postoperative nausea and vomiting)    Shortness of breath dyspnea    ASSOCIATED WITH TACHYCARDIA   Status post abdominal hysterectomy 07/02/2015   Status post TAH, bilateral salpingectomy. PATHOLOGY: Uterine fibroids and adenomyosis    SVT (supraventricular tachycardia)    Tachycardia    Past Surgical History:  Procedure Laterality Date   ABDOMINAL HYSTERECTOMY Bilateral 07/02/2015   Procedure: HYSTERECTOMY  ABDOMINAL WITH BS;  Surgeon: Gladis DELENA Dollar, MD;  Location: [facility];  Service: Gynecology;  Laterality: Bilateral;   APPENDECTOMY     BIOPSY N/A 05/24/2020   Procedure: BIOPSY;  Surgeon: Unk Corinn Skiff, MD;  Location: [facility];  Service: Endoscopy;  Laterality: N/A;   CARDIAC ELECTROPHYSIOLOGY STUDY AND ABLATION     CHOLECYSTECTOMY     COLONOSCOPY  2013   COLONOSCOPY WITH PROPOFOL  N/A 03/10/2017   Procedure: COLONOSCOPY WITH PROPOFOL ;  Surgeon: Unk Corinn Skiff, MD;  Location: [facility];  Service: Endoscopy;  Laterality: N/A;   CYSTOSCOPY Bilateral 07/02/2015   Procedure: CYSTOSCOPY;  Surgeon: Gladis DELENA Dollar, MD;  Location: [facility];  Service: Gynecology;  Laterality: Bilateral;   ESOPHAGEAL DILATION  03/10/2017   Procedure: ESOPHAGEAL DILATION;  Surgeon: Unk Corinn Skiff, MD;  Location: [facility];  Service: Endoscopy;;   ESOPHAGEAL MANOMETRY N/A 07/24/2021   Procedure: ESOPHAGEAL MANOMETRY (EM);  Surgeon: Shila Gustav GAILS, MD;  Location: [facility];  Service: Endoscopy;  Laterality: N/A;   ESOPHAGOGASTRODUODENOSCOPY N/A 03/10/2017   Procedure: ESOPHAGOGASTRODUODENOSCOPY (EGD);  Surgeon: Unk Corinn Skiff, MD;  Location: [facility];  Service: Endoscopy;  Laterality: N/A;   ESOPHAGOGASTRODUODENOSCOPY (EGD) WITH PROPOFOL  N/A 07/01/2019   Procedure: ESOPHAGOGASTRODUODENOSCOPY (EGD) WITH PROPOFOL ;  Surgeon: Unk Corinn Skiff, MD;  Location: [facility];  Service: Endoscopy;  Laterality:  N/A;   ESOPHAGOGASTRODUODENOSCOPY (EGD) WITH PROPOFOL  N/A 05/24/2020   Procedure: ESOPHAGOGASTRODUODENOSCOPY (EGD) WITH PROPOFOL ;  Surgeon: Unk Corinn Skiff, MD;  Location: [facility];  Service: Endoscopy;  Laterality: N/A;   ESOPHAGOGASTRODUODENOSCOPY (EGD) WITH PROPOFOL  N/A 06/30/2023   Procedure: ESOPHAGOGASTRODUODENOSCOPY (EGD) WITH PROPOFOL  WITH BIOPSY;  Surgeon: Unk Corinn Skiff, MD;  Location: [facility];   Service: Endoscopy;  Laterality: N/A;   LAPAROTOMY N/A 07/11/2015   Procedure: hematoma evacuation;  Surgeon: Gladis DELENA Dollar, MD;  Location: [facility];  Service: Gynecology;  Laterality: N/A;   NISSEN FUNDOPLICATION     TUBAL LIGATION     Patient Active Problem List   Diagnosis Date Noted   Chronic musculoskeletal pain 02/17/2024   Cervicalgia 02/16/2024   Vitamin D  deficiency 10/06/2023   Prediabetes 12/30/2022   Mild intermittent asthma without complication 12/30/2022   Environmental and seasonal allergies 04/10/2022   Greater trochanteric pain syndrome of right lower extremity 07/25/2021   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   Spondylosis of 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 06/14/2021   Osteophyte of left hip 04/19/2020   Acanthosis nigricans 07/30/2018   Hypercalcemia 01/18/2017   Constipation by delayed colonic transit 08/05/2016   Vocal cord nodules 02/13/2016   S/P total hysterectomy 07/02/2015   Tachycardia 05/10/2015   Gastroesophageal reflux disease 03/28/2014   Hyperlipidemia, mixed 03/28/2014    PCP: Justus Leita DEL, MD  REFERRING PROVIDER: Alvia Selinda PARAS, MD  REFERRING DIAG:  M54.2 (ICD-10-CM) - Cervicalgia   M47.817 (ICD-10-CM) - Spondylosis of 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  RATIONALE FOR EVALUATION AND TREATMENT: Rehabilitation  THERAPY DIAG: Cervicalgia  Other low back pain  ONSET DATE: Past 6 months   FOLLOW-UP APPT SCHEDULED WITH REFERRING PROVIDER: Yes ; f/u with Dr. Alvia 04/25/24  PERTINENT HISTORY: Pt is a 53 year old female with referral for cervicalgia and low back pain with referring diagnosis of lumbosacral spondylosis.   Patient reports chronic back pain. She reports neck pain that travels into head/shoulders. Pt reports back pain that can travel as far as R>L hip. She reports more R-sided lower quarter pain.   Pt feels that prolonged sitting at work has made her  condition worse. Pt has to break up sitting time and tries to walk into another room as able. She reports sensation of spasm that requires her to lie down and let it pass. Pt works remote and does not take hour-long commute that she used to take. Atraumatic onset.   PAIN:    Pain Intensity: Present: 7/10, Best: 5/10, Worst: 9/10 Pain location: Low back, cervical paraspinal and occipital region  Pain Quality: ache; low back pain Radiating: Yes ; from back to hip; neck to shoulders  Numbness/Tingling: Yes; seldom gets paresthesias in paracervical region/axial region Focal Weakness: No Aggravating factors: turning head, looking up (head/neck); bending over, riding in car (especially prolonged), lying on stomach hurts low back Relieving factors: rubbing neck and shoulders 24-hour pain behavior: worse later in day  History of prior back injury, pain, surgery, or therapy: Yes; remote hx of 2 PT visits in 2023 for back pain; no surgeries Dominant hand: right Imaging: Yes     EXAM: CERVICAL SPINE - COMPLETE 4+ VIEW   COMPARISON:  None Available.   FINDINGS: There is no evidence of cervical spine fracture or prevertebral soft tissue swelling. Straightening of the cervical lordosis. No listhesis. Disc heights are preserved. No significant facet arthropathy.   IMPRESSION: Straightening of the cervical lordosis. No significant degenerative changes.  ________________  CLINICAL DATA:  Lumbosacral local pain. Acute on chronic history.   EXAM: LUMBAR SPINE - COMPLETE 4+ VIEW   COMPARISON:  CT of the abdomen and pelvis 03/20/2015   FINDINGS: Five non rib-bearing lumbar type vertebral bodies are present. Slight retrolisthesis is again noted at L2-3. No other significant listhesis is present. Lumbar lordosis preserved. Mild facet hypertrophy at L5-S1 is worse right than left. Disc heights are maintained. Disc protrusions suspected at L4-5 and L5-S1.   IMPRESSION: 1. Stable slight  retrolisthesis at L2-3. 2. Suspect disc protrusions at L4-5 and L5-S1. MRI of lumbar spine would be useful for further evaluation as clinically indicated.    Red flags: Negative for bowel/bladder changes, saddle paresthesia, personal history of cancer, h/o spinal tumors, h/o compression fx, h/o abdominal aneurysm, abdominal pain, chills/fever, night sweats, nausea, vomiting, unrelenting pain, first onset of insidious LBP <20 y/o   PRECAUTIONS: None  WEIGHT BEARING RESTRICTIONS: No  FALLS: Has patient fallen in last 6 months? No  Living Environment Lives with: lives with their spouse and lives with their daughter Lives in: House/apartment Stairs: No steps to enter; stairs to access 2nd level  Has following equipment at home: None  Prior level of function: Independent  Occupational demands: Desk work - works for [employer]: Walking regimen; play volleyball; reading; singing   Patient Goals: Less pain    OBJECTIVE (data from initial evaluation unless otherwise dated):   Patient Surveys  NDI:  NECK DISABILITY INDEX  Date: 03/28/24 Score  Pain intensity 2 = The pain is moderate at the moment  2. Personal care (washing, dressing, etc.) 1 =  I can look after myself normally but it causes extra pain  3. Lifting 1 =  I can lift heavy weights but it gives extra pain  4. Reading 2 =  I can read as much as I want with moderate pain in my neck  5. Headaches 1 =  I have slight headaches, which come infrequently  6. Concentration 1 =  I can concentrate fully when I want to with slight difficulty   7. Work 1 =  I can only do my usual work, but no more  8. Driving 3 = I can't drive my car as long as I want because of moderate pain in my neck  9. Sleeping 3 =  My sleep is moderately disturbed (2-3 hrs sleepless)  10. Recreation 3 = I am able to engage in a few of my usual recreation activities because of pain in   my neck  Total 18/50    Cognition Patient is oriented to person,  place, and time.  Recent memory is intact.  Remote memory is intact.  Attention span and concentration are intact.  Expressive speech is intact.  Patient's fund of knowledge is within normal limits for educational level.    Gross Musculoskeletal Assessment Tremor: None Bulk: Normal Tone: Normal No visible step-off along spinal column, no signs of scoliosis Sit to stand: difficulty with moving from trunk flexion to neutral extension with UE assist on anterior thighs, notable pain behaviors related to chronic back pain  GAIT: Distance walked: 40 ft Assistive device utilized: None Level of assistance: Complete Independence Comments: Guarded posture, dec trunk rotation, dec gait velocity and stride length  Posture: Lumbar lordosis: Decreased  FHRS  Lumbar lateral shift: Negative  AROM Cervical flexion:  45 (tense posterior C-spine/periscaular) Cervical extension: 45  Lateral flexion: Right 37* , Left 35* Cervical rotation: Right 65, Left  60* *Indicates pain  AROM (Normal range in degrees) AROM  03/28/24  Lumbar   Flexion (65) 75%* (axial low back pain)  Extension (30) 50%*  Right lateral flexion (25) 75%* (low back, axial)  Left lateral flexion (25) 75%* (mild low back)  Right rotation (30) 50%*  Left rotation (30) 75%*      Hip Right Left  Flexion (125) WNL WNL  Extension (15)    Abduction (40)    Adduction     Internal Rotation (45)    External Rotation (45)        (* = pain; Blank rows = not tested)    MMT: UE MMT Shoulder flexion: 5/5 bilat Abduction: 5/5 bilat  Biceps: 5/5 bilat Triceps: 5/5 bilat Wrist extension: 5/5 bilat   LE MMT (out of 5) Right 03/28/24 Left 03/28/24  Hip flexion 5 5  Hip extension    Hip abduction    Hip adduction    Hip internal rotation    Hip external rotation    Knee flexion 5 5  Knee extension 5 5  Ankle dorsiflexion 5 5  Ankle plantarflexion    Ankle inversion    Ankle eversion    (* = pain; Blank rows = not  tested)  Sensation Deferred  Reflexes Deferred  Muscle Length Hamstrings: R: Negative L: Negative [place] (quadriceps): R: Not examined L: Not examined   Palpation Location Right Left         Lumbar paraspinals 1 1  Quadratus Lumborum    Gluteus Maximus 1 1  Gluteus Medius 1 1  Deep hip external rotators 1 1  PSIS 1 1  Fortin's Area (SIJ)    Greater Trochanter 1 1  (Blank rows = not tested) Graded on 0-4 scale (0 = no pain, 1 = pain, 2 = pain with wincing/grimacing/flinching, 3 = pain with withdrawal, 4 = unwilling to allow palpation)  Passive Accessory  Motion Pain reproduced at restriction with CPA C3-6. Mild hypomobility C4-7; no notable pain in lower cervical region with CPA.   Special Tests Cervical Radiculopathy: Cervical compression/distraction: Positive Spurling's: Negative for radicular pain, localized neck pain for R and L  Lumbar Radiculopathy and Discogenic: Centralization and Peripheralization (SN 92, -LR 0.12):  Repeated cervical retraction, in supine; 2 x 10,1 sec hold  -mild pull and pressure in CT region and upper back during, no notable increase in pain after  Repeated lumbar extension in lying;  1 x 10  -pain in axial low back and moderate referral toward R flank Repeated lumbar flexion in lying; 1 x 10    -pain in low back and spasm  Slump (SN 83, -LR 0.32): R: Negative L: Negative SLR (SN 92, -LR 0.29): R: Negative L:  Negative  Lumbar Foraminal Stenosis: Lumbar quadrant (SN 70): R: Positive L: Positive     TODAY'S TREATMENT: DATE: 04/04/2024   SUBJECTIVE STATEMENT:   Pt reports notable HA at baseline - pt reports pain affecting left temporal region intermittently. Patient reports pain affecting occipital region and upper cervical region also. Patient reports some benefit transiently with use of traction techniques discussed last visit. Patient reports notable spasms affecting posterior cervical spine and lumbar paraspinal region. She presently  has more pain affecting posterior cervical spine and upper trap region.     Manual Therapy - for symptom modulation, soft tissue sensitivity and mobility, joint mobility, ROM   Manual cervical traction in supine; 10 sec on, 10 sec off; x 5 minutes for nerve root decompression and  symptom modulation  *Passive accessories updated -Pain reproduced at restriction with CPA C3-6. Mild hypomobility C4-7; no notable pain in lower cervical region with CPA.  STM/DTM C3-6 splenius cervicis/capitis and bilateral upper traps; x 10 minutes  __________  Hooklying traction attempted with belt - stopped due to discomfort on calves General manual lumbar traction in supine with Mulligan belt; therapist at foot of patient; 10 sec on, 10 sec off x 5 minutes for nerve root decompression, pain control    Therapeutic Exercise - for improved soft tissue flexibility and extensibility as needed for ROM, repeated movement for symptom modulation and to improve ROM  Repeated motion testing:  Repeated cervical retraction, in supine; 2 x 10,1 sec hold  -mild pull and pressure in CT region and upper back during, no notable increase in pain after  ___________  Repeated lumbar extension in lying;  1 x 10  -pain in axial low back and moderate referral toward R flank Repeated lumbar flexion in lying; 1 x 10  -pain in low back and spasm   Lower trunk rotations, hooklying; 1 x 10 alt R/L  PATIENT EDUCATION: Reviewed baseline home exercises and discussed use of repeated movement program and stretching for C-spine. We elected to hold on L-spine repeated movement HEP due to unfavorable response for both flexion and extension. Alternative treatments discussed, such as paraspinal STM/DTM and potential use of dry needling for persistent pain. MedBridge handout given for carryover of home exercises.   MHP (unbilled) utilized post-treatment in sitting for analgesic effect and improved soft tissue extensibility; hot pack draped  along neck/upper traps and along low back in sitting position; x 5 minutes    PATIENT EDUCATION:  Education details: see above for patient education details Person educated: Patient Education method: Explanation, Demonstration, and Handouts Education comprehension: verbalized understanding and returned demonstration   HOME EXERCISE PROGRAM:  Access Code: [email or web address] URL: [email or web address] Date: 04/04/2024 Prepared by: Venetia Endo  Exercises - Supine Chin Tuck  - 5-6 x daily - 7 x weekly - 1 sets - 10 reps - 1 sec hold - Seated Upper Trapezius Stretch  - 2 x daily - 7 x weekly - 3 sets - 30sec hold - Seated Scapular Retraction  - 2 x daily - 7 x weekly - 2 sets - 10 reps - 3sec hold - Supine Lower Trunk Rotation  - 2 x daily - 7 x weekly - 2 sets - 10 reps - 2sec hold   ASSESSMENT:  CLINICAL IMPRESSION: Patient has notable upper quarter pain at baseline today with good response to traction and C-spine paraspinal STM. We initiated repeated movement for cervical spine with no notable worsening of symptoms with repeated retraction and mild pain at rest in supine lying following repeated movement; we will continue f/u on response with repeated movement and stretching program at home. Pt has unfavorable response for both repeated flexion and extension for lumbar spine. We initiated simple LTR and instructed pt to continue with activity modification and self-traction techniques prn. We can continue with STM/DTM and consideration of dry needling for low back next visit; we will also work on addressing thoracolumbar mobility deficits and further update thoracic/lumbar passive accessories next visit. Pt has current deficits in thoracolumbar AROM, pain with accessing C-spine AROM, postural changes, TTP/sensitivity along R>L lumbar paraspinals and gluteal musculature. Pt will continue to benefit from skilled PT services to address deficits and improve function.   OBJECTIVE  IMPAIRMENTS: Abnormal gait, difficulty walking, decreased ROM, hypomobility, increased muscle spasms, postural dysfunction,  and pain.   ACTIVITY LIMITATIONS: carrying, lifting, bending, sitting, sleeping, transfers, bed mobility, and reach over head  PARTICIPATION LIMITATIONS: meal prep, cleaning, driving, community activity, and occupation  PERSONAL FACTORS: Past/current experiences, Time since onset of injury/illness/exacerbation, and 3+ comorbidities: (chronic pain, HLD, prediabetes, lumbar spondylosis) are also affecting patient's functional outcome.   REHAB POTENTIAL: Good  CLINICAL DECISION MAKING: Evolving/moderate complexity  EVALUATION COMPLEXITY: Moderate   GOALS: Goals reviewed with patient? Yes  SHORT TERM GOALS: Target date: 04/19/2024  Pt will be independent with HEP in order to improve strength and decrease back pain to improve pain-free function at home and work. Baseline: 03/28/24: Cervical/lumbar self-traction and activity modification strategies reviewed; formal HEP to be given on visit #2.  Goal status: INITIAL   LONG TERM GOALS: Target date: 05/24/2024  Patient will have full thoracolumbar AROM without reproduction of pain as needed for reaching items on ground, household chores, bending. Baseline: 03/28/24: Pain and motion loss in all directions (see chart above).  Goal status: INITIAL  2.  Pt will decrease worst neck/back pain by at least 2 points on the NPRS in order to demonstrate clinically significant reduction in back pain. Baseline: 03/28/24: 9/10 at worst.  Goal status: INITIAL  3.  Pt will decrease NDI score by at least 5 points/10% or greater indicative of clinically significant reduction in back pain/disability.       Baseline: 03/28/24: 18/50 Goal status: INITIAL  4.  Patient will have no increase in pain with accessing functional cervical spine AROM in all planes without reproduction of pain as needed for bending, scanning environment, and  completing desk work. Baseline: 03/28/24: Pain with flexion, bilat lateral flexion, and L>R rotation with minimal motion loss. Goal status: INITIAL  5.  Patient will tolerate sitting up to half of her work shift prior to lunch break without reproduction of back/neck pain as needed for completion of computer/administrative duties.  Baseline: 03/28/24: Notable pain with prolonged sitting. Goal status: INITIAL   PLAN: PT FREQUENCY: 1-2x/week  PT DURATION: 8 weeks  PLANNED INTERVENTIONS: Therapeutic exercises, Therapeutic activity, Neuromuscular re-education, Balance training, Gait training, Patient/Family education, Self Care, Joint mobilization, Joint manipulation, Vestibular training, Canalith repositioning, Orthotic/Fit training, DME instructions, Dry Needling, Electrical stimulation, Spinal manipulation, Spinal mobilization, Cryotherapy, Moist heat, Taping, Traction, Ultrasound, Ionotophoresis 4mg /ml Dexamethasone , Manual therapy, and Re-evaluation.  PLAN FOR NEXT SESSION: Follow-up on HEP/repeated movement for cervical spine. Manual techniques/STM for cervical spine and lumbar spine. Graded movement as tolerated.  Further update thoracic/lumbar passive accessories next visit.   Venetia Endo, PT, DPT #[id number]  Venetia ONEIDA Endo, PT 04/06/2024, 10:41 AM

## 2024-04-06 ENCOUNTER — Encounter: Payer: Self-pay | Admitting: Physical Therapy

## 2024-04-11 ENCOUNTER — Ambulatory Visit: Admitting: Physical Therapy

## 2024-04-11 DIAGNOSIS — M5459 Other low back pain: Secondary | ICD-10-CM

## 2024-04-11 DIAGNOSIS — M542 Cervicalgia: Secondary | ICD-10-CM

## 2024-04-11 NOTE — Therapy (Incomplete)
 OUTPATIENT PHYSICAL THERAPY TREATMENT   Patient Name: Brittany Baldwin MRN: [id number] DOB:1970/11/16, 53 y.o., female Today's Date: 04/04/2024  END OF SESSION:      Past Medical History:  Diagnosis Date   Anemia    H/O    Bilateral temporomandibular joint pain 09/24/2021   COPD (chronic obstructive pulmonary disease) (HCC)    Dependent edema 08/05/2016   Dysrhythmia    SVT-HAD CARDIAC ABLATION DONE IN 2016 BUT PT STATES SHE STILL GETS INTERMITTENT TACHYCARDIC AND IS SYPMPTOMATIC WITH SOB, DIZZINESS DURING TACHY EPISODES (06-25-15)   Elevated parathyroid hormone 07/03/2016   Fibroid    GERD (gastroesophageal reflux disease)    Headache    H/O   Heart murmur    as child   History of fundoplication 12/14/2014   History of pulmonary embolus during pregnancy 05/16/2015   2001   Hoarseness, persistent 10/06/2017   Hypercalcemia 01/18/2017   PTH, PTHrP, 1,25 vit D and 25 vit D, Thyroid  and Serum free light chains - normal  Repeat Calcium  WNL   Hyperlipemia    Motion sickness    cars, boats   PE (pulmonary thromboembolism) (HCC) 2000   while on bed rest during pregnancy   Plantar fasciitis, bilateral 08/05/2016   PONV (postoperative nausea and vomiting)    Shortness of breath dyspnea    ASSOCIATED WITH TACHYCARDIA   Status post abdominal hysterectomy 07/02/2015   Status post TAH, bilateral salpingectomy. PATHOLOGY: Uterine fibroids and adenomyosis    SVT (supraventricular tachycardia)    Tachycardia    Past Surgical History:  Procedure Laterality Date   ABDOMINAL HYSTERECTOMY Bilateral 07/02/2015   Procedure: HYSTERECTOMY ABDOMINAL WITH BS;  Surgeon: Gladis DELENA Dollar, MD;  Location: [facility];  Service: Gynecology;  Laterality: Bilateral;   APPENDECTOMY     BIOPSY N/A 05/24/2020   Procedure: BIOPSY;  Surgeon: Unk Corinn Skiff, MD;  Location: [facility];  Service: Endoscopy;  Laterality: N/A;   CARDIAC ELECTROPHYSIOLOGY STUDY AND ABLATION     CHOLECYSTECTOMY      COLONOSCOPY  2013   COLONOSCOPY WITH PROPOFOL  N/A 03/10/2017   Procedure: COLONOSCOPY WITH PROPOFOL ;  Surgeon: Unk Corinn Skiff, MD;  Location: [facility];  Service: Endoscopy;  Laterality: N/A;   CYSTOSCOPY Bilateral 07/02/2015   Procedure: CYSTOSCOPY;  Surgeon: Gladis DELENA Dollar, MD;  Location: [facility];  Service: Gynecology;  Laterality: Bilateral;   ESOPHAGEAL DILATION  03/10/2017   Procedure: ESOPHAGEAL DILATION;  Surgeon: Unk Corinn Skiff, MD;  Location: [facility];  Service: Endoscopy;;   ESOPHAGEAL MANOMETRY N/A 07/24/2021   Procedure: ESOPHAGEAL MANOMETRY (EM);  Surgeon: Shila Gustav GAILS, MD;  Location: [facility];  Service: Endoscopy;  Laterality: N/A;   ESOPHAGOGASTRODUODENOSCOPY N/A 03/10/2017   Procedure: ESOPHAGOGASTRODUODENOSCOPY (EGD);  Surgeon: Unk Corinn Skiff, MD;  Location: [facility];  Service: Endoscopy;  Laterality: N/A;   ESOPHAGOGASTRODUODENOSCOPY (EGD) WITH PROPOFOL  N/A 07/01/2019   Procedure: ESOPHAGOGASTRODUODENOSCOPY (EGD) WITH PROPOFOL ;  Surgeon: Unk Corinn Skiff, MD;  Location: [facility];  Service: Endoscopy;  Laterality: N/A;   ESOPHAGOGASTRODUODENOSCOPY (EGD) WITH PROPOFOL  N/A 05/24/2020   Procedure: ESOPHAGOGASTRODUODENOSCOPY (EGD) WITH PROPOFOL ;  Surgeon: Unk Corinn Skiff, MD;  Location: [facility];  Service: Endoscopy;  Laterality: N/A;   ESOPHAGOGASTRODUODENOSCOPY (EGD) WITH PROPOFOL  N/A 06/30/2023   Procedure: ESOPHAGOGASTRODUODENOSCOPY (EGD) WITH PROPOFOL  WITH BIOPSY;  Surgeon: Unk Corinn Skiff, MD;  Location: [facility];  Service: Endoscopy;  Laterality: N/A;   LAPAROTOMY N/A 07/11/2015   Procedure: hematoma evacuation;  Surgeon: Gladis DELENA Dollar, MD;  Location:  [facility];  Service: Gynecology;  Laterality: N/A;   NISSEN FUNDOPLICATION     TUBAL LIGATION     Patient Active Problem List   Diagnosis Date Noted   Chronic musculoskeletal pain 02/17/2024   Cervicalgia 02/16/2024    Vitamin D  deficiency 10/06/2023   Prediabetes 12/30/2022   Mild intermittent asthma without complication 12/30/2022   Environmental and seasonal allergies 04/10/2022   Greater trochanteric pain syndrome of right lower extremity 07/25/2021   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   Spondylosis of 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 06/14/2021   Osteophyte of left hip 04/19/2020   Acanthosis nigricans 07/30/2018   Hypercalcemia 01/18/2017   Constipation by delayed colonic transit 08/05/2016   Vocal cord nodules 02/13/2016   S/P total hysterectomy 07/02/2015   Tachycardia 05/10/2015   Gastroesophageal reflux disease 03/28/2014   Hyperlipidemia, mixed 03/28/2014    PCP: Justus Leita DEL, MD  REFERRING PROVIDER: Alvia Selinda PARAS, MD  REFERRING DIAG:  M54.2 (ICD-10-CM) - Cervicalgia   M47.817 (ICD-10-CM) - Spondylosis of 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  RATIONALE FOR EVALUATION AND TREATMENT: Rehabilitation  THERAPY DIAG: Cervicalgia  Other low back pain  ONSET DATE: Past 6 months   FOLLOW-UP APPT SCHEDULED WITH REFERRING PROVIDER: Yes ; f/u with Dr. Alvia 04/25/24  PERTINENT HISTORY: Pt is a 53 year old female with referral for cervicalgia and low back pain with referring diagnosis of lumbosacral spondylosis.   Patient reports chronic back pain. She reports neck pain that travels into head/shoulders. Pt reports back pain that can travel as far as R>L hip. She reports more R-sided lower quarter pain.   Pt feels that prolonged sitting at work has made her condition worse. Pt has to break up sitting time and tries to walk into another room as able. She reports sensation of spasm that requires her to lie down and let it pass. Pt works remote and does not take hour-long commute that she used to take. Atraumatic onset.   PAIN:    Pain Intensity: Present: 7/10, Best: 5/10, Worst: 9/10 Pain location: Low back, cervical  paraspinal and occipital region  Pain Quality: ache; low back pain Radiating: Yes ; from back to hip; neck to shoulders  Numbness/Tingling: Yes; seldom gets paresthesias in paracervical region/axial region Focal Weakness: No Aggravating factors: turning head, looking up (head/neck); bending over, riding in car (especially prolonged), lying on stomach hurts low back Relieving factors: rubbing neck and shoulders 24-hour pain behavior: worse later in day  History of prior back injury, pain, surgery, or therapy: Yes; remote hx of 2 PT visits in 2023 for back pain; no surgeries Dominant hand: right Imaging: Yes     EXAM: CERVICAL SPINE - COMPLETE 4+ VIEW   COMPARISON:  None Available.   FINDINGS: There is no evidence of cervical spine fracture or prevertebral soft tissue swelling. Straightening of the cervical lordosis. No listhesis. Disc heights are preserved. No significant facet arthropathy.   IMPRESSION: Straightening of the cervical lordosis. No significant degenerative changes.  ________________  CLINICAL DATA:  Lumbosacral local pain. Acute on chronic history.   EXAM: LUMBAR SPINE - COMPLETE 4+ VIEW   COMPARISON:  CT of the abdomen and pelvis 03/20/2015   FINDINGS: Five non rib-bearing lumbar type vertebral bodies are present. Slight retrolisthesis is again noted at L2-3. No other significant listhesis is present. Lumbar lordosis preserved. Mild facet hypertrophy at L5-S1 is worse right than left. Disc heights are maintained. Disc protrusions suspected at L4-5 and L5-S1.   IMPRESSION:  1. Stable slight retrolisthesis at L2-3. 2. Suspect disc protrusions at L4-5 and L5-S1. MRI of lumbar spine would be useful for further evaluation as clinically indicated.    Red flags: Negative for bowel/bladder changes, saddle paresthesia, personal history of cancer, h/o spinal tumors, h/o compression fx, h/o abdominal aneurysm, abdominal pain, chills/fever, night sweats, nausea,  vomiting, unrelenting pain, first onset of insidious LBP <20 y/o   PRECAUTIONS: None  WEIGHT BEARING RESTRICTIONS: No  FALLS: Has patient fallen in last 6 months? No  Living Environment Lives with: lives with their spouse and lives with their daughter Lives in: House/apartment Stairs: No steps to enter; stairs to access 2nd level  Has following equipment at home: None  Prior level of function: Independent  Occupational demands: Desk work - works for [employer]: Walking regimen; play volleyball; reading; singing   Patient Goals: Less pain    OBJECTIVE (data from initial evaluation unless otherwise dated):   Patient Surveys  NDI:  NECK DISABILITY INDEX  Date: 03/28/24 Score  Pain intensity 2 = The pain is moderate at the moment  2. Personal care (washing, dressing, etc.) 1 =  I can look after myself normally but it causes extra pain  3. Lifting 1 =  I can lift heavy weights but it gives extra pain  4. Reading 2 =  I can read as much as I want with moderate pain in my neck  5. Headaches 1 =  I have slight headaches, which come infrequently  6. Concentration 1 =  I can concentrate fully when I want to with slight difficulty   7. Work 1 =  I can only do my usual work, but no more  8. Driving 3 = I can't drive my car as long as I want because of moderate pain in my neck  9. Sleeping 3 =  My sleep is moderately disturbed (2-3 hrs sleepless)  10. Recreation 3 = I am able to engage in a few of my usual recreation activities because of pain in   my neck  Total 18/50    Cognition Patient is oriented to person, place, and time.  Recent memory is intact.  Remote memory is intact.  Attention span and concentration are intact.  Expressive speech is intact.  Patient's fund of knowledge is within normal limits for educational level.    Gross Musculoskeletal Assessment Tremor: None Bulk: Normal Tone: Normal No visible step-off along spinal column, no signs of scoliosis Sit to  stand: difficulty with moving from trunk flexion to neutral extension with UE assist on anterior thighs, notable pain behaviors related to chronic back pain  GAIT: Distance walked: 40 ft Assistive device utilized: None Level of assistance: Complete Independence Comments: Guarded posture, dec trunk rotation, dec gait velocity and stride length  Posture: Lumbar lordosis: Decreased  FHRS  Lumbar lateral shift: Negative  AROM Cervical flexion:  45 (tense posterior C-spine/periscaular) Cervical extension: 45  Lateral flexion: Right 37* , Left 35* Cervical rotation: Right 65, Left 60* *Indicates pain  AROM (Normal range in degrees) AROM  03/28/24  Lumbar   Flexion (65) 75%* (axial low back pain)  Extension (30) 50%*  Right lateral flexion (25) 75%* (low back, axial)  Left lateral flexion (25) 75%* (mild low back)  Right rotation (30) 50%*  Left rotation (30) 75%*      Hip Right Left  Flexion (125) WNL WNL  Extension (15)    Abduction (40)    Adduction  Internal Rotation (45)    External Rotation (45)        (* = pain; Blank rows = not tested)    MMT: UE MMT Shoulder flexion: 5/5 bilat Abduction: 5/5 bilat  Biceps: 5/5 bilat Triceps: 5/5 bilat Wrist extension: 5/5 bilat   LE MMT (out of 5) Right 03/28/24 Left 03/28/24  Hip flexion 5 5  Hip extension    Hip abduction    Hip adduction    Hip internal rotation    Hip external rotation    Knee flexion 5 5  Knee extension 5 5  Ankle dorsiflexion 5 5  Ankle plantarflexion    Ankle inversion    Ankle eversion    (* = pain; Blank rows = not tested)  Sensation Deferred  Reflexes Deferred  Muscle Length Hamstrings: R: Negative L: Negative E[place] (quadriceps): R: Not examined L: Not examined   Palpation Location Right Left         Lumbar paraspinals 1 1  Quadratus Lumborum    Gluteus Maximus 1 1  Gluteus Medius 1 1  Deep hip external rotators 1 1  PSIS 1 1  Fortin's Area (SIJ)    Greater  Trochanter 1 1  (Blank rows = not tested) Graded on 0-4 scale (0 = no pain, 1 = pain, 2 = pain with wincing/grimacing/flinching, 3 = pain with withdrawal, 4 = unwilling to allow palpation)  Passive Accessory  Motion Pain reproduced at restriction with CPA C3-6. Mild hypomobility C4-7; no notable pain in lower cervical region with CPA.   Special Tests Cervical Radiculopathy: Cervical compression/distraction: Positive Spurling's: Negative for radicular pain, localized neck pain for R and L  Lumbar Radiculopathy and Discogenic: Centralization and Peripheralization (SN 92, -LR 0.12):  Repeated cervical retraction, in supine; 2 x 10,1 sec hold  -mild pull and pressure in CT region and upper back during, no notable increase in pain after  Repeated lumbar extension in lying;  1 x 10  -pain in axial low back and moderate referral toward R flank Repeated lumbar flexion in lying; 1 x 10    -pain in low back and spasm  Slump (SN 83, -LR 0.32): R: Negative L: Negative SLR (SN 92, -LR 0.29): R: Negative L:  Negative  Lumbar Foraminal Stenosis: Lumbar quadrant (SN 70): R: Positive L: Positive     TODAY'S TREATMENT: DATE: 04/11/24 ***   SUBJECTIVE STATEMENT:   Pt reports notable HA at baseline - pt reports pain affecting left temporal region intermittently. Patient reports pain affecting occipital region and upper cervical region also. Patient reports some benefit transiently with use of traction techniques discussed last visit. Patient reports notable spasms affecting posterior cervical spine and lumbar paraspinal region. She presently has more pain affecting posterior cervical spine and upper trap region.     Manual Therapy - for symptom modulation, soft tissue sensitivity and mobility, joint mobility, ROM   Manual cervical traction in supine; 10 sec on, 10 sec off; x 5 minutes for nerve root decompression and symptom modulation  *Passive accessories updated -Pain reproduced at  restriction with CPA C3-6. Mild hypomobility C4-7; no notable pain in lower cervical region with CPA.  STM/DTM C3-6 splenius cervicis/capitis and bilateral upper traps; x 10 minutes  __________  Hooklying traction attempted with belt - stopped due to discomfort on calves General manual lumbar traction in supine with Mulligan belt; therapist at foot of patient; 10 sec on, 10 sec off x 5 minutes for nerve root decompression, pain control  Therapeutic Exercise - for improved soft tissue flexibility and extensibility as needed for ROM, repeated movement for symptom modulation and to improve ROM  Repeated motion testing:  Repeated cervical retraction, in supine; 2 x 10,1 sec hold  -mild pull and pressure in CT region and upper back during, no notable increase in pain after  ___________  Repeated lumbar extension in lying;  1 x 10  -pain in axial low back and moderate referral toward R flank Repeated lumbar flexion in lying; 1 x 10  -pain in low back and spasm   Lower trunk rotations, hooklying; 1 x 10 alt R/L  PATIENT EDUCATION: Reviewed baseline home exercises and discussed use of repeated movement program and stretching for C-spine. We elected to hold on L-spine repeated movement HEP due to unfavorable response for both flexion and extension. Alternative treatments discussed, such as paraspinal STM/DTM and potential use of dry needling for persistent pain. Med[employer] handout given for carryover of home exercises.   MHP (unbilled) utilized post-treatment in sitting for analgesic effect and improved soft tissue extensibility; hot pack draped along neck/upper traps and along low back in sitting position; x 5 minutes    PATIENT EDUCATION:  Education details: see above for patient education details Person educated: Patient Education method: Explanation, Demonstration, and Handouts Education comprehension: verbalized understanding and returned demonstration   HOME EXERCISE PROGRAM:   Access Code: [email or web address] URL: [email or web address] Date: 04/04/2024 Prepared by: Venetia Endo  Exercises - Supine Chin Tuck  - 5-6 x daily - 7 x weekly - 1 sets - 10 reps - 1 sec hold - Seated Upper Trapezius Stretch  - 2 x daily - 7 x weekly - 3 sets - 30sec hold - Seated Scapular Retraction  - 2 x daily - 7 x weekly - 2 sets - 10 reps - 3sec hold - Supine Lower Trunk Rotation  - 2 x daily - 7 x weekly - 2 sets - 10 reps - 2sec hold   ASSESSMENT:  CLINICAL IMPRESSION: ***  Patient has notable upper quarter pain at baseline today with good response to traction and C-spine paraspinal STM. We initiated repeated movement for cervical spine with no notable worsening of symptoms with repeated retraction and mild pain at rest in supine lying following repeated movement; we will continue f/u on response with repeated movement and stretching program at home. Pt has unfavorable response for both repeated flexion and extension for lumbar spine. We initiated simple LTR and instructed pt to continue with activity modification and self-traction techniques prn. We can continue with STM/DTM and consideration of dry needling for low back next visit; we will also work on addressing thoracolumbar mobility deficits and further update thoracic/lumbar passive accessories next visit. Pt has current deficits in thoracolumbar AROM, pain with accessing C-spine AROM, postural changes, TTP/sensitivity along R>L lumbar paraspinals and gluteal musculature. Pt will continue to benefit from skilled PT services to address deficits and improve function.   OBJECTIVE IMPAIRMENTS: Abnormal gait, difficulty walking, decreased ROM, hypomobility, increased muscle spasms, postural dysfunction, and pain.   ACTIVITY LIMITATIONS: carrying, lifting, bending, sitting, sleeping, transfers, bed mobility, and reach over head  PARTICIPATION LIMITATIONS: meal prep, cleaning, driving, community activity, and  occupation  PERSONAL FACTORS: Past/current experiences, Time since onset of injury/illness/exacerbation, and 3+ comorbidities: (chronic pain, HLD, prediabetes, lumbar spondylosis) are also affecting patient's functional outcome.   REHAB POTENTIAL: Good  CLINICAL DECISION MAKING: Evolving/moderate complexity  EVALUATION COMPLEXITY: Moderate   GOALS: Goals reviewed with patient? Yes  SHORT TERM GOALS: Target  date: 04/19/2024  Pt will be independent with HEP in order to improve strength and decrease back pain to improve pain-free function at home and work. Baseline: 03/28/24: Cervical/lumbar self-traction and activity modification strategies reviewed; formal HEP to be given on visit #2.  Goal status: INITIAL   LONG TERM GOALS: Target date: 05/24/2024  Patient will have full thoracolumbar AROM without reproduction of pain as needed for reaching items on ground, household chores, bending. Baseline: 03/28/24: Pain and motion loss in all directions (see chart above).  Goal status: INITIAL  2.  Pt will decrease worst neck/back pain by at least 2 points on the NPRS in order to demonstrate clinically significant reduction in back pain. Baseline: 03/28/24: 9/10 at worst.  Goal status: INITIAL  3.  Pt will decrease NDI score by at least 5 points/10% or greater indicative of clinically significant reduction in back pain/disability.       Baseline: 03/28/24: 18/50 Goal status: INITIAL  4.  Patient will have no increase in pain with accessing functional cervical spine AROM in all planes without reproduction of pain as needed for bending, scanning environment, and completing desk work. Baseline: 03/28/24: Pain with flexion, bilat lateral flexion, and L>R rotation with minimal motion loss. Goal status: INITIAL  5.  Patient will tolerate sitting up to half of her work shift prior to lunch break without reproduction of back/neck pain as needed for completion of computer/administrative duties.   Baseline: 03/28/24: Notable pain with prolonged sitting. Goal status: INITIAL   PLAN: PT FREQUENCY: 1-2x/week  PT DURATION: 8 weeks  PLANNED INTERVENTIONS: Therapeutic exercises, Therapeutic activity, Neuromuscular re-education, Balance training, Gait training, Patient/Family education, Self Care, Joint mobilization, Joint manipulation, Vestibular training, Canalith repositioning, Orthotic/Fit training, DME instructions, Dry Needling, Electrical stimulation, Spinal manipulation, Spinal mobilization, Cryotherapy, Moist heat, Taping, Traction, Ultrasound, Ionotophoresis 4mg /ml Dexamethasone , Manual therapy, and Re-evaluation.  PLAN FOR NEXT SESSION: Follow-up on HEP/repeated movement for cervical spine. Manual techniques/STM for cervical spine and lumbar spine. Graded movement as tolerated.  Further update thoracic/lumbar passive accessories next visit.   Maryanne Finder, PT, DPT Physical Therapist -   [facility] 04/11/2024, 7:46 AM

## 2024-04-13 ENCOUNTER — Other Ambulatory Visit: Payer: Self-pay | Admitting: Internal Medicine

## 2024-04-13 DIAGNOSIS — R000 Tachycardia, unspecified: Secondary | ICD-10-CM

## 2024-04-15 NOTE — Telephone Encounter (Signed)
 Requested Prescriptions  Pending Prescriptions Disp Refills   propranolol  (INDERAL ) 20 MG tablet [Pharmacy Med Name: PROPRANOLOL  20MG  TABLETS] 180 tablet 1    Sig: TAKE 1 TABLET(20 MG) BY MOUTH TWICE DAILY AS NEEDED     Cardiovascular:  Beta Blockers Passed - 04/15/2024 12:01 PM      Passed - Last BP in normal range    BP Readings from Last 1 Encounters:  02/15/24 106/74         Passed - Last Heart Rate in normal range    Pulse Readings from Last 1 Encounters:  02/15/24 91         Passed - Valid encounter within last 6 months    Recent Outpatient Visits           2 months ago Cervicalgia   [facility], Selinda PARAS, MD   4 months ago UTI symptoms   [facility], Leita DEL, MD   6 months ago Hypercalcemia   [facility] at [facility], Leita DEL, MD

## 2024-04-18 ENCOUNTER — Ambulatory Visit: Admitting: Family Medicine

## 2024-04-18 ENCOUNTER — Ambulatory Visit: Admitting: Physical Therapy

## 2024-04-18 ENCOUNTER — Encounter: Admitting: Internal Medicine

## 2024-04-18 DIAGNOSIS — M542 Cervicalgia: Secondary | ICD-10-CM

## 2024-04-18 DIAGNOSIS — M5459 Other low back pain: Secondary | ICD-10-CM | POA: Diagnosis not present

## 2024-04-18 NOTE — Therapy (Unsigned)
 OUTPATIENT PHYSICAL THERAPY TREATMENT   Patient Name: Brittany Baldwin MRN: [id number] DOB:Nov 23, 1970, 53 y.o., female Today's Date: 04/18/2024  END OF SESSION:  PT End of Session - 04/18/24 1421     Visit Number 3    Number of Visits 17    Date for Recertification  05/24/24    Authorization Type [id number]    PT Start Time 1421    PT Stop Time 1500    PT Time Calculation (min) 39 min    Activity Tolerance Patient tolerated treatment well    Behavior During Therapy [facility] for tasks assessed/performed             Past Medical History:  Diagnosis Date   Anemia    H/O    Bilateral temporomandibular joint pain 09/24/2021   COPD (chronic obstructive pulmonary disease) (HCC)    Dependent edema 08/05/2016   Dysrhythmia    SVT-HAD CARDIAC ABLATION DONE IN 2016 BUT PT STATES SHE STILL GETS INTERMITTENT TACHYCARDIC AND IS SYPMPTOMATIC WITH SOB, DIZZINESS DURING TACHY EPISODES (06-25-15)   Elevated parathyroid hormone 07/03/2016   Fibroid    GERD (gastroesophageal reflux disease)    Headache    H/O   Heart murmur    as child   History of fundoplication 12/14/2014   History of pulmonary embolus during pregnancy 05/16/2015   2001   Hoarseness, persistent 10/06/2017   Hypercalcemia 01/18/2017   PTH, PTHrP, 1,25 vit D and 25 vit D, Thyroid  and Serum free light chains - normal  Repeat Calcium  WNL   Hyperlipemia    Motion sickness    cars, boats   PE (pulmonary thromboembolism) (HCC) 2000   while on bed rest during pregnancy   Plantar fasciitis, bilateral 08/05/2016   PONV (postoperative nausea and vomiting)    Shortness of breath dyspnea    ASSOCIATED WITH TACHYCARDIA   Status post abdominal hysterectomy 07/02/2015   Status post TAH, bilateral salpingectomy. PATHOLOGY: Uterine fibroids and adenomyosis    SVT (supraventricular tachycardia)    Tachycardia    Past Surgical History:  Procedure Laterality Date   ABDOMINAL HYSTERECTOMY Bilateral 07/02/2015   Procedure: HYSTERECTOMY  ABDOMINAL WITH BS;  Surgeon: Gladis DELENA Dollar, MD;  Location: [facility];  Service: Gynecology;  Laterality: Bilateral;   APPENDECTOMY     BIOPSY N/A 05/24/2020   Procedure: BIOPSY;  Surgeon: Unk Corinn Skiff, MD;  Location: [facility];  Service: Endoscopy;  Laterality: N/A;   CARDIAC ELECTROPHYSIOLOGY STUDY AND ABLATION     CHOLECYSTECTOMY     COLONOSCOPY  2013   COLONOSCOPY WITH PROPOFOL  N/A 03/10/2017   Procedure: COLONOSCOPY WITH PROPOFOL ;  Surgeon: Unk Corinn Skiff, MD;  Location: [facility];  Service: Endoscopy;  Laterality: N/A;   CYSTOSCOPY Bilateral 07/02/2015   Procedure: CYSTOSCOPY;  Surgeon: Gladis DELENA Dollar, MD;  Location: [facility];  Service: Gynecology;  Laterality: Bilateral;   ESOPHAGEAL DILATION  03/10/2017   Procedure: ESOPHAGEAL DILATION;  Surgeon: Unk Corinn Skiff, MD;  Location: [facility];  Service: Endoscopy;;   ESOPHAGEAL MANOMETRY N/A 07/24/2021   Procedure: ESOPHAGEAL MANOMETRY (EM);  Surgeon: Shila Gustav GAILS, MD;  Location: [facility];  Service: Endoscopy;  Laterality: N/A;   ESOPHAGOGASTRODUODENOSCOPY N/A 03/10/2017   Procedure: ESOPHAGOGASTRODUODENOSCOPY (EGD);  Surgeon: Unk Corinn Skiff, MD;  Location: [facility];  Service: Endoscopy;  Laterality: N/A;   ESOPHAGOGASTRODUODENOSCOPY (EGD) WITH PROPOFOL  N/A 07/01/2019   Procedure: ESOPHAGOGASTRODUODENOSCOPY (EGD) WITH PROPOFOL ;  Surgeon: Unk Corinn Skiff, MD;  Location: [facility];  Service: Endoscopy;  Laterality: N/A;   ESOPHAGOGASTRODUODENOSCOPY (EGD) WITH PROPOFOL  N/A 05/24/2020   Procedure: ESOPHAGOGASTRODUODENOSCOPY (EGD) WITH PROPOFOL ;  Surgeon: Unk Corinn Skiff, MD;  Location: [facility];  Service: Endoscopy;  Laterality: N/A;   ESOPHAGOGASTRODUODENOSCOPY (EGD) WITH PROPOFOL  N/A 06/30/2023   Procedure: ESOPHAGOGASTRODUODENOSCOPY (EGD) WITH PROPOFOL  WITH BIOPSY;  Surgeon: Unk Corinn Skiff, MD;  Location: [facility];   Service: Endoscopy;  Laterality: N/A;   LAPAROTOMY N/A 07/11/2015   Procedure: hematoma evacuation;  Surgeon: Gladis DELENA Dollar, MD;  Location: [facility];  Service: Gynecology;  Laterality: N/A;   NISSEN FUNDOPLICATION     TUBAL LIGATION     Patient Active Problem List   Diagnosis Date Noted   Chronic musculoskeletal pain 02/17/2024   Cervicalgia 02/16/2024   Vitamin D  deficiency 10/06/2023   Prediabetes 12/30/2022   Mild intermittent asthma without complication 12/30/2022   Environmental and seasonal allergies 04/10/2022   Greater trochanteric pain syndrome of right lower extremity 07/25/2021   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   Spondylosis of 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 06/14/2021   Osteophyte of left hip 04/19/2020   Acanthosis nigricans 07/30/2018   Hypercalcemia 01/18/2017   Constipation by delayed colonic transit 08/05/2016   Vocal cord nodules 02/13/2016   S/P total hysterectomy 07/02/2015   Tachycardia 05/10/2015   Gastroesophageal reflux disease 03/28/2014   Hyperlipidemia, mixed 03/28/2014    PCP: Justus Leita DEL, MD  REFERRING PROVIDER: Alvia Selinda PARAS, MD  REFERRING DIAG:  M54.2 (ICD-10-CM) - Cervicalgia   M47.817 (ICD-10-CM) - Spondylosis of 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  RATIONALE FOR EVALUATION AND TREATMENT: Rehabilitation  THERAPY DIAG: Cervicalgia  Other low back pain  ONSET DATE: Past 6 months   FOLLOW-UP APPT SCHEDULED WITH REFERRING PROVIDER: Yes ; f/u with Dr. Alvia 04/25/24  PERTINENT HISTORY: Pt is a 53 year old female with referral for cervicalgia and low back pain with referring diagnosis of lumbosacral spondylosis.   Patient reports chronic back pain. She reports neck pain that travels into head/shoulders. Pt reports back pain that can travel as far as R>L hip. She reports more R-sided lower quarter pain.   Pt feels that prolonged sitting at work has made her  condition worse. Pt has to break up sitting time and tries to walk into another room as able. She reports sensation of spasm that requires her to lie down and let it pass. Pt works remote and does not take hour-long commute that she used to take. Atraumatic onset.   PAIN:    Pain Intensity: Present: 7/10, Best: 5/10, Worst: 9/10 Pain location: Low back, cervical paraspinal and occipital region  Pain Quality: ache; low back pain Radiating: Yes ; from back to hip; neck to shoulders  Numbness/Tingling: Yes; seldom gets paresthesias in paracervical region/axial region Focal Weakness: No Aggravating factors: turning head, looking up (head/neck); bending over, riding in car (especially prolonged), lying on stomach hurts low back Relieving factors: rubbing neck and shoulders 24-hour pain behavior: worse later in day  History of prior back injury, pain, surgery, or therapy: Yes; remote hx of 2 PT visits in 2023 for back pain; no surgeries Dominant hand: right Imaging: Yes     EXAM: CERVICAL SPINE - COMPLETE 4+ VIEW   COMPARISON:  None Available.   FINDINGS: There is no evidence of cervical spine fracture or prevertebral soft tissue swelling. Straightening of the cervical lordosis. No listhesis. Disc heights are preserved. No significant facet arthropathy.   IMPRESSION: Straightening of the cervical lordosis. No significant degenerative  changes.  ________________  CLINICAL DATA:  Lumbosacral local pain. Acute on chronic history.   EXAM: LUMBAR SPINE - COMPLETE 4+ VIEW   COMPARISON:  CT of the abdomen and pelvis 03/20/2015   FINDINGS: Five non rib-bearing lumbar type vertebral bodies are present. Slight retrolisthesis is again noted at L2-3. No other significant listhesis is present. Lumbar lordosis preserved. Mild facet hypertrophy at L5-S1 is worse right than left. Disc heights are maintained. Disc protrusions suspected at L4-5 and L5-S1.   IMPRESSION: 1. Stable slight  retrolisthesis at L2-3. 2. Suspect disc protrusions at L4-5 and L5-S1. MRI of lumbar spine would be useful for further evaluation as clinically indicated.    Red flags: Negative for bowel/bladder changes, saddle paresthesia, personal history of cancer, h/o spinal tumors, h/o compression fx, h/o abdominal aneurysm, abdominal pain, chills/fever, night sweats, nausea, vomiting, unrelenting pain, first onset of insidious LBP <20 y/o   PRECAUTIONS: None  WEIGHT BEARING RESTRICTIONS: No  FALLS: Has patient fallen in last 6 months? No  Living Environment Lives with: lives with their spouse and lives with their daughter Lives in: House/apartment Stairs: No steps to enter; stairs to access 2nd level  Has following equipment at home: None  Prior level of function: Independent  Occupational demands: Desk work - works for [employer]: Walking regimen; play volleyball; reading; singing   Patient Goals: Less pain    OBJECTIVE (data from initial evaluation unless otherwise dated):   Patient Surveys  NDI:  NECK DISABILITY INDEX  Date: 03/28/24 Score  Pain intensity 2 = The pain is moderate at the moment  2. Personal care (washing, dressing, etc.) 1 =  I can look after myself normally but it causes extra pain  3. Lifting 1 =  I can lift heavy weights but it gives extra pain  4. Reading 2 =  I can read as much as I want with moderate pain in my neck  5. Headaches 1 =  I have slight headaches, which come infrequently  6. Concentration 1 =  I can concentrate fully when I want to with slight difficulty   7. Work 1 =  I can only do my usual work, but no more  8. Driving 3 = I can't drive my car as long as I want because of moderate pain in my neck  9. Sleeping 3 =  My sleep is moderately disturbed (2-3 hrs sleepless)  10. Recreation 3 = I am able to engage in a few of my usual recreation activities because of pain in   my neck  Total 18/50    Cognition Patient is oriented to person,  place, and time.  Recent memory is intact.  Remote memory is intact.  Attention span and concentration are intact.  Expressive speech is intact.  Patient's fund of knowledge is within normal limits for educational level.    Gross Musculoskeletal Assessment Tremor: None Bulk: Normal Tone: Normal No visible step-off along spinal column, no signs of scoliosis Sit to stand: difficulty with moving from trunk flexion to neutral extension with UE assist on anterior thighs, notable pain behaviors related to chronic back pain  GAIT: Distance walked: 40 ft Assistive device utilized: None Level of assistance: Complete Independence Comments: Guarded posture, dec trunk rotation, dec gait velocity and stride length  Posture: Lumbar lordosis: Decreased  FHRS  Lumbar lateral shift: Negative  AROM Cervical flexion:  45 (tense posterior C-spine/periscaular) Cervical extension: 45  Lateral flexion: Right 37* , Left 35* Cervical rotation: Right  65, Left 60* *Indicates pain  AROM (Normal range in degrees) AROM  03/28/24  Lumbar   Flexion (65) 75%* (axial low back pain)  Extension (30) 50%*  Right lateral flexion (25) 75%* (low back, axial)  Left lateral flexion (25) 75%* (mild low back)  Right rotation (30) 50%*  Left rotation (30) 75%*      Hip Right Left  Flexion (125) WNL WNL  Extension (15)    Abduction (40)    Adduction     Internal Rotation (45)    External Rotation (45)        (* = pain; Blank rows = not tested)    MMT: UE MMT Shoulder flexion: 5/5 bilat Abduction: 5/5 bilat  Biceps: 5/5 bilat Triceps: 5/5 bilat Wrist extension: 5/5 bilat   LE MMT (out of 5) Right 03/28/24 Left 03/28/24  Hip flexion 5 5  Hip extension    Hip abduction    Hip adduction    Hip internal rotation    Hip external rotation    Knee flexion 5 5  Knee extension 5 5  Ankle dorsiflexion 5 5  Ankle plantarflexion    Ankle inversion    Ankle eversion    (* = pain; Blank rows = not  tested)  Sensation Deferred  Reflexes Deferred  Muscle Length Hamstrings: R: Negative L: Negative Ely (quadriceps): R: Not examined L: Not examined   Palpation Location Right Left         Lumbar paraspinals 1 1  Quadratus Lumborum    Gluteus Maximus 1 1  Gluteus Medius 1 1  Deep hip external rotators 1 1  PSIS 1 1  Fortin's Area (SIJ)    Greater Trochanter 1 1  (Blank rows = not tested) Graded on 0-4 scale (0 = no pain, 1 = pain, 2 = pain with wincing/grimacing/flinching, 3 = pain with withdrawal, 4 = unwilling to allow palpation)  Passive Accessory  Motion Pain reproduced at restriction with CPA C3-6. Mild hypomobility C4-7; no notable pain in lower cervical region with CPA.   Special Tests Cervical Radiculopathy: Cervical compression/distraction: Positive Spurling's: Negative for radicular pain, localized neck pain for R and L  Lumbar Radiculopathy and Discogenic: Centralization and Peripheralization (SN 92, -LR 0.12):  Repeated cervical retraction, in supine; 2 x 10,1 sec hold  -mild pull and pressure in CT region and upper back during, no notable increase in pain after  Repeated lumbar extension in lying;  1 x 10  -pain in axial low back and moderate referral toward R flank Repeated lumbar flexion in lying; 1 x 10    -pain in low back and spasm  Slump (SN 83, -LR 0.32): R: Negative L: Negative SLR (SN 92, -LR 0.29): R: Negative L:  Negative  Lumbar Foraminal Stenosis: Lumbar quadrant (SN 70): R: Positive L: Positive     TODAY'S TREATMENT: DATE: 04/18/2024   SUBJECTIVE STATEMENT:   Pt reports back pain is most prevalent at arrival to PT today. Patient reports no specific aggravating activity side from putting up Christmas tree. Pt reports 7-8/10 pain in low back today - she had pain in R hip over the weekend. Patient reports mild R hip pain at arrival to PT.     Manual Therapy - for symptom modulation, soft tissue sensitivity and mobility, joint mobility,  ROM   __________  General manual lumbar traction in supine with Mulligan belt; therapist at foot of patient; 10 sec on, 10 sec off x 5 minutes for nerve root decompression,  pain control  -no change in baseline pain  STM and IASTM with Hypervolt along L3-S1 bilateral erector spinae; x 10 minutes    *not today*  STM/DTM C3-6 splenius cervicis/capitis and bilateral upper traps; x 10 minutes   MHP (unbilled) utilized post-treatment in sitting for analgesic effect and improved soft tissue extensibility; hot pack draped along  along low back in prone position with 2 pillows under pelvis; x 5 minutes   Therapeutic Exercise - for improved soft tissue flexibility and extensibility as needed for ROM, repeated movement for symptom modulation and to improve ROM  __________  Lower trunk rotations, hooklying; 1 x 10 alt R/L Open book; 1 x 10 on each side  PATIENT EDUCATION: Reviewed baseline home exercises and discussed use of repeated movement program and stretching for C-spine. We elected to hold on L-spine repeated movement HEP due to unfavorable response for both flexion and extension. Alternative treatments discussed, such as paraspinal STM/DTM and potential use of dry needling for persistent pain. MedBridge handout given for carryover of home exercises.        *not today*  Repeated cervical retraction, in supine; 2 x 10,1 sec hold   PATIENT EDUCATION:  Education details: see above for patient education details Person educated: Patient Education method: Explanation, Demonstration, and Handouts Education comprehension: verbalized understanding and returned demonstration   HOME EXERCISE PROGRAM:  Access Code: [email or web address] URL: [email or web address] Date: 04/04/2024 Prepared by: Venetia Endo  Exercises - Supine Chin Tuck  - 5-6 x daily - 7 x weekly - 1 sets - 10 reps - 1 sec hold - Seated Upper Trapezius Stretch  - 2 x daily - 7 x weekly - 3 sets - 30sec hold - Seated  Scapular Retraction  - 2 x daily - 7 x weekly - 2 sets - 10 reps - 3sec hold - Supine Lower Trunk Rotation  - 2 x daily - 7 x weekly - 2 sets - 10 reps - 2sec hold   ASSESSMENT:  CLINICAL IMPRESSION: Patient has complaint of severe axial back pain at arrival with moderate referral to bilateral flank on either side. Pt has current deficits in thoracolumbar AROM, pain with accessing C-spine AROM, postural changes, TTP/sensitivity along R>L lumbar paraspinals and gluteal musculature. Pt will continue to benefit from skilled PT services to address deficits and improve function.   OBJECTIVE IMPAIRMENTS: Abnormal gait, difficulty walking, decreased ROM, hypomobility, increased muscle spasms, postural dysfunction, and pain.   ACTIVITY LIMITATIONS: carrying, lifting, bending, sitting, sleeping, transfers, bed mobility, and reach over head  PARTICIPATION LIMITATIONS: meal prep, cleaning, driving, community activity, and occupation  PERSONAL FACTORS: Past/current experiences, Time since onset of injury/illness/exacerbation, and 3+ comorbidities: (chronic pain, HLD, prediabetes, lumbar spondylosis) are also affecting patient's functional outcome.   REHAB POTENTIAL: Good  CLINICAL DECISION MAKING: Evolving/moderate complexity  EVALUATION COMPLEXITY: Moderate   GOALS: Goals reviewed with patient? Yes  SHORT TERM GOALS: Target date: 04/19/2024  Pt will be independent with HEP in order to improve strength and decrease back pain to improve pain-free function at home and work. Baseline: 03/28/24: Cervical/lumbar self-traction and activity modification strategies reviewed; formal HEP to be given on visit #2.  Goal status: INITIAL   LONG TERM GOALS: Target date: 05/24/2024  Patient will have full thoracolumbar AROM without reproduction of pain as needed for reaching items on ground, household chores, bending. Baseline: 03/28/24: Pain and motion loss in all directions (see chart above).  Goal  status: INITIAL  2.  Pt will decrease worst neck/back pain by at  least 2 points on the NPRS in order to demonstrate clinically significant reduction in back pain. Baseline: 03/28/24: 9/10 at worst.  Goal status: INITIAL  3.  Pt will decrease NDI score by at least 5 points/10% or greater indicative of clinically significant reduction in back pain/disability.       Baseline: 03/28/24: 18/50 Goal status: INITIAL  4.  Patient will have no increase in pain with accessing functional cervical spine AROM in all planes without reproduction of pain as needed for bending, scanning environment, and completing desk work. Baseline: 03/28/24: Pain with flexion, bilat lateral flexion, and L>R rotation with minimal motion loss. Goal status: INITIAL  5.  Patient will tolerate sitting up to half of her work shift prior to lunch break without reproduction of back/neck pain as needed for completion of computer/administrative duties.  Baseline: 03/28/24: Notable pain with prolonged sitting. Goal status: INITIAL   PLAN: PT FREQUENCY: 1-2x/week  PT DURATION: 8 weeks  PLANNED INTERVENTIONS: Therapeutic exercises, Therapeutic activity, Neuromuscular re-education, Balance training, Gait training, Patient/Family education, Self Care, Joint mobilization, Joint manipulation, Vestibular training, Canalith repositioning, Orthotic/Fit training, DME instructions, Dry Needling, Electrical stimulation, Spinal manipulation, Spinal mobilization, Cryotherapy, Moist heat, Taping, Traction, Ultrasound, Ionotophoresis 4mg /ml Dexamethasone , Manual therapy, and Re-evaluation.  PLAN FOR NEXT SESSION: Follow-up on HEP/repeated movement for cervical spine. Manual techniques/STM for cervical spine and lumbar spine. Graded movement as tolerated.  Further update thoracic/lumbar passive accessories next visit.   Venetia Endo, PT, DPT #[id number]  Venetia ONEIDA Endo, PT 04/18/2024, 2:21 PM

## 2024-04-20 ENCOUNTER — Encounter: Payer: Self-pay | Admitting: Physical Therapy

## 2024-04-25 ENCOUNTER — Ambulatory Visit: Admitting: Physical Therapy

## 2024-04-25 ENCOUNTER — Ambulatory Visit: Admitting: Family Medicine

## 2024-05-03 ENCOUNTER — Other Ambulatory Visit: Payer: Self-pay | Admitting: Family Medicine

## 2024-05-06 NOTE — Telephone Encounter (Signed)
 Requested medication (s) are due for refill today: Yes  Requested medication (s) are on the active medication list:Yes  Last refill:  03/27/24  Future visit scheduled: Yes  Notes to clinic:  unsure if pt will continue medication. Routing to provider     Requested Prescriptions  Pending Prescriptions Disp Refills   meloxicam  (MOBIC ) 15 MG tablet [Pharmacy Med Name: MELOXICAM  15MG  TABLETS] 60 tablet 0    Sig: TAKE 1 TABLET(15 MG) BY MOUTH DAILY WITH FOOD     Analgesics:  COX2 Inhibitors Failed - 05/06/2024 12:22 PM      Failed - Manual Review: Labs are only required if the patient has taken medication for more than 8 weeks.      Passed - HGB in normal range and within 360 days    Hemoglobin  Date Value Ref Range Status  10/05/2023 13.5 12.0 - 16.0 Final  09/03/2020 14.0 11.1 - 15.9 g/dL Final         Passed - Cr in normal range and within 360 days    Creatinine  Date Value Ref Range Status  10/05/2023 0.9 0.5 - 1.1 Final   Creatinine, Ser  Date Value Ref Range Status  09/03/2020 0.96 0.57 - 1.00 mg/dL Final         Passed - HCT in normal range and within 360 days    HCT  Date Value Ref Range Status  10/05/2023 40 36 - 46 Final   Hematocrit  Date Value Ref Range Status  09/03/2020 41.0 34.0 - 46.6 % Final         Passed - AST in normal range and within 360 days    AST  Date Value Ref Range Status  10/05/2023 23 13 - 35 Final         Passed - ALT in normal range and within 360 days    ALT  Date Value Ref Range Status  10/05/2023 17 7 - 35 U/L Final         Passed - eGFR is 30 or above and within 360 days    GFR calc Af Amer  Date Value Ref Range Status  06/29/2019 80 >59 mL/min/1.73 Final   GFR calc non Af Amer  Date Value Ref Range Status  06/29/2019 70 >59 mL/min/1.73 Final   eGFR  Date Value Ref Range Status  10/05/2023 82  Final  09/03/2020 72 >59 mL/min/1.73 Final         Passed - Patient is not pregnant      Passed - Valid encounter within  last 12 months    Recent Outpatient Visits           2 months ago Cervicalgia   [facility], Selinda PARAS, MD   5 months ago UTI symptoms   [facility] at [facility], Leita DEL, MD   7 months ago Hypercalcemia   [facility] at [facility], Leita DEL, MD

## 2024-05-09 ENCOUNTER — Ambulatory Visit: Admitting: Family Medicine

## 2024-05-09 ENCOUNTER — Encounter: Payer: Self-pay | Admitting: Family Medicine

## 2024-05-09 VITALS — BP 108/60 | HR 84 | Ht 66.0 in | Wt 232.0 lb

## 2024-05-09 DIAGNOSIS — M47817 Spondylosis without myelopathy or radiculopathy, lumbosacral region: Secondary | ICD-10-CM

## 2024-05-09 DIAGNOSIS — M542 Cervicalgia: Secondary | ICD-10-CM

## 2024-05-09 MED ORDER — TIZANIDINE HCL 2 MG PO TABS: 2.0000 mg | ORAL_TABLET | Freq: Three times a day (TID) | ORAL | 0 refills | Status: AC | PRN

## 2024-05-10 ENCOUNTER — Other Ambulatory Visit: Payer: Self-pay

## 2024-05-10 DIAGNOSIS — R42 Dizziness and giddiness: Secondary | ICD-10-CM

## 2024-05-10 NOTE — Telephone Encounter (Signed)
 Completed. JM

## 2024-05-10 NOTE — Telephone Encounter (Signed)
 Please review Dr. Alvia note from 05/09/24 and place referral for Neurologist. She has seen Dr. Gregg in the past (2023). Thank you.  JM

## 2024-05-10 NOTE — Patient Instructions (Signed)
 VISIT SUMMARY:  During your visit, we discussed your chronic neck and back pain, which has not improved with physical therapy thus far. We also touched on your headaches and dizziness. We have planned further imaging and recommended you seek out specialist referrals to better understand and manage your symptoms.  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 -Continue taking meloxicam  once daily to control inflammation. -We have ordered an MRI of your lumbar spine to assess your current condition and guide your physical therapy. -Continue physical therapy with a focus on strengthening your core muscles to stabilize your spine. -A muscle relaxer has been prescribed for severe flare-ups. -If physical therapy and medication are not enough, we may consider an epidural steroid injection.  CHRONIC NECK PAIN WITH ASSOCIATED CERVICOGENIC HEADACHE AND DIZZINESS: You have chronic neck pain that is causing headaches and dizziness, possibly related to issues in your cervical spine. -We have ordered an MRI of your cervical spine to assess your current condition and guide your treatment. -Continue taking meloxicam  once daily to control inflammation. -You should schedule a follow-up with your prior neurologist for evaluation of your dizziness and headaches. -If the neurology evaluation suggests inner ear involvement, your PCP may refer you to an ENT specialist. -Continue physical therapy with a focus on neck stabilization exercises.

## 2024-05-10 NOTE — Progress Notes (Signed)
 Primary Care / Sports Medicine Office Visit  Patient Information:  Patient ID: Brittany Baldwin, female DOB: 08/29/1970 Age: 53 y.o. MRN: [id number]   Brittany Baldwin is a pleasant 53 y.o. female presenting with the following:  Chief Complaint  Patient presents with   Neck Pain    Patient presents today for a follow up on her neck pain. Neck continues to be stiff and painful even though she has taken Meloxicam  and had physical therapy. She has tension in the back of her head and feels like the neck pain radiates to her jaw line.   Back Pain    Low back continues and last Wednesday she bent down and back locked up on her. She could not twist or bend. Her back is a little better than it was last week but she continues to have muscles spasms occasionally. She would like to discuss different medication or treatment options.     Vitals:   05/09/24 1549  BP: 108/60  Pulse: 84  SpO2: 99%   Vitals:   05/09/24 1549  Weight: 232 lb (105.2 kg)  Height: 5' 6 (1.676 m)   Body mass index is 37.45 kg/m.  No results found.   Discussed the use of AI scribe software for clinical note transcription with the patient, who gave verbal consent to proceed.   Independent interpretation of notes and tests performed by another provider:   None  Procedures performed:   None  Pertinent History, Exam, Impression, and Recommendations:   History of Present Illness Brittany Baldwin is a 53 year old female who presents with worsening chronic neck and back pain and limited relief from physical therapy.  Axial spine pain and spasm - Chronic neck and back pain ongoing since at least September - Physical therapy once weekly since September provided no significant relief; exercises were standard and ineffective - Acute severe back spasm last Wednesday while bending to pick up clothes, resulting in significant pain and immobility - Improvement in back pain by Friday after use of meloxicam , allowing travel over  the weekend - Currently taking meloxicam  at night for symptom management - Avoids muscle relaxers due to side effects - No numbness or tingling in arms or legs  Cervical radicular symptoms and headache - Persistent neck pain radiating to the back of the head - Intermittent, throbbing 'icicle' headaches on the left side - History of dizziness and sensation of blood rushing to the head; unclear if related to neck or possible inner ear etiology - History of neurologist evaluation for similar symptoms, including dizziness and headaches in 2023 - MRI in July 2023 was normal  Prior imaging and findings - Lumbar spine x-rays two years ago showed listhesis - No recent imaging performed  Results RADIOLOGY Cervical spine X-ray: Alignment and intervertebral spaces appear normal. No significant abnormalities noted. Lumbar spine X-ray: Listhesis observed at multiple levels with anterolisthesis of vertebrae. No significant disc space narrowing noted.  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 Current physical therapy minimally effective however we discussed expected timelines and both non-surgical and surgical options alongside expectations. Meloxicam  provided partial relief. Conservative management preferred. MRI needed to optimize physical therapy and consider future interventions. - Continue meloxicam  once daily for inflammation control. - Ordered MRI of the lumbar spine to assess current status and guide physical therapy. - Continue physical therapy focusing on core muscle  strengthening to stabilize the spine. - Prescribed muscle relaxer for severe flare-ups. - Consider epidural steroid injection if physical therapy and medication are inadequate.  Chronic neck pain with associated cervicogenic headache and dizziness Chronic neck pain with  cervicogenic headaches and dizziness, possibly related to cervical spine issues. Dizziness may be related to cervical spine issues or neurological / ENT etiology. Conservative management preferred. Neurology and ENT evaluation recommended - she has seen Dr Gregg in the past and I have advised her to reach out to them for follow-up. She would need to touch abse with her PCP Dr. Justus if needing ENT / other referral(s). - Ordered MRI of the cervical spine to assess current status and guide treatment. - Continue meloxicam  once daily for inflammation control. - Referred to neurology for evaluation of dizziness and headaches. - Consider ENT referral if neurology evaluation suggests inner ear involvement. Contact PCP if this is the case. - Continue physical therapy focusing on neck stabilization exercises.  Problem List Items Addressed This Visit     Cervicalgia - Primary   Relevant Medications   tiZANidine  (ZANAFLEX ) 2 MG tablet   Spondylosis of 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   Relevant Medications   tiZANidine  (ZANAFLEX ) 2 MG tablet   A total of 44 minutes was spent on the date of service, 05/10/2024, encompassing both face-to-face and non-face-to-face time. This included review of prior records and imaging (e.g., MRI and/or radiographs), medical chart review, information gathering, documentation, care coordination with clinic staff, discussion and counseling with the patient regarding clinical findings and treatment options, and planning for follow-up and next steps in management.  Specifically we reviewed multifactorial etiologies behind her symptoms, particularly headaches and disequilibrium.  Additionally discussed both surgical and nonsurgical options at length in addition to realistic timelines for expected symptom response and next steps in treatment algorithm.   Orders & Medications Medications:  Meds ordered this encounter  Medications   tiZANidine  (ZANAFLEX ) 2  MG tablet    Sig: Take 1-2 tablets (2-4 mg total) by mouth every 8 (eight) hours as needed for muscle spasms.    Dispense:  30 tablet    Refill:  0   No orders of the defined types were placed in this encounter.    No follow-ups on file.     Selinda JINNY Ku, MD, [facility]   Primary Care Sports Medicine Primary Care and Sports Medicine at [facility]

## 2024-05-10 NOTE — Telephone Encounter (Signed)
 Please review Dr. Arvin message above  JM

## 2024-05-12 ENCOUNTER — Encounter: Payer: Self-pay | Admitting: Neurology

## 2024-05-12 ENCOUNTER — Telehealth: Payer: Self-pay | Admitting: Neurology

## 2024-05-12 ENCOUNTER — Ambulatory Visit: Admitting: Neurology

## 2024-05-12 ENCOUNTER — Encounter: Admitting: Internal Medicine

## 2024-05-12 VITALS — BP 121/86 | HR 79 | Ht 66.0 in | Wt 227.0 lb

## 2024-05-12 DIAGNOSIS — R404 Transient alteration of awareness: Secondary | ICD-10-CM

## 2024-05-12 DIAGNOSIS — F4321 Adjustment disorder with depressed mood: Secondary | ICD-10-CM

## 2024-05-12 DIAGNOSIS — F411 Generalized anxiety disorder: Secondary | ICD-10-CM

## 2024-05-12 NOTE — Patient Instructions (Addendum)
 Continue current medications Referral to therapy for management of anxiety and grief Continue follow-up PCP Return as needed

## 2024-05-12 NOTE — Progress Notes (Signed)
 [facility]  PATIENT: Brittany Baldwin DOB: January 25, 1971  REQUESTING CLINICIAN: Alvia Selinda PARAS, MD HISTORY FROM: Patient REASON FOR VISIT: Chart review    HISTORICAL  CHIEF COMPLAINT:  Chief Complaint  Patient presents with   RM 12    Consult: Disequilibrium; alone    HISTORY OF PRESENT ILLNESS:  Discussed the use of AI scribe software for clinical note transcription with the patient, who gave verbal consent to proceed.  Brittany Baldwin is a 53 year old female with history of hypertension, cervicalgia, anxiety who presents with symptoms of increased stress, lightheadedness, palpitations, headaches, and neck tension.  She reports experiencing significant stress following the loss of eight people, including family members and friends, since March, and describes physical symptoms including palpitations and a sensation of blood rushing to her head.  Denies any vertigo feeling  She experiences headaches described as 'ice pick' headaches, characterized by a throbbing, shooting pain that is intermittent and not excruciating. She also reports tension in her neck and back, for which she is undergoing physical therapy. Additionally, she feels pressure behind her eyes and experiences brain fog, described as feeling 'off'.  She has a history of a previous MRI scan that showed nonspecific flare hyperintensities. She is concerned about her family history, noting strokes in her mother and aunts, and Parkinson's in her grandfather.  She experiences tremors, mainly in one hand, and sometimes in the other, occurring at random times. She also experiences spasms in her eye and occasional mispronunciation of words.  She has a history of bruxism.   OTHER MEDICAL CONDITIONS: Hypertension, obesity, anxiety   REVIEW OF SYSTEMS: Full 14 system review of systems performed and negative with exception of: As noted in the HPI  ALLERGIES: Allergies[1]  HOME MEDICATIONS: Outpatient  Medications Prior to Visit  Medication Sig Dispense Refill   atorvastatin  (LIPITOR) 10 MG tablet TAKE 1 TABLET(10 MG) BY MOUTH DAILY 90 tablet 1   Cholecalciferol (VITAMIN D -3) 125 MCG (5000 UT) TABS Take by mouth.     meloxicam  (MOBIC ) 15 MG tablet TAKE 1 TABLET(15 MG) BY MOUTH DAILY WITH FOOD 60 tablet 0   nadolol (CORGARD) 40 MG tablet Take 40 mg by mouth 2 (two) times daily.     propranolol  (INDERAL ) 20 MG tablet TAKE 1 TABLET(20 MG) BY MOUTH TWICE DAILY AS NEEDED 180 tablet 1   tiZANidine  (ZANAFLEX ) 2 MG tablet Take 1-2 tablets (2-4 mg total) by mouth every 8 (eight) hours as needed for muscle spasms. 30 tablet 0   UNABLE TO FIND Med Name: Nutrafol women's vitamin     DRYSOL 20 % external solution Apply topically 2 (two) times daily.     estradiol (ESTRACE) 0.1 MG/GM vaginal cream Place 1 Applicatorful vaginally.     No facility-administered medications prior to visit.    PAST MEDICAL HISTORY: Past Medical History:  Diagnosis Date   Anemia    H/O    Bilateral temporomandibular joint pain 09/24/2021   COPD (chronic obstructive pulmonary disease) (HCC)    Dependent edema 08/05/2016   Dysrhythmia    SVT-HAD CARDIAC ABLATION DONE IN 2016 BUT PT STATES SHE STILL GETS INTERMITTENT TACHYCARDIC AND IS SYPMPTOMATIC WITH SOB, DIZZINESS DURING TACHY EPISODES (06-25-15)   Elevated parathyroid hormone 07/03/2016   Fibroid    GERD (gastroesophageal reflux disease)    Headache    H/O   Heart murmur    as child   History of fundoplication 12/14/2014   History of pulmonary embolus during pregnancy 05/16/2015   2001  Hoarseness, persistent 10/06/2017   Hypercalcemia 01/18/2017   PTH, PTHrP, 1,25 vit D and 25 vit D, Thyroid  and Serum free light chains - normal  Repeat Calcium  WNL   Hyperlipemia    Motion sickness    cars, boats   PE (pulmonary thromboembolism) (HCC) 2000   while on bed rest during pregnancy   Plantar fasciitis, bilateral 08/05/2016   PONV (postoperative nausea and  vomiting)    Shortness of breath dyspnea    ASSOCIATED WITH TACHYCARDIA   Status post abdominal hysterectomy 07/02/2015   Status post TAH, bilateral salpingectomy. PATHOLOGY: Uterine fibroids and adenomyosis    SVT (supraventricular tachycardia)    Tachycardia     PAST SURGICAL HISTORY: Past Surgical History:  Procedure Laterality Date   ABDOMINAL HYSTERECTOMY Bilateral 07/02/2015   Procedure: HYSTERECTOMY ABDOMINAL WITH BS;  Surgeon: Gladis DELENA Dollar, MD;  Location: [facility];  Service: Gynecology;  Laterality: Bilateral;   APPENDECTOMY     BIOPSY N/A 05/24/2020   Procedure: BIOPSY;  Surgeon: Unk Corinn Skiff, MD;  Location: [facility];  Service: Endoscopy;  Laterality: N/A;   CARDIAC ELECTROPHYSIOLOGY STUDY AND ABLATION     CHOLECYSTECTOMY     COLONOSCOPY  2013   COLONOSCOPY WITH PROPOFOL  N/A 03/10/2017   Procedure: COLONOSCOPY WITH PROPOFOL ;  Surgeon: Unk Corinn Skiff, MD;  Location: [facility];  Service: Endoscopy;  Laterality: N/A;   CYSTOSCOPY Bilateral 07/02/2015   Procedure: CYSTOSCOPY;  Surgeon: Gladis DELENA Dollar, MD;  Location: [facility];  Service: Gynecology;  Laterality: Bilateral;   ESOPHAGEAL DILATION  03/10/2017   Procedure: ESOPHAGEAL DILATION;  Surgeon: Unk Corinn Skiff, MD;  Location: [facility];  Service: Endoscopy;;   ESOPHAGEAL MANOMETRY N/A 07/24/2021   Procedure: ESOPHAGEAL MANOMETRY (EM);  Surgeon: Shila Gustav GAILS, MD;  Location: [facility];  Service: Endoscopy;  Laterality: N/A;   ESOPHAGOGASTRODUODENOSCOPY N/A 03/10/2017   Procedure: ESOPHAGOGASTRODUODENOSCOPY (EGD);  Surgeon: Unk Corinn Skiff, MD;  Location: [facility];  Service: Endoscopy;  Laterality: N/A;   ESOPHAGOGASTRODUODENOSCOPY (EGD) WITH PROPOFOL  N/A 07/01/2019   Procedure: ESOPHAGOGASTRODUODENOSCOPY (EGD) WITH PROPOFOL ;  Surgeon: Unk Corinn Skiff, MD;  Location: [facility];  Service: Endoscopy;  Laterality: N/A;    ESOPHAGOGASTRODUODENOSCOPY (EGD) WITH PROPOFOL  N/A 05/24/2020   Procedure: ESOPHAGOGASTRODUODENOSCOPY (EGD) WITH PROPOFOL ;  Surgeon: Unk Corinn Skiff, MD;  Location: [facility];  Service: Endoscopy;  Laterality: N/A;   ESOPHAGOGASTRODUODENOSCOPY (EGD) WITH PROPOFOL  N/A 06/30/2023   Procedure: ESOPHAGOGASTRODUODENOSCOPY (EGD) WITH PROPOFOL  WITH BIOPSY;  Surgeon: Unk Corinn Skiff, MD;  Location: [facility];  Service: Endoscopy;  Laterality: N/A;   LAPAROTOMY N/A 07/11/2015   Procedure: hematoma evacuation;  Surgeon: Gladis DELENA Dollar, MD;  Location: [facility];  Service: Gynecology;  Laterality: N/A;   NISSEN FUNDOPLICATION     TUBAL LIGATION      FAMILY HISTORY: Family History  Problem Relation Age of Onset   Diabetes Mother    Prostate cancer Father    Congestive Heart Failure Father    Heart failure Father    Cancer Neg Hx     SOCIAL HISTORY: Social History   Socioeconomic History   Marital status: Married    Spouse name: Achol Azpeitia   Number of children: Not on file   Years of education: Not on file   Highest education level: Not on file  Occupational History   Not on file  Tobacco Use   Smoking status: Never   Smokeless tobacco: Never  Vaping Use   Vaping status: Never Used  Substance and Sexual Activity   Alcohol use: Not Currently   Drug use: Never   Sexual activity: Yes    Partners: Male    Birth control/protection: Surgical  Other Topics Concern   Not on file  Social History Narrative   Not on file   Social Drivers of Health   Tobacco Use: Low Risk (05/12/2024)   Patient History    Smoking Tobacco Use: Never    Smokeless Tobacco Use: Never    Passive Exposure: Not on file  Financial Resource Strain: Not on file  Food Insecurity: No Food Insecurity (10/06/2023)   Hunger Vital Sign    Worried About Running Out of Food in the Last Year: Never true    Ran Out of Food in the Last Year: Never true  Transportation Needs: No  Transportation Needs (10/06/2023)   PRAPARE - Administrator, Civil Service (Medical): No    Lack of Transportation (Non-Medical): No  Physical Activity: Not on file  Stress: Not on file  Social Connections: Not on file  Intimate Partner Violence: Not At Risk (10/05/2023)   Received from [facility]   Epic    Within the last year, have you been afraid of your partner or ex-partner?: No    Within the last year, have you been humiliated or emotionally abused in other ways by your partner or ex-partner?: No    Within the last year, have you been kicked, hit, slapped, or otherwise physically hurt by your partner or ex-partner?: No    Within the last year, have you been raped or forced to have any kind of sexual activity by your partner or ex-partner?: No  Depression (PHQ2-9): Low Risk (05/09/2024)   Depression (PHQ2-9)    PHQ-2 Score: 0  Alcohol Screen: Not on file  Housing: Unknown (10/08/2023)   Received from [facility]   Epic    Unable to Pay for Housing in the Last Year: Not on file    Number of Times Moved in the Last Year: Not on file    At any time in the past 12 months, were you homeless or living in a shelter (including now)?: No  Utilities: Not At Risk (10/06/2023)   [facility] Utilities    Threatened with loss of utilities: No  Health Literacy: Not on file    PHYSICAL EXAM  GENERAL EXAM/CONSTITUTIONAL: Vitals:  Vitals:   05/12/24 1351  BP: 121/86  Pulse: 79  Weight: 227 lb (103 kg)  Height: 5' 6 (1.676 m)   Body mass index is 36.64 kg/m. Wt Readings from Last 3 Encounters:  05/12/24 227 lb (103 kg)  05/09/24 232 lb (105.2 kg)  02/15/24 229 lb (103.9 kg)   Patient is in no distress; well developed, nourished and groomed; neck is supple  MUSCULOSKELETAL: Gait, strength, tone, movements noted in Neurologic exam below  NEUROLOGIC: MENTAL STATUS:      No data to display         awake, alert, oriented to person, place and time recent  and remote memory intact normal attention and concentration language fluent, comprehension intact, naming intact fund of knowledge appropriate  CRANIAL NERVE:  2nd, 3rd, 4th, 6th - Visual fields full to confrontation, extraocular muscles intact, no nystagmus 5th - facial sensation symmetric 7th - facial strength symmetric 8th - hearing intact 9th - palate elevates symmetrically, uvula midline 11th - shoulder shrug symmetric 12th - tongue protrusion midline  MOTOR:  normal bulk and tone, full strength  in the BUE, BLE  SENSORY:  normal and symmetric to light touch  GAIT/STATION:  normal     DIAGNOSTIC DATA (LABS, IMAGING, TESTING) - I reviewed patient records, labs, notes, testing and imaging myself where available.  Lab Results  Component Value Date   WBC 6.1 10/05/2023   HGB 13.5 10/05/2023   HCT 40 10/05/2023   MCV 87 09/03/2020   PLT 218 10/05/2023      Component Value Date/Time   NA 140 10/05/2023 0000   K 4.8 10/05/2023 0000   CL 7 (A) 10/05/2023 0000   CO2 25 (A) 10/05/2023 0000   GLUCOSE 103 (H) 09/03/2020 0954   GLUCOSE 125 (H) 12/05/2015 2027   BUN 9 10/05/2023 0000   CREATININE 0.9 10/05/2023 0000   CREATININE 0.96 09/03/2020 0954   CALCIUM  10.7 (H) 10/06/2023 1630   PROT 6.6 09/03/2020 0954   ALBUMIN 4.6 09/03/2020 0954   AST 23 10/05/2023 0000   ALT 17 10/05/2023 0000   ALKPHOS 65 10/05/2023 0000   BILITOT 0.7 09/03/2020 0954   GFRNONAA 70 06/29/2019 0943   GFRAA 80 06/29/2019 0943   Lab Results  Component Value Date   CHOL 149 09/03/2020   HDL 50 09/03/2020   LDLCALC 86 09/03/2020   TRIG 64 09/03/2020   CHOLHDL 3.0 09/03/2020   Lab Results  Component Value Date   HGBA1C 6.4 10/05/2023   No results found for: VITAMINB12 Lab Results  Component Value Date   TSH 3.00 10/05/2023    MRI Brain wo contrast 12/12/2021 No evidence of recent infarction, hemorrhage, or mass.   Routine EEG 11/14/2021 Normal    ASSESSMENT AND PLAN  52  y.o. year old female with    Generalized anxiety disorder with unresolved grief Symptoms include palpitations, brain fog, word-finding difficulties, and enhanced physiological tremor, exacerbated by recent bereavements and stress. No evidence of Parkinson's disease or multiple sclerosis based on previous MRI and clinical evaluation. Stress is a significant contributing factor. - Referred to therapist for evaluation and management of anxiety and grief. - Encouraged participation in at least one therapy session to assess benefit. - Discussed potential benefits of therapy.  Bruxism with temporomandibular joint arthralgia Bruxism contributes to jaw pain and tension-type headaches. Stress exacerbates symptoms. Current use of a non-custom mouth guard is insufficient. - Recommended obtaining a custom mouth guard from a dentist. - Advised use of mouth guard to prevent dental damage and reduce jaw pain.  Tension-type and primary stabbing headaches Headaches include tension-type and primary stabbing headaches, possibly related to stress and bruxism. Symptoms include pressure behind the eyes and intermittent stabbing pain. - Address stress management to potentially reduce headache frequency and severity.  Physiological tremor Intermittent tremor in the hands, likely exacerbated by stress and anxiety. No evidence of Parkinson's disease based on clinical evaluation and previous MRI findings. - Continue to monitor tremor symptoms and assess for any changes or worsening.     1. Altered sensorium   2. Generalized anxiety disorder   3. Grief      Patient Instructions  Continue current medications Referral to therapy for management of anxiety and grief Continue follow-up PCP Return as needed  Orders Placed This Encounter  Procedures   Ambulatory referral to Psychology    No orders of the defined types were placed in this encounter.   Return if symptoms worsen or fail to improve.  I  personally spent a total of 50 minutes in the care of the patient today including preparing to see  the patient, getting/reviewing separately obtained history, performing a medically appropriate exam/evaluation, counseling and educating, placing orders, documenting clinical information in the EHR, independently interpreting results, communicating results, and discussing the benefits from therapy for management of anxiety and grief.   Pastor Falling, MD 05/12/2024, 2:43 PM  [facility] [address], [address] [place], [place] [address] [phone]     [1]  Allergies Allergen Reactions   Codeine Shortness Of Breath   Aspirin Other (See Comments)    Stomach issues   Diphenhydramine Hcl Other (See Comments)    Through an I.V. Drip in addition to other meds. Caused hallucinations   Adhesive [Tape] Rash    ELECTRODES FROM HOLTER MONITOR CAUSED RASH   Hydrocodone Rash

## 2024-05-12 NOTE — Telephone Encounter (Signed)
 Referral for Psychology sent thru epic to [facility] at [address]  [facility] at [employer] [phone]

## 2024-05-18 ENCOUNTER — Ambulatory Visit

## 2024-05-23 ENCOUNTER — Ambulatory Visit
Admission: RE | Admit: 2024-05-23 | Discharge: 2024-05-23 | Disposition: A | Discharge status: Home / Self Care | Source: Ambulatory Visit | Attending: Family Medicine | Admitting: Family Medicine

## 2024-05-23 ENCOUNTER — Ambulatory Visit

## 2024-05-23 DIAGNOSIS — M5021 Other cervical disc displacement,  high cervical region: Secondary | ICD-10-CM | POA: Diagnosis not present

## 2024-05-23 DIAGNOSIS — M50221 Other cervical disc displacement at C4-C5 level: Secondary | ICD-10-CM | POA: Diagnosis not present

## 2024-05-23 DIAGNOSIS — M47817 Spondylosis without myelopathy or radiculopathy, lumbosacral region: Secondary | ICD-10-CM | POA: Diagnosis not present

## 2024-05-23 DIAGNOSIS — M48061 Spinal stenosis, lumbar region without neurogenic claudication: Secondary | ICD-10-CM | POA: Diagnosis not present

## 2024-05-23 DIAGNOSIS — M5126 Other intervertebral disc displacement, lumbar region: Secondary | ICD-10-CM | POA: Diagnosis not present

## 2024-05-23 DIAGNOSIS — M542 Cervicalgia: Secondary | ICD-10-CM | POA: Insufficient documentation

## 2024-05-23 DIAGNOSIS — M50222 Other cervical disc displacement at C5-C6 level: Secondary | ICD-10-CM | POA: Diagnosis not present

## 2024-05-23 DIAGNOSIS — M4802 Spinal stenosis, cervical region: Secondary | ICD-10-CM | POA: Diagnosis not present

## 2024-06-01 ENCOUNTER — Encounter: Admitting: Internal Medicine

## 2024-06-01 NOTE — Progress Notes (Deleted)
 "   Date:  06/01/2024   Name:  Brittany Baldwin   DOB:  Feb 10, 1971   MRN:  [id number]   Chief Complaint: No chief complaint on file. Brittany Baldwin is a 53 y.o. female who presents today for her Complete Annual Exam. She feels {DESC; WELL/FAIRLY WELL/POORLY:18703}. She reports exercising ***. She reports she is sleeping {DESC; WELL/FAIRLY WELL/POORLY:18703}. Breast complaints ***.  Health Maintenance  Topic Date Due   HIV Screening  Never done   DTaP/Tdap/Td vaccine (1 - Tdap) Never done   Pneumococcal Vaccine for age over 43 (1 of 2 - PCV) Never done   Hepatitis B Vaccine (1 of 3 - 19+ 3-dose series) Never done   Zoster (Shingles) Vaccine (1 of 2) Never done   Flu Shot  Never done   COVID-19 Vaccine (4 - 2025-26 season) 02/01/2024   Breast Cancer Screening  02/24/2024   Colon Cancer Screening  03/11/2027   Hepatitis C Screening  Completed   HPV Vaccine  Aged Out   Meningitis B Vaccine  Aged Out    HPI  Review of Systems   Lab Results  Component Value Date   NA 140 10/05/2023   K 4.8 10/05/2023   CO2 25 (A) 10/05/2023   GLUCOSE 103 (H) 09/03/2020   BUN 9 10/05/2023   CREATININE 0.9 10/05/2023   CALCIUM  10.7 (H) 10/06/2023   EGFR 82 10/05/2023   GFRNONAA 70 06/29/2019   Lab Results  Component Value Date   CHOL 149 09/03/2020   HDL 50 09/03/2020   LDLCALC 86 09/03/2020   TRIG 64 09/03/2020   CHOLHDL 3.0 09/03/2020   Lab Results  Component Value Date   TSH 3.00 10/05/2023   Lab Results  Component Value Date   HGBA1C 6.4 10/05/2023   Lab Results  Component Value Date   WBC 6.1 10/05/2023   HGB 13.5 10/05/2023   HCT 40 10/05/2023   MCV 87 09/03/2020   PLT 218 10/05/2023   Lab Results  Component Value Date   ALT 17 10/05/2023   AST 23 10/05/2023   ALKPHOS 65 10/05/2023   BILITOT 0.7 09/03/2020   Lab Results  Component Value Date   VD25OH 53.7 10/06/2023     Patient Active Problem List   Diagnosis Date Noted   Chronic musculoskeletal pain 02/17/2024    Cervicalgia 02/16/2024   Vitamin D  deficiency 10/06/2023   Prediabetes 12/30/2022   Mild intermittent asthma without complication 12/30/2022   Environmental and seasonal allergies 04/10/2022   Greater trochanteric pain syndrome of right lower extremity 07/25/2021   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   Spondylosis of 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 06/14/2021   Osteophyte of left hip 04/19/2020   Acanthosis nigricans 07/30/2018   Hypercalcemia 01/18/2017   Constipation by delayed colonic transit 08/05/2016   Vocal cord nodules 02/13/2016   S/P total hysterectomy 07/02/2015   Tachycardia 05/10/2015   Gastroesophageal reflux disease 03/28/2014   Hyperlipidemia, mixed 03/28/2014    Allergies[1]  Past Surgical History:  Procedure Laterality Date   ABDOMINAL HYSTERECTOMY Bilateral 07/02/2015   Procedure: HYSTERECTOMY ABDOMINAL WITH BS;  Surgeon: Gladis DELENA Dollar, MD;  Location: [facility];  Service: Gynecology;  Laterality: Bilateral;   APPENDECTOMY     BIOPSY N/A 05/24/2020   Procedure: BIOPSY;  Surgeon: Unk Corinn Skiff, MD;  Location: [facility];  Service: Endoscopy;  Laterality: N/A;   CARDIAC ELECTROPHYSIOLOGY STUDY AND ABLATION     CHOLECYSTECTOMY     COLONOSCOPY  2013  COLONOSCOPY WITH PROPOFOL  N/A 03/10/2017   Procedure: COLONOSCOPY WITH PROPOFOL ;  Surgeon: Unk Corinn Skiff, MD;  Location: [facility];  Service: Endoscopy;  Laterality: N/A;   CYSTOSCOPY Bilateral 07/02/2015   Procedure: CYSTOSCOPY;  Surgeon: Gladis DELENA Dollar, MD;  Location: [facility];  Service: Gynecology;  Laterality: Bilateral;   ESOPHAGEAL DILATION  03/10/2017   Procedure: ESOPHAGEAL DILATION;  Surgeon: Unk Corinn Skiff, MD;  Location: [facility];  Service: Endoscopy;;   ESOPHAGEAL MANOMETRY N/A 07/24/2021   Procedure: ESOPHAGEAL MANOMETRY (EM);  Surgeon: Shila Gustav GAILS, MD;  Location: [facility];  Service: Endoscopy;   Laterality: N/A;   ESOPHAGOGASTRODUODENOSCOPY N/A 03/10/2017   Procedure: ESOPHAGOGASTRODUODENOSCOPY (EGD);  Surgeon: Unk Corinn Skiff, MD;  Location: [facility];  Service: Endoscopy;  Laterality: N/A;   ESOPHAGOGASTRODUODENOSCOPY (EGD) WITH PROPOFOL  N/A 07/01/2019   Procedure: ESOPHAGOGASTRODUODENOSCOPY (EGD) WITH PROPOFOL ;  Surgeon: Unk Corinn Skiff, MD;  Location: [facility];  Service: Endoscopy;  Laterality: N/A;   ESOPHAGOGASTRODUODENOSCOPY (EGD) WITH PROPOFOL  N/A 05/24/2020   Procedure: ESOPHAGOGASTRODUODENOSCOPY (EGD) WITH PROPOFOL ;  Surgeon: Unk Corinn Skiff, MD;  Location: [facility];  Service: Endoscopy;  Laterality: N/A;   ESOPHAGOGASTRODUODENOSCOPY (EGD) WITH PROPOFOL  N/A 06/30/2023   Procedure: ESOPHAGOGASTRODUODENOSCOPY (EGD) WITH PROPOFOL  WITH BIOPSY;  Surgeon: Unk Corinn Skiff, MD;  Location: [facility];  Service: Endoscopy;  Laterality: N/A;   LAPAROTOMY N/A 07/11/2015   Procedure: hematoma evacuation;  Surgeon: Gladis DELENA Dollar, MD;  Location: [facility];  Service: Gynecology;  Laterality: N/A;   NISSEN FUNDOPLICATION     TUBAL LIGATION      Social History[2]   Medication list has been reviewed and updated.  Active Medications[3]     10/06/2023    3:58 PM 12/30/2022    4:26 PM 07/09/2022    2:22 PM 09/24/2021    4:47 PM  GAD 7 : Generalized Anxiety Score  Nervous, Anxious, on Edge 0 0 0 0  Control/stop worrying 0 0 0 0  Worry too much - different things 0 0 0 0  Trouble relaxing 0 0 0 0  Restless 0 0 0 0  Easily annoyed or irritable 0 0 0 0  Afraid - awful might happen 0 0 0 0  Total GAD 7 Score 0 0 0 0  Anxiety Difficulty Not difficult at all Not difficult at all Not difficult at all        05/09/2024    3:53 PM 10/06/2023    3:56 PM 12/30/2022    4:25 PM  Depression screen PHQ 2/9  Decreased Interest 0 0 0  Down, Depressed, Hopeless 0 0 0  PHQ - 2 Score 0 0 0  Altered sleeping  1 3  Tired, decreased energy  1 3   Change in appetite  0 0  Feeling bad or failure about yourself   0 0  Trouble concentrating  0 0  Moving slowly or fidgety/restless  0 0  Suicidal thoughts  0 0  PHQ-9 Score  2  6   Difficult doing work/chores  Not difficult at all Not difficult at all     Data saved with a previous flowsheet row definition    BP Readings from Last 3 Encounters:  05/12/24 121/86  05/09/24 108/60  02/15/24 106/74    Physical Exam  Wt Readings from Last 3 Encounters:  05/12/24 227 lb (103 kg)  05/09/24 232 lb (105.2 kg)  02/15/24 229 lb (103.9 kg)    LMP 06/26/2015   Assessment and Plan:  Problem List Items Addressed This Visit   None   No follow-ups on file.    Leita HILARIO Adie, MD  [facility] [place]           [1]  Allergies Allergen Reactions   Codeine Shortness Of Breath   Aspirin Other (See Comments)    Stomach issues   Diphenhydramine Hcl Other (See Comments)    Through an I.V. Drip in addition to other meds. Caused hallucinations   Adhesive [Tape] Rash    ELECTRODES FROM HOLTER MONITOR CAUSED RASH   Hydrocodone Rash  [2]  Social History Tobacco Use   Smoking status: Never   Smokeless tobacco: Never  Vaping Use   Vaping status: Never Used  Substance Use Topics   Alcohol use: Not Currently   Drug use: Never  [3]  No outpatient medications have been marked as taking for the 06/01/24 encounter (Appointment) with Adie Leita DEL, MD.   "

## 2024-06-06 ENCOUNTER — Ambulatory Visit: Payer: Self-pay | Admitting: Family Medicine

## 2024-07-04 ENCOUNTER — Ambulatory Visit: Admitting: Student

## 2024-07-05 ENCOUNTER — Ambulatory Visit: Admitting: Student

## 2024-07-05 ENCOUNTER — Encounter: Payer: Self-pay | Admitting: Student

## 2024-07-05 VITALS — BP 124/76 | HR 76 | Temp 97.7°F | Ht 66.0 in | Wt 228.0 lb

## 2024-07-05 DIAGNOSIS — E210 Primary hyperparathyroidism: Secondary | ICD-10-CM | POA: Insufficient documentation

## 2024-07-05 DIAGNOSIS — R0789 Other chest pain: Secondary | ICD-10-CM

## 2024-07-06 NOTE — Assessment & Plan Note (Signed)
 Calcium  has been high with inappropriately normal PTH, has seen endo, no indication for surgery currently, will have yearly monitoring labs with endo

## 2024-07-07 ENCOUNTER — Telehealth: Payer: Self-pay | Admitting: Student

## 2024-07-07 NOTE — Telephone Encounter (Signed)
 Spoke with patient she needs new order for cervical and lumbar spine and to include massage therapy in the order.

## 2024-07-07 NOTE — Telephone Encounter (Signed)
 Copied from CRM [id number]. Topic: Referral - Question >> Jul 07, 2024 12:18 PM Avram MATSU wrote: Reason for CRM: patient would need a referral to start PT as a new patient, was seen there months ago. Place referral in workflow.

## 2024-07-11 ENCOUNTER — Ambulatory Visit: Admitting: Physical Therapy

## 2024-07-18 ENCOUNTER — Ambulatory Visit: Admitting: Physical Therapy

## 2024-07-25 ENCOUNTER — Ambulatory Visit: Admitting: Physical Therapy
